# Patient Record
Sex: Male | Born: 1975 | Race: White | Hispanic: No | Marital: Married | Smoking: Former smoker
Health system: Southern US, Community
[De-identification: ages and names within clinical notes are randomized; demographics above are authoritative.]

## PROBLEM LIST (undated history)

## (undated) DIAGNOSIS — E1142 Type 2 diabetes mellitus with diabetic polyneuropathy: Secondary | ICD-10-CM

## (undated) DIAGNOSIS — T7840XA Allergy, unspecified, initial encounter: Secondary | ICD-10-CM

## (undated) DIAGNOSIS — K219 Gastro-esophageal reflux disease without esophagitis: Secondary | ICD-10-CM

## (undated) DIAGNOSIS — E785 Hyperlipidemia, unspecified: Secondary | ICD-10-CM

## (undated) DIAGNOSIS — M10079 Idiopathic gout, unspecified ankle and foot: Secondary | ICD-10-CM

## (undated) DIAGNOSIS — G43909 Migraine, unspecified, not intractable, without status migrainosus: Secondary | ICD-10-CM

## (undated) DIAGNOSIS — J309 Allergic rhinitis, unspecified: Secondary | ICD-10-CM

## (undated) HISTORY — DX: Idiopathic gout, unspecified ankle and foot: M10.079

## (undated) HISTORY — PX: ANKLE SURGERY: SHX546

## (undated) HISTORY — PX: CARPAL TUNNEL RELEASE: SHX101

## (undated) HISTORY — PX: SHOULDER SURGERY: SHX246

## (undated) HISTORY — DX: Allergic rhinitis, unspecified: J30.9

## (undated) HISTORY — DX: Gastro-esophageal reflux disease without esophagitis: K21.9

## (undated) HISTORY — PX: KNEE ARTHROSCOPY W/ ACL RECONSTRUCTION AND PATELLA GRAFT: SHX1861

## (undated) HISTORY — DX: Type 2 diabetes mellitus with diabetic polyneuropathy: E11.42

## (undated) HISTORY — DX: Allergy, unspecified, initial encounter: T78.40XA

## (undated) HISTORY — DX: Hyperlipidemia, unspecified: E78.5

## (undated) HISTORY — PX: GANGLION CYST EXCISION: SHX1691

## (undated) HISTORY — DX: Migraine, unspecified, not intractable, without status migrainosus: G43.909

---

## 1998-06-02 HISTORY — PX: APPENDECTOMY: SHX54

## 1999-02-06 ENCOUNTER — Inpatient Hospital Stay (HOSPITAL_COMMUNITY): Admission: EM | Admit: 1999-02-06 | Discharge: 1999-02-10 | Payer: Self-pay

## 1999-02-06 ENCOUNTER — Encounter (INDEPENDENT_AMBULATORY_CARE_PROVIDER_SITE_OTHER): Payer: Self-pay | Admitting: Specialist

## 1999-02-06 ENCOUNTER — Encounter (HOSPITAL_BASED_OUTPATIENT_CLINIC_OR_DEPARTMENT_OTHER): Payer: Self-pay | Admitting: General Surgery

## 1999-02-12 ENCOUNTER — Emergency Department (HOSPITAL_COMMUNITY): Admission: EM | Admit: 1999-02-12 | Discharge: 1999-02-12 | Payer: Self-pay | Admitting: Emergency Medicine

## 2004-09-14 ENCOUNTER — Emergency Department (HOSPITAL_COMMUNITY): Admission: EM | Admit: 2004-09-14 | Discharge: 2004-09-14 | Payer: Self-pay | Admitting: Family Medicine

## 2004-09-24 ENCOUNTER — Encounter: Payer: Self-pay | Admitting: Family Medicine

## 2004-11-20 ENCOUNTER — Ambulatory Visit (HOSPITAL_BASED_OUTPATIENT_CLINIC_OR_DEPARTMENT_OTHER): Admission: RE | Admit: 2004-11-20 | Discharge: 2004-11-20 | Payer: Self-pay | Admitting: Orthopedic Surgery

## 2004-11-20 ENCOUNTER — Ambulatory Visit (HOSPITAL_COMMUNITY): Admission: RE | Admit: 2004-11-20 | Discharge: 2004-11-20 | Payer: Self-pay | Admitting: Orthopedic Surgery

## 2004-12-25 ENCOUNTER — Ambulatory Visit (HOSPITAL_BASED_OUTPATIENT_CLINIC_OR_DEPARTMENT_OTHER): Admission: RE | Admit: 2004-12-25 | Discharge: 2004-12-25 | Payer: Self-pay | Admitting: Orthopedic Surgery

## 2004-12-25 ENCOUNTER — Ambulatory Visit (HOSPITAL_COMMUNITY): Admission: RE | Admit: 2004-12-25 | Discharge: 2004-12-25 | Payer: Self-pay | Admitting: Orthopedic Surgery

## 2005-07-30 ENCOUNTER — Encounter: Admission: RE | Admit: 2005-07-30 | Discharge: 2005-07-30 | Payer: Self-pay | Admitting: Family Medicine

## 2007-01-31 ENCOUNTER — Emergency Department (HOSPITAL_COMMUNITY): Admission: EM | Admit: 2007-01-31 | Discharge: 2007-01-31 | Payer: Self-pay | Admitting: Family Medicine

## 2009-06-02 HISTORY — PX: KNEE SURGERY: SHX244

## 2009-07-31 ENCOUNTER — Encounter: Admission: RE | Admit: 2009-07-31 | Discharge: 2009-07-31 | Payer: Self-pay | Admitting: Internal Medicine

## 2009-09-05 ENCOUNTER — Ambulatory Visit (HOSPITAL_COMMUNITY): Admission: RE | Admit: 2009-09-05 | Discharge: 2009-09-05 | Payer: Self-pay | Admitting: Occupational Medicine

## 2010-06-24 ENCOUNTER — Encounter: Payer: Self-pay | Admitting: Family Medicine

## 2010-08-08 ENCOUNTER — Ambulatory Visit (INDEPENDENT_AMBULATORY_CARE_PROVIDER_SITE_OTHER): Payer: PRIVATE HEALTH INSURANCE | Admitting: Family Medicine

## 2010-08-08 ENCOUNTER — Encounter: Payer: Self-pay | Admitting: Family Medicine

## 2010-08-08 DIAGNOSIS — K219 Gastro-esophageal reflux disease without esophagitis: Secondary | ICD-10-CM | POA: Insufficient documentation

## 2010-08-08 DIAGNOSIS — M545 Low back pain, unspecified: Secondary | ICD-10-CM | POA: Insufficient documentation

## 2010-08-08 DIAGNOSIS — E1142 Type 2 diabetes mellitus with diabetic polyneuropathy: Secondary | ICD-10-CM

## 2010-08-08 DIAGNOSIS — J309 Allergic rhinitis, unspecified: Secondary | ICD-10-CM | POA: Insufficient documentation

## 2010-08-08 DIAGNOSIS — G43909 Migraine, unspecified, not intractable, without status migrainosus: Secondary | ICD-10-CM | POA: Insufficient documentation

## 2010-08-08 DIAGNOSIS — M549 Dorsalgia, unspecified: Secondary | ICD-10-CM

## 2010-08-08 DIAGNOSIS — E119 Type 2 diabetes mellitus without complications: Secondary | ICD-10-CM

## 2010-08-08 DIAGNOSIS — J45909 Unspecified asthma, uncomplicated: Secondary | ICD-10-CM | POA: Insufficient documentation

## 2010-08-08 HISTORY — DX: Type 2 diabetes mellitus with diabetic polyneuropathy: E11.42

## 2010-08-12 ENCOUNTER — Encounter: Payer: Self-pay | Admitting: Family Medicine

## 2010-08-13 NOTE — Letter (Signed)
Summary: [facility] Records  [facility] Records   Imported By: Beau Fanny 08/06/2010 09:15:43  _____________________________________________________________________  External Attachment:    Type:   Image     Comment:   External Document

## 2010-08-13 NOTE — Assessment & Plan Note (Signed)
Summary: NEW PT TO EST/DIABETES/CLE  [facility],MAILED NPP   Vital Signs:  Patient profile:   35 year old male Height:      67.5 inches Weight:      241.25 pounds BMI:     37.36 Temp:     98.8 degrees F oral Pulse rate:   76 / minute Pulse rhythm:   regular BP sitting:   120 / 78  (left arm) Cuff size:   regular  Vitals Entered By: Benny Lennert CMA Duncan Dull) (August 08, 2010 2:01 PM)  History of Present Illness: Chief complaint new patient to be established   DM: new onset, dx 2 weeks ago went to MD, Dr. Clovis Riley for back pain eval, taking Voltaren. found glucose in urine then BS > 500. Now on Metformin 500 mg by mouth two times a day and attempting to alter his diet.  diagnosed with DM 2 weeks ago.  Saw Lupe Carney  a lot of urine glucose, called around 4 and a1c at 563. 190-300  Back, pain, improving somewhat, taking Voltaren now bothering him little.  s/p recent knee surgery by Dr. Yisroel Ramming  Preventive Screening-Counseling & Management  Alcohol-Tobacco     Smoking Status: never  Caffeine-Diet-Exercise     Does Patient Exercise: no      Drug Use:  no.    Allergies (verified): 1)  ! Codeine  Past History:  Past Medical History: ALLERGIC RHINITIS (ICD-477.9) GERD (ICD-530.81) MIGRAINE HEADACHE (ICD-346.90) DM (ICD-250.00) ASTHMA (ICD-493.90)    Past Surgical History: knee surgery 2011 [facility]) Carpal tunnel  appendix 2000  Family History: Family History of Alcoholism/Addiction Family History Depression Family History Diabetes 1st degree relative Family History of Cardiovascular disorder  Social History: Occupation: Married Never Smoked Alcohol use-yes Drug use-no Regular exercise-no Occupation:  employed Smoking Status:  never Drug Use:  no Does Patient Exercise:  no  Review of Systems General:  Complains of fatigue; denies chills and fever. GI:  Complains of nausea. GU:  Complains of urinary frequency. Endo:  Complains of  excessive thirst, excessive urination, and polyuria.  Physical Exam  Additional Exam:  GEN: WDWN, NAD, Non-toxic, A & O x 3 HEENT: Atraumatic, Normocephalic. Neck supple. No masses, No LAD. Ears and Nose: No external deformity. CV: RRR, No M/G/R. No JVD. No thrill. No extra heart sounds. PULM: CTA B, no wheezes, crackles, rhonchi. No retractions. No resp. distress. No accessory muscle use. EXTR: No c/c/e NEURO: Normal gait.  PSYCH: Normally interactive. Conversant. Not depressed or anxious appearing.  Calm demeanor.     Impression & Recommendations:  Problem # 1:  DM (ICD-250.00) Assessment New >45 minutes spent in total face to face time with the patient with >50% of time spent in counselling and coordination of care: spent more than half of visit discussing and counselling he and his wife regarding new onset DM. Diet, pathophysiology, ramifications and risks long term all discussed. A1c, vaccines, eye exams reviewed. Referral to DM teaching.   We will obtain records from the patient's prior physicians: Dr. Clovis Riley and Dr. Yisroel Ramming.  His updated medication list for this problem includes:    Metformin Hcl 1000 Mg Tabs (Metformin hcl) .Marland Kitchen... 1 by mouth two times a day  Orders: Diabetic Clinic Referral (Diabetic)  Problem # 2:  BACK PAIN (ICD-724.5) Assessment: New  His updated medication list for this problem includes:    Diclofenac Sodium 75 Mg Tbec (Diclofenac sodium) .Marland Kitchen... Take one tablet two times a day  Complete Medication List: 1)  Diclofenac Sodium 75 Mg Tbec (Diclofenac sodium) .... Take one tablet two times a day 2)  Metformin Hcl 1000 Mg Tabs (Metformin hcl) .Marland Kitchen.. 1 by mouth two times a day 3)  Generic Glucometer, Dx 250.00  .... Check bs two times a day, fasting and once 2 hours after eating 4)  Generic Lancets, 250.00  .... Check bs two times a day, fasting and 1 hour after eating 5)  Generic Test Strips, 250.00  .... Check bs two times a day, fasting and 2 hours after  a meal  Patient Instructions: 1)  METFORMIN: TAKE 2 IN THE MORNING AND 1 AT NIGHT: FOR 2 WEEKS 2)  THEN: TAKE 2 TABS IN THE MORNING AND 2 TABS AT NIGHT. 3)  f/u 1 mo Prescriptions: GENERIC TEST STRIPS, 250.00 Check BS two times a day, fasting and 2 hours after a meal  #100 x 11   Entered and Authorized by:   Hannah Beat MD   Signed by:   Hannah Beat MD on 08/08/2010   Method used:   Print then Give to Patient   RxID:   [id number] GENERIC LANCETS, 250.00 Check BS two times a day, fasting and 1 hour after eating  #100 x 11   Entered and Authorized by:   Hannah Beat MD   Signed by:   Hannah Beat MD on 08/08/2010   Method used:   Print then Give to Patient   RxID:   [id number] GENERIC GLUCOMETER, DX 250.00 Check BS two times a day, fasting and once 2 hours after eating  #1 x 0   Entered and Authorized by:   Hannah Beat MD   Signed by:   Hannah Beat MD on 08/08/2010   Method used:   Print then Give to Patient   RxID:   [id number] METFORMIN HCL 1000 MG TABS (METFORMIN HCL) 1 by mouth two times a day  #60 x 2   Entered and Authorized by:   Hannah Beat MD   Signed by:   Hannah Beat MD on 08/08/2010   Method used:   Print then Give to Patient   RxID:   [id number]    Orders Added: 1)  Diabetic Clinic Referral [Diabetic] 2)  New Patient Level IV [01601]    Current Allergies (reviewed today): ! CODEINE

## 2010-08-20 NOTE — Letter (Signed)
Summary: [facility]   [facility]   Imported By: Kassie Mends 08/13/2010 10:10:50  _____________________________________________________________________  External Attachment:    Type:   Image     Comment:   External Document

## 2010-08-20 NOTE — Letter (Signed)
Summary: [facility]   [facility]   Imported By: Kassie Mends 08/13/2010 09:27:47  _____________________________________________________________________  External Attachment:    Type:   Image     Comment:   External Document

## 2010-08-29 NOTE — Letter (Signed)
Summary: [facility]   Imported By: Kassie Mends 08/19/2010 11:42:02  _____________________________________________________________________  External Attachment:    Type:   Image     Comment:   External Document

## 2010-09-11 ENCOUNTER — Encounter: Payer: Self-pay | Admitting: Family Medicine

## 2010-09-12 ENCOUNTER — Encounter: Payer: Self-pay | Admitting: Family Medicine

## 2010-09-12 ENCOUNTER — Ambulatory Visit (INDEPENDENT_AMBULATORY_CARE_PROVIDER_SITE_OTHER): Payer: PRIVATE HEALTH INSURANCE | Admitting: Family Medicine

## 2010-09-12 VITALS — BP 140/82 | HR 74 | Temp 97.9°F | Ht 66.0 in | Wt 236.1 lb

## 2010-09-12 DIAGNOSIS — R030 Elevated blood-pressure reading, without diagnosis of hypertension: Secondary | ICD-10-CM

## 2010-09-12 DIAGNOSIS — E119 Type 2 diabetes mellitus without complications: Secondary | ICD-10-CM

## 2010-09-12 MED ORDER — GLIPIZIDE 5 MG PO TABS: 5.0000 mg | ORAL_TABLET | Freq: Two times a day (BID) | ORAL | Status: DC

## 2010-09-12 NOTE — Patient Instructions (Signed)
F/u 3 months  Labs a few days before 

## 2010-09-12 NOTE — Progress Notes (Signed)
35 year old male:  Feeling a lot better. Not able to exercise at all. Cut out potatoes, bread, candy, chocolate, grilled and baked.   DM teaching - could not go (cost)  BP, 110/70 on my recheck, has machine at home and normal there  Diabetes Mellitus: Tolerating Medications: Compliance with diet: good Exercise: minimal, some walking Avg blood sugars at home: 120-220 Foot problems: none Hypoglycemia: none No nausea, vomitting, blurred vision, polyuria.  Elevated BP: 140 systolic, but normal on my recheck  The PMH, PSH, Social History, Family History, Medications, and allergies have been reviewed in [facility], and have been updated if relevant.  ROS: GEN: No acute illnesses, no fevers, chills. GI: No n/v/d, eating normally Pulm: No SOB Interactive and getting along well at home.  Otherwise, ROS is as per the HPI.  GEN: WDWN, NAD, Non-toxic, A & O x 3 HEENT: Atraumatic, Normocephalic. Neck supple. No masses, No LAD. Ears and Nose: No external deformity. CV: RRR, No M/G/R. No JVD. No thrill. No extra heart sounds. PULM: CTA B, no wheezes, crackles, rhonchi. No retractions. No resp. distress. No accessory muscle use. EXTR: No c/c/e NEURO Normal gait.  PSYCH: Normally interactive. Conversant. Not depressed or anxious appearing.  Calm demeanor.   A/P: DM: improved, not at goal, add glipizide, recheck labs in 3 mo and f/u 2. Elevated BP, keep log at home and bring in at next f/u ov

## 2010-10-18 NOTE — Op Note (Signed)
Lonnie Jones, Lonnie Jones                ACCOUNT NO.:  [id number]   MEDICAL RECORD NO.:  [id number]          PATIENT TYPE:  AMB   LOCATION:  DSC                          FACILITY:  [facility]   PHYSICIAN:  Loreta Ave, M.D. DATE OF BIRTH:  04-20-1976   DATE OF PROCEDURE:  12/25/2004  DATE OF DISCHARGE:                                 OPERATIVE REPORT   PREOPERATIVE DIAGNOSIS:  Right carpal tunnel syndrome.   POSTOPERATIVE DIAGNOSIS:  Right carpal tunnel syndrome.   OPERATION:  Right carpal tunnel release.   SURGEON:  Loreta Ave, M.D.   ASSISTANT:  Genene Churn. Denton Meek.   ANESTHESIA:  General.   ESTIMATED BLOOD LOSS:  Minimal.   TOURNIQUET TIME:  Was 40 minutes.   SPECIMENS:  None.   CULTURES:  None.   COMPLICATIONS:  None.   DRESSING:  A soft compressive bulky hand dressing and splint.   DESCRIPTION OF PROCEDURE:  The patient was brought to the operating room,  and after adequate anesthesia had been obtained, the tourniquet was applied  to the upper aspect of the right arm.  He was prepped and draped in the  usual sterile fashion.  Exsanguinated with the elevation of the Esmarch  tourniquet, inflated to 250 mmHg.  An incision in the volar aspect of the  wrist hitting slightly ulnarward at the distal wrist crease.  The skin and  subcutaneous tissue divided.  The retinaculum over the carpal tunnel was  identified and incised under direct visualization from the forearm fascia  proximally to the palmar arch distally.  The contents of the carpal tunnel  inspected.  Moderate constriction of the median nerve.  The mid-portion  improved with the carpal tunnel release and an epineurotomy.  No other  abnormalities in the canal.  The digital branches and motor branches were  identified and protected and decompressed.  Release was adequate throughout.  The wound was irrigated.  The skin was closed  with nylon.  The margins of the wound injected with Marcaine.  A sterile  compressive dressing was applied.  A bulky hand dressing and splint applied.  The tourniquet was deflated and removed.  The anesthesia was reversed.   The patient was brought to the recovery room, having tolerated the surgery  well with no complications.       DFM/MEDQ  D:  12/25/2004  T:  12/25/2004  Job:  [id number]

## 2010-10-18 NOTE — Op Note (Signed)
NAMEKRYSTIAN, Lonnie Jones                ACCOUNT NO.:  [id number]   MEDICAL RECORD NO.:  [id number]          PATIENT TYPE:  AMB   LOCATION:  DSC                          FACILITY:  [facility]   PHYSICIAN:  Loreta Ave, M.D. DATE OF BIRTH:  [birth year]   DATE OF PROCEDURE:  11/20/2004  DATE OF DISCHARGE:                                 OPERATIVE REPORT   PREOPERATIVE DIAGNOSIS:  Left carpal tunnel syndrome.   POSTOPERATIVE DIAGNOSIS:  Left carpal tunnel syndrome.   PROCEDURE:  Left carpal tunnel release.   SURGEON:  Loreta Ave, M.D.   ASSISTANT:  Zonia Kief, P.A.   ANESTHESIA:  General.   BLOOD LOSS:  Minimal.   TOURNIQUET TIME:  35 minutes.   SPECIMENS:  None.   CULTURE:  None.   COMPLICATIONS:  None.   DRESSINGS:  Soft compressive with a bulky hand dressing and splint.   PROCEDURE:  The patient brought to the operating room and placed on the  operating table in the supine position.  After adequate anesthesia had been  obtained, tourniquet applied to upper aspect of the left arm.  Prepped and  draped in the usual sterile fashion.  Exsanguinated with elevation of  Esmarch, tourniquet inflated to 250 mmHg.  A small incision over the carpal  tunnel extending slightly ulnarward at the distal wrist crease.  Skin and  subcutaneous tissue divided.  Retinaculum over the carpal tunnel identified  and incised under direct visualization from the forearm fascia proximally to  the palmar arch distally.  Contents of the canal inspected.  Marked  hourglass constriction over a length of about 1.5 cm on the median nerve  right in the middle of the canal.  Improved somewhat with carpal tunnel  release and epineurotomy but this still had a fair amount of erythema and  narrowing even after those procedures.  Digital branch, motor branch  identified, protected, decompressed.  Contents of the canal were inspected.  No other significant findings.  Wound irrigated.  Skin closed with  interrupted mattress nylon suture.  Margins of wound  injected with Marcaine.  A sterile compressive dressing applied.  A bulky  dressing and splint applied.  Tourniquet deflated and removed.  Anesthesia  reversed.  Brought to the recovery room.  Tolerated surgery well, no  complications.       DFM/MEDQ  D:  11/20/2004  T:  11/20/2004  Job:  [id number]

## 2010-11-01 ENCOUNTER — Encounter: Payer: Self-pay | Admitting: Internal Medicine

## 2010-11-01 ENCOUNTER — Ambulatory Visit (INDEPENDENT_AMBULATORY_CARE_PROVIDER_SITE_OTHER): Payer: PRIVATE HEALTH INSURANCE | Admitting: Internal Medicine

## 2010-11-01 ENCOUNTER — Telehealth: Payer: Self-pay | Admitting: *Deleted

## 2010-11-01 VITALS — BP 122/80 | HR 93 | Temp 98.2°F | Wt 232.0 lb

## 2010-11-01 DIAGNOSIS — M549 Dorsalgia, unspecified: Secondary | ICD-10-CM

## 2010-11-01 DIAGNOSIS — E119 Type 2 diabetes mellitus without complications: Secondary | ICD-10-CM

## 2010-11-01 MED ORDER — CYCLOBENZAPRINE HCL 10 MG PO TABS: 5.0000 mg | ORAL_TABLET | Freq: Every evening | ORAL | Status: AC | PRN

## 2010-11-01 MED ORDER — DICLOFENAC SODIUM 75 MG PO TBEC: 75.0000 mg | DELAYED_RELEASE_TABLET | Freq: Two times a day (BID) | ORAL | Status: DC

## 2010-11-01 NOTE — Assessment & Plan Note (Signed)
Clearly seems to be muscular Chronic aching which diclofenac really helped Now with additional spasm  P: heat      Flexeril for nighttime     Diclofenac     PT if not improving

## 2010-11-01 NOTE — Progress Notes (Signed)
  Subjective:    Patient ID: Lonnie Jones, male    DOB: May 22, 1976, 35 y.o.   MRN: [id number]  HPI "My back is killing me" From right low back up to neck Very severe--may be recurrence of past problems Not on the meds which seemed to help--this helped but he ran out Felt "great" while on this  Having knee and other pain Some numbness in right hand if it lies down---he has had CTS repair in past  Achy for a couple of weeks but seemed to injure it again while replacing tub drain Constant pain No relieving factors Worse when sitting---slightly better when moving around Has affected his sleep  Current outpatient prescriptions:glipiZIDE (GLUCOTROL) 5 MG tablet, Take 1 tablet (5 mg total) by mouth 2 (two) times daily., Disp: 60 tablet, Rfl: 11;  glucose blood test strip, 1 each by Other route 2 (two) times daily. Use as instructed , Disp: , Rfl: ;  Lancets MISC, 2 (two) times daily.  , Disp: , Rfl: ;  metFORMIN (GLUCOPHAGE) 1000 MG tablet, Take 1,000 mg by mouth 2 (two) times daily with a meal.  , Disp: , Rfl:  DISCONTD: diclofenac (VOLTAREN) 75 MG EC tablet, Take 75 mg by mouth 2 (two) times daily.  , Disp: , Rfl:   Past Medical History  Diagnosis Date  . GERD (gastroesophageal reflux disease)   . Diabetes mellitus   . Asthma   . Migraine headache   . Allergy     Past Surgical History  Procedure Date  . Appendectomy 2000  . Carpal tunnel release   . Knee surgery 2011    No family history on file.  History   Social History  . Marital Status: Married    Spouse Name: N/A    Number of Children: N/A  . Years of Education: N/A   Occupational History  . Not on file.   Social History Main Topics  . Smoking status: Never Smoker   . Smokeless tobacco: Not on file  . Alcohol Use: Yes  . Drug Use: No  . Sexually Active: Not on file   Other Topics Concern  . Not on file   Social History Narrative  . No narrative on file   Review of Systems Some stomach trouble with  diabetes meds No GI problems with NSAID   has lost 0# since last visit----some "crashing" before lunch. Measured values well below 100 Now regularly occurring over the past 2 weeks  Objective:   Physical Exam  Constitutional: He appears well-developed and well-nourished. No distress.  Musculoskeletal:       Normal back flexion Some spasm along entire right paraspinals Tight in right trapezius as well SLR negative  Neurological:       Normal strength in legs Gait normal          Assessment & Plan:

## 2010-11-01 NOTE — Patient Instructions (Signed)
Please try heat and regular walking for your back Please cut the glipizide dose in half and take 2.5mg  twice a day

## 2010-11-01 NOTE — Telephone Encounter (Signed)
Voltaren and flexeril called to the wrong pharmacy, pt had requested that meds be called to [facility] [address], instead they were called to [place]. Cancelled meds at [facility], [facility]

## 2010-11-01 NOTE — Assessment & Plan Note (Signed)
Has made sig lifestyle improvements Regular late morning hypoglycemia Will cut glipizide dose

## 2010-11-15 ENCOUNTER — Other Ambulatory Visit: Payer: Self-pay | Admitting: Family Medicine

## 2010-12-10 ENCOUNTER — Other Ambulatory Visit (INDEPENDENT_AMBULATORY_CARE_PROVIDER_SITE_OTHER): Payer: PRIVATE HEALTH INSURANCE | Admitting: Family Medicine

## 2010-12-10 DIAGNOSIS — E119 Type 2 diabetes mellitus without complications: Secondary | ICD-10-CM

## 2010-12-10 LAB — HEMOGLOBIN A1C: Hgb A1c MFr Bld: 5.8 % (ref 4.6–6.5)

## 2010-12-10 LAB — LIPID PANEL
Cholesterol: 151 mg/dL (ref 0–200)
HDL: 32.2 mg/dL — ABNORMAL LOW (ref >39.00)
Total CHOL/HDL Ratio: 5
Triglycerides: 213 mg/dL — ABNORMAL HIGH (ref 0.0–149.0)
VLDL: 42.6 mg/dL — ABNORMAL HIGH (ref 0.0–40.0)

## 2010-12-10 LAB — LDL CHOLESTEROL, DIRECT: Direct LDL: 108 mg/dL

## 2010-12-16 ENCOUNTER — Ambulatory Visit (INDEPENDENT_AMBULATORY_CARE_PROVIDER_SITE_OTHER): Payer: PRIVATE HEALTH INSURANCE | Admitting: Family Medicine

## 2010-12-16 ENCOUNTER — Encounter: Payer: Self-pay | Admitting: Family Medicine

## 2010-12-16 DIAGNOSIS — E785 Hyperlipidemia, unspecified: Secondary | ICD-10-CM | POA: Insufficient documentation

## 2010-12-16 DIAGNOSIS — E119 Type 2 diabetes mellitus without complications: Secondary | ICD-10-CM

## 2010-12-16 HISTORY — DX: Hyperlipidemia, unspecified: E78.5

## 2010-12-16 MED ORDER — PIOGLITAZONE HCL 15 MG PO TABS: 15.0000 mg | ORAL_TABLET | Freq: Every day | ORAL | Status: DC

## 2010-12-16 MED ORDER — LANCETS MISC: Status: DC

## 2010-12-16 MED ORDER — GLUCOSE BLOOD VI STRP: ORAL_STRIP | Status: DC

## 2010-12-16 NOTE — Patient Instructions (Addendum)
F/u 6 months for full CPX

## 2010-12-16 NOTE — Progress Notes (Signed)
Lonnie Jones, a 35 y.o. male presents today in the office for the following:    Fu DM and Lipids:  Diabetes Mellitus: Tolerating Medications: glipizide will sometimes drop bs Compliance with diet: good Exercise: y Avg blood sugars at home: 50-210 Foot problems: none Hypoglycemia: none No nausea, vomitting, blurred vision, polyuria.  Lab Results  Component Value Date   HGBA1C 5.8 12/10/2010   2.  Lipids: Doing well, stable. No meds Panel reviewed with patient.  Lipids:    Component Value Date/Time   CHOL 151 12/10/2010 0820   TRIG 213.0* 12/10/2010 0820   HDL 32.20* 12/10/2010 0820   LDLDIRECT 108.0 12/10/2010 0820   VLDL 42.6* 12/10/2010 0820   CHOLHDL 5 12/10/2010 0820   Patient Active Problem List  Diagnoses  . DM  . MIGRAINE HEADACHE  . ALLERGIC RHINITIS  . ASTHMA  . GERD  . BACK PAIN   Past Medical History  Diagnosis Date  . GERD (gastroesophageal reflux disease)   . Diabetes mellitus   . Asthma   . Migraine headache   . Allergy    Past Surgical History  Procedure Date  . Appendectomy 2000  . Carpal tunnel release   . Knee surgery 2011   History  Substance Use Topics  . Smoking status: Never Smoker   . Smokeless tobacco: Not on file  . Alcohol Use: Yes   No family history on file. Allergies  Allergen Reactions  . Codeine    Current Outpatient Prescriptions on File Prior to Visit  Medication Sig Dispense Refill  . diclofenac (VOLTAREN) 75 MG EC tablet Take 1 tablet (75 mg total) by mouth 2 (two) times daily.  60 tablet  2  . metFORMIN (GLUCOPHAGE) 1000 MG tablet TAKE ONE TABLET BY MOUTH TWICE DAILY  60 tablet  5   ROS: GEN: No acute illnesses, no fevers, chills. GI: No n/v/d, eating normally Pulm: No SOB Interactive and getting along well at home.  Otherwise, ROS is as per the HPI.   Physical Exam  Blood pressure 120/78, pulse 72, temperature 97.6 F (36.4 C), temperature source Oral, height 5\' 6"  (1.676 m), weight 233 lb (105.688 kg),  SpO2 98.00%.  GEN: WDWN, NAD, Non-toxic, A & O x 3 HEENT: Atraumatic, Normocephalic. Neck supple. No masses, No LAD. Ears and Nose: No external deformity. CV: RRR, No M/G/R. No JVD. No thrill. No extra heart sounds. PULM: CTA B, no wheezes, crackles, rhonchi. No retractions. No resp. distress. No accessory muscle use. EXTR: No c/c/e NEURO Normal gait.  PSYCH: Normally interactive. Conversant. Not depressed or anxious appearing.  Calm demeanor.   Assessment and Plan: 1.  DM, stable, some low BS with glipizide -- will change to actos now and see if more steady state 2. Lipids - LDL 108. Making good progress with diet. 10 pound weight loss. 25 pounds 1 in year. Cont and recheck in 1 year

## 2010-12-17 ENCOUNTER — Telehealth: Payer: Self-pay | Admitting: *Deleted

## 2010-12-17 MED ORDER — GLIPIZIDE 5 MG PO TABS: 5.0000 mg | ORAL_TABLET | Freq: Two times a day (BID) | ORAL | Status: DC

## 2010-12-17 NOTE — Telephone Encounter (Signed)
Pt was seen yesterday and prescribed actos.  Wife states this is too expensive and pt would like something less expensive called to [facility] [address].  He said he would rather go back on what he was taking than pay for the actos.

## 2010-12-17 NOTE — Telephone Encounter (Signed)
Call --- I want you to call and speak to this patient directly, not a message and not on phone tree  Have him restart the glipizide bid

## 2010-12-17 NOTE — Telephone Encounter (Signed)
Patient advised.

## 2011-01-12 ENCOUNTER — Other Ambulatory Visit: Payer: Self-pay | Admitting: Internal Medicine

## 2011-03-14 LAB — DIFFERENTIAL
Basophils Absolute: 0
Basophils Relative: 0
Eosinophils Absolute: 0.3
Eosinophils Relative: 4
Lymphocytes Relative: 28
Lymphs Abs: 2.1
Monocytes Absolute: 0.5
Monocytes Relative: 7
Neutro Abs: 4.6
Neutrophils Relative %: 61

## 2011-03-14 LAB — CBC
HCT: 47.6
Hemoglobin: 16.6
MCHC: 34.9
MCV: 88.9
Platelets: 239
RBC: 5.35
RDW: 12.9
WBC: 7.5

## 2011-05-12 ENCOUNTER — Other Ambulatory Visit: Payer: Self-pay | Admitting: Family Medicine

## 2011-05-31 ENCOUNTER — Other Ambulatory Visit: Payer: Self-pay | Admitting: Family Medicine

## 2011-06-11 ENCOUNTER — Other Ambulatory Visit (INDEPENDENT_AMBULATORY_CARE_PROVIDER_SITE_OTHER): Payer: PRIVATE HEALTH INSURANCE

## 2011-06-11 DIAGNOSIS — E119 Type 2 diabetes mellitus without complications: Secondary | ICD-10-CM

## 2011-06-11 LAB — HEMOGLOBIN A1C: Hgb A1c MFr Bld: 5 % (ref 4.6–6.5)

## 2011-06-11 LAB — MICROALBUMIN / CREATININE URINE RATIO
Creatinine,U: 70.1 mg/dL
Microalb Creat Ratio: 0.7 mg/g (ref 0.0–30.0)
Microalb, Ur: 0.5 mg/dL (ref 0.0–1.9)

## 2011-06-18 ENCOUNTER — Ambulatory Visit (INDEPENDENT_AMBULATORY_CARE_PROVIDER_SITE_OTHER): Payer: PRIVATE HEALTH INSURANCE | Admitting: Family Medicine

## 2011-06-18 ENCOUNTER — Encounter: Payer: Self-pay | Admitting: Family Medicine

## 2011-06-18 VITALS — BP 120/72 | HR 73 | Temp 98.0°F | Ht 68.5 in | Wt 224.0 lb

## 2011-06-18 DIAGNOSIS — E785 Hyperlipidemia, unspecified: Secondary | ICD-10-CM

## 2011-06-18 DIAGNOSIS — E119 Type 2 diabetes mellitus without complications: Secondary | ICD-10-CM

## 2011-06-18 DIAGNOSIS — J019 Acute sinusitis, unspecified: Secondary | ICD-10-CM

## 2011-06-18 DIAGNOSIS — M67919 Unspecified disorder of synovium and tendon, unspecified shoulder: Secondary | ICD-10-CM

## 2011-06-18 DIAGNOSIS — M7580 Other shoulder lesions, unspecified shoulder: Secondary | ICD-10-CM

## 2011-06-18 DIAGNOSIS — Z0000 Encounter for general adult medical examination without abnormal findings: Secondary | ICD-10-CM

## 2011-06-18 MED ORDER — AMOXICILLIN-POT CLAVULANATE 875-125 MG PO TABS: 1.0000 | ORAL_TABLET | Freq: Two times a day (BID) | ORAL | Status: AC

## 2011-06-18 MED ORDER — DICLOFENAC SODIUM 75 MG PO TBEC: 75.0000 mg | DELAYED_RELEASE_TABLET | Freq: Two times a day (BID) | ORAL | Status: DC

## 2011-06-18 MED ORDER — PREDNISONE 20 MG PO TABS: ORAL_TABLET | ORAL | Status: DC

## 2011-06-18 NOTE — Patient Instructions (Signed)
[email or web address] ALL SHOULDER VIDEOS AND WORKOUT DO 3-4 TIMES A WEEK   

## 2011-06-18 NOTE — Progress Notes (Signed)
Patient Name: Lonnie Jones Date of Birth: 1975/06/06 Medical Record Number: [id number] Gender: male Date of Encounter: 06/18/2011  History of Present Illness:  Lonnie Jones is a 36 y.o. very pleasant male patient who presents with the following:  Preventative Health Maintenance Visit:  Health Maintenance Summary Reviewed and updated, unless pt declines services.  Tobacco History Reviewed. Alcohol: No concerns, no excessive use Exercise Habits: rare STD concerns: no risk or activity to increase risk Drug Use: None Encouraged self-testicular check  Health Maintenance  Topic Date Due  . Tetanus/tdap  05/19/1995  . Influenza Vaccine  03/03/2011  td in 2006  Labs reviewed with the patient.   Lipids:    Component Value Date/Time   CHOL 151 12/10/2010 0820   TRIG 213.0* 12/10/2010 0820   HDL 32.20* 12/10/2010 0820   LDLDIRECT 108.0 12/10/2010 0820   VLDL 42.6* 12/10/2010 0820   CHOLHDL 5 12/10/2010 0820    CBC:    Component Value Date/Time   WBC 7.5 01/31/2007 1243   HGB 16.6 01/31/2007 1243   HCT 47.6 01/31/2007 1243   PLT 239 01/31/2007 1243   MCV 88.9 01/31/2007 1243   NEUTROABS 4.6 01/31/2007 1243   LYMPHSABS 2.1 01/31/2007 1243   MONOABS 0.5 01/31/2007 1243   EOSABS 0.3 01/31/2007 1243   BASOSABS 0.0 01/31/2007 1243     Sick for 2 months -- claritin months. Sinus infection symptoms.  Cant sleep. Facial pain, pain in upper jaw  L shoulder --- hurting back since working. Has a distant collarbone injury. Pain with abduction and rotation. Mostly with work.   No real exercise  Diabetes Mellitus: Tolerating Medications: yes Compliance with diet: good Exercise: none Avg blood sugars at home: some low bs Foot problems: none Hypoglycemia: none Sometimes low bs -- almost daily  Lab Results  Component Value Date   HGBA1C 5.0 06/11/2011    Wt Readings from Last 3 Encounters:  06/18/11 224 lb (101.606 kg)  12/16/10 233 lb (105.688 kg)  11/01/10 232 lb (105.235 kg)     Body mass index is 33.56 kg/(m^2).   Patient Active Problem List  Diagnoses  . DM  . MIGRAINE HEADACHE  . ALLERGIC RHINITIS  . ASTHMA  . GERD  . Hyperlipidemia LDL goal < 70   Past Medical History  Diagnosis Date  . GERD (gastroesophageal reflux disease)   . Diabetes mellitus   . Asthma   . Migraine headache   . Allergy   . Hyperlipidemia LDL goal < 70 12/16/2010   Past Surgical History  Procedure Date  . Appendectomy 2000  . Carpal tunnel release   . Knee surgery 2011   History  Substance Use Topics  . Smoking status: Never Smoker   . Smokeless tobacco: Not on file  . Alcohol Use: Yes   No family history on file. Allergies  Allergen Reactions  . Codeine     Medication list has been reviewed and updated.  Review of Systems:  General:above Eyes: Denies blurring,significant itching [id number]: Denies chest pains, palpitations, dyspnea on exertion Respiratory: Denies cough, dyspnea at rest,wheeezing Breast: no concerns about lumps GI: Denies nausea, vomiting, diarrhea, constipation, change in bowel habits, abdominal pain, melena, hematochezia GU: Denies penile discharge, ED, urinary flow / outflow problems. No STD concerns. Musculoskeletal: above Derm: Denies rash, itching Neuro: Denies  paresthesias, frequent falls, frequent headaches Psych: Denies depression, anxiety Endocrine: Denies cold intolerance, heat intolerance, polydipsia Heme: Denies enlarged lymph nodes Allergy: No hayfever   Physical Examination: Filed Vitals:  06/18/11 0829  BP: 120/72  Pulse: 73  Temp: 98 F (36.7 C)  TempSrc: Oral  Height: 5' 8.5" (1.74 m)  Weight: 224 lb (101.606 kg)  SpO2: 98%    Body mass index is 33.56 kg/(m^2).   Wt Readings from Last 3 Encounters:  06/18/11 224 lb (101.606 kg)  12/16/10 233 lb (105.688 kg)  11/01/10 232 lb (105.235 kg)    GEN: well developed, well nourished, no acute distress Eyes: conjunctiva and lids normal,  PERRLA, EOMI ENT: TM clear, nares clear, oral exam WNL. Sinuses mildly tender Neck: supple, no lymphadenopathy, no thyromegaly, no JVD Pulm: clear to auscultation and percussion, respiratory effort normal CV: regular rate and rhythm, S1-S2, no murmur, rub or gallop, no bruits, peripheral pulses normal and symmetric, no cyanosis, clubbing, edema or varicosities Chest: no scars, masses GI: soft, non-tender; no hepatosplenomegaly, masses; active bowel sounds all quadrants GU: no hernia, testicular mass, penile discharge, or prostate enlargement Lymph: no cervical, axillary or inguinal adenopathy MSK: gait normal, muscle tone and strength WNL, no joint swelling, effusions, discoloration, crepitus   Shoulder: L Inspection: No muscle wasting or winging Ecchymosis/edema: neg  AC joint, scapula, clavicle: NT Cervical spine: NT, full ROM Spurling's: neg Abduction: full, 5/5 Flexion: full, 5/5 IR, full, lift-off: 5/5 ER at neutral: full, 5/5 AC crossover: neg Neer: pos Hawkins: pos Drop Test: neg Empty Can: pos Supraspinatus insertion: mild-mod T Bicipital groove: NT Speed's: neg Yergason's: neg Sulcus sign: neg Scapular dyskinesis: none C5-T1 intact  Neuro: Sensation intact Grip 5/5   SKIN: clear, good turgor, color WNL, no rashes, lesions, or ulcerations Neuro: normal mental status, normal strength, sensation, and motion Psych: alert; oriented to person, place and time, normally interactive and not anxious or depressed in appearance.   Assessment and Plan: 1. Routine general medical examination at a health care facility   2. DM   3. Hyperlipidemia LDL goal < 70   4. Rotator cuff tendinitis     The patient's preventative maintenance and recommended screening tests for an annual wellness exam were reviewed in full today. Brought up to date unless services declined.  Counselled on the importance of diet, exercise, and its role in overall health and mortality. The patient's  FH and SH was reviewed, including their home life, tobacco status, and drug and alcohol status.   DM: doing well, stop glipizide with low bs. Recheck 3 mo Cuff: oral prednisone, rtc and scap stab Sinusitis: augmentin and prednisone likely will also help open up

## 2011-07-30 ENCOUNTER — Encounter: Payer: Self-pay | Admitting: Family Medicine

## 2011-07-30 ENCOUNTER — Ambulatory Visit (INDEPENDENT_AMBULATORY_CARE_PROVIDER_SITE_OTHER): Payer: PRIVATE HEALTH INSURANCE | Admitting: Family Medicine

## 2011-07-30 ENCOUNTER — Ambulatory Visit (INDEPENDENT_AMBULATORY_CARE_PROVIDER_SITE_OTHER)
Admission: RE | Admit: 2011-07-30 | Discharge: 2011-07-30 | Disposition: A | Discharge status: Home / Self Care | Payer: PRIVATE HEALTH INSURANCE | Source: Ambulatory Visit | Attending: Family Medicine | Admitting: Family Medicine

## 2011-07-30 VITALS — BP 140/90 | HR 116 | Temp 98.0°F | Ht 67.0 in | Wt 225.1 lb

## 2011-07-30 DIAGNOSIS — M7550 Bursitis of unspecified shoulder: Secondary | ICD-10-CM

## 2011-07-30 DIAGNOSIS — M25519 Pain in unspecified shoulder: Secondary | ICD-10-CM

## 2011-07-30 DIAGNOSIS — M75100 Unspecified rotator cuff tear or rupture of unspecified shoulder, not specified as traumatic: Secondary | ICD-10-CM

## 2011-07-30 DIAGNOSIS — M25512 Pain in left shoulder: Secondary | ICD-10-CM

## 2011-07-30 DIAGNOSIS — IMO0002 Reserved for concepts with insufficient information to code with codable children: Secondary | ICD-10-CM

## 2011-07-30 NOTE — Progress Notes (Signed)
  Patient Name: Lonnie Jones Date of Birth: 04-Aug-1975 Age: 36 y.o. Medical Record Number: [id number] Gender: male Date of Encounter: 07/30/2011  History of Present Illness:  Lonnie Jones is a 36 y.o. very pleasant male patient who presents with the following:  Pulse 85  To 3 month history of LEFT lateral shoulder pain. The patient is highly active. He is fully strong in all branches. Range of motion is full. No traumatic dislocation, subluxation, history of fracture, or operative intervention. He has been taking some anti-inflammatories and doing some home rehabilitation. Is not really improved significantly.  Pain posteriorly, no popping, no creaking. Pulling without problems, then it will ache lateraly.  And with driving.   Past Medical History, Surgical History, Social History, Family History, Problem List, Medications, and Allergies have been reviewed and updated if relevant.  Review of Systems:  GEN: No fevers, chills. Nontoxic. Primarily MSK c/o today. MSK: Detailed in the HPI GI: tolerating PO intake without difficulty Neuro: No numbness, parasthesias, or tingling associated. Otherwise the pertinent positives of the ROS are noted above.    Physical Examination: Filed Vitals:   07/30/11 1603  BP: 140/90  Pulse: 116  Temp: 98 F (36.7 C)  TempSrc: Oral  Height: 5\' 7"  (1.702 m)  Weight: 225 lb 1.9 oz (102.114 kg)  SpO2: 99%    Body mass index is 35.26 kg/(m^2).   GEN: Well-developed,well-nourished,in no acute distress; alert,appropriate and cooperative throughout examination HEENT: Normocephalic and atraumatic without obvious abnormalities. Ears, externally no deformities PULM: Breathing comfortably in no respiratory distress EXT: No clubbing, cyanosis, or edema PSYCH: Normally interactive. Cooperative during the interview. Pleasant. Friendly and conversant. Not anxious or depressed appearing. Normal, full affect.  Shoulder: L Inspection: No muscle wasting or  winging Ecchymosis/edema: neg  AC joint, scapula, clavicle: NT Cervical spine: NT, full ROM Spurling's: neg Abduction: full, 5/5 Flexion: full, 5/5 IR, full, lift-off: 5/5 ER at neutral: full, 5/5 AC crossover: neg Neer: pos Hawkins: pos Drop Test: neg Empty Can: pos Supraspinatus insertion: mild-mod T Bicipital groove: NT Speed's: neg Yergason's: neg Sulcus sign: neg Scapular dyskinesis: none C5-T1 intact  Neuro: Sensation intact Grip 5/5   Assessment and Plan: 1. Subacromial bursitis    2. Shoulder pain, left  DG Shoulder Left    Minimal a.c. Joint pain and subacromial bursitis. He is going to continue his home rehabilitation, rotator cuff strengthening, and status post embolization. Her manager directed subacromial injection of some corticosteroid to try to calm him down. He will do the best he can with limiting his provokable motion while at work.  SubAC Injection Verbal consent was obtained from the patient. Risks (including infection), benefits, and alternatives were explained. Patient prepped with Chloraprep and Ethyl Chloride used for anesthesia. The subacromial space was injected using the posterior approach. The patient tolerated the procedure well and had decreased pain post injection. No complications. Injection: 9 cc of Lidocaine 1% and 1cc of Depo-Medrol 40 mg. Needle: 22 gauge

## 2011-07-30 NOTE — Patient Instructions (Signed)
After you have an injection of any joint, the numbing medicine will make it feel better for a few hours. Later tonight, it is common for the joint to feel worse. The steroid will take 48-72 hours to start working - it is the thing that will likely provide the most relief.  Ice the joint where you had the injection at least 2-3 times a day for 20 minutes for 3 days. If have had swelling, pain in the joint itself, ice for 1 week.  You can use an ice bag, frozen peas or corn, an ice pack - all work  

## 2011-08-01 ENCOUNTER — Ambulatory Visit: Payer: PRIVATE HEALTH INSURANCE | Admitting: Family Medicine

## 2011-08-25 ENCOUNTER — Ambulatory Visit (INDEPENDENT_AMBULATORY_CARE_PROVIDER_SITE_OTHER): Payer: PRIVATE HEALTH INSURANCE | Admitting: Family Medicine

## 2011-08-25 ENCOUNTER — Encounter: Payer: Self-pay | Admitting: Family Medicine

## 2011-08-25 VITALS — BP 120/70 | HR 80 | Temp 97.9°F | Ht 68.0 in | Wt 222.0 lb

## 2011-08-25 DIAGNOSIS — J0100 Acute maxillary sinusitis, unspecified: Secondary | ICD-10-CM

## 2011-08-25 DIAGNOSIS — J309 Allergic rhinitis, unspecified: Secondary | ICD-10-CM

## 2011-08-25 DIAGNOSIS — H10029 Other mucopurulent conjunctivitis, unspecified eye: Secondary | ICD-10-CM

## 2011-08-25 HISTORY — DX: Allergic rhinitis, unspecified: J30.9

## 2011-08-25 MED ORDER — FLUTICASONE PROPIONATE 50 MCG/ACT NA SUSP: 2.0000 | Freq: Every day | NASAL | Status: DC

## 2011-08-25 MED ORDER — POLYMYXIN B-TRIMETHOPRIM 10000-0.1 UNIT/ML-% OP SOLN: 1.0000 [drp] | OPHTHALMIC | Status: AC

## 2011-08-25 MED ORDER — AMOXICILLIN-POT CLAVULANATE 875-125 MG PO TABS: 1.0000 | ORAL_TABLET | Freq: Two times a day (BID) | ORAL | Status: AC

## 2011-08-25 NOTE — Progress Notes (Signed)
  Patient Name: Lonnie Jones Date of Birth: [birth year] Age: 36 y.o. Medical Record Number: [id number] Gender: male Date of Encounter: 08/25/2011  History of Present Illness:  Lonnie Jones is a 36 y.o. very pleasant male patient who presents with the following:  Pleasant gentleman whose eyes started to feel scratchy, LEFT greater than RIGHT yesterday, now he has crusting and some pinkish coloration in his LEFT sclera.  Additionally, he has been having some sinus pressure and pain for greater than a month. I saw him for a physical about a month ago, and gave him some antibiotics. At that time he did feel better, but after he completed his antibiotics his symptoms then came back and now he has some pain and around his sinus. He does also have a history of some allergies, and thinks that these are flared up now with some nasal dripping and postnasal drainage.  Past Medical History, Surgical History, Social History, Family History, Problem List, Medications, and Allergies have been reviewed and updated if relevant.  Review of Systems: ROS: GEN: Acute illness details above GI: Tolerating PO intake GU: maintaining adequate hydration and urination Pulm: No SOB Interactive and getting along well at home.  Otherwise, ROS is as per the HPI.   Physical Examination: Filed Vitals:   08/25/11 1132  BP: 120/70  Pulse: 80  Temp: 97.9 F (36.6 C)  TempSrc: Oral  Height: 5\' 8"  (1.727 m)  Weight: 222 lb (100.699 kg)  SpO2: 98%    Body mass index is 33.76 kg/(m^2).   Gen: WDWN, NAD; alert,appropriate and cooperative throughout exam  HEENT: Normocephalic and atraumatic. Eyes with injected sclera bilaterally, LEFT greater than RIGHT. Throat clear, w/o exudate, no LAD, R TM clear, L TM - good landmarks, No fluid present. rhinnorhea.  Left frontal and maxillary sinuses: Tender, max  Right frontal and maxillary sinuses: Tendermax  Neck: No ant or post LAD CV: RRR, No M/G/R Pulm: Breathing  comfortably in no resp distress. no w/c/r Abd: S,NT,ND,+BS Extr: no c/c/e Psych: full affect, pleasant    Assessment and Plan:  1. Pink eye  trimethoprim-polymyxin b (POLYTRIM) ophthalmic solution  2. Allergic rhinitis due to allergen  fluticasone (FLONASE) 50 MCG/ACT nasal spray  3. Sinusitis, acute maxillary  amoxicillin-clavulanate (AUGMENTIN) 875-125 MG per tablet   Precautions regarding pink eye.  Sinusitis with treatment failure from initial antibody.  Some of this may be due to poorly controlled allergies. We'll start him on Flonase.  Orders Today: No orders of the defined types were placed in this encounter.    Medications Today: Meds ordered this encounter  Medications  . fluticasone (FLONASE) 50 MCG/ACT nasal spray    Sig: Place 2 sprays into the nose daily.    Dispense:  16 g    Refill:  12  . amoxicillin-clavulanate (AUGMENTIN) 875-125 MG per tablet    Sig: Take 1 tablet by mouth 2 (two) times daily.    Dispense:  20 tablet    Refill:  0  . trimethoprim-polymyxin b (POLYTRIM) ophthalmic solution    Sig: Place 1 drop into both eyes every 4 (four) hours.    Dispense:  10 mL    Refill:  0

## 2011-09-10 ENCOUNTER — Other Ambulatory Visit: Payer: Self-pay | Admitting: Family Medicine

## 2011-09-10 DIAGNOSIS — E785 Hyperlipidemia, unspecified: Secondary | ICD-10-CM

## 2011-09-15 ENCOUNTER — Other Ambulatory Visit (INDEPENDENT_AMBULATORY_CARE_PROVIDER_SITE_OTHER): Payer: PRIVATE HEALTH INSURANCE

## 2011-09-15 DIAGNOSIS — E785 Hyperlipidemia, unspecified: Secondary | ICD-10-CM

## 2011-09-15 LAB — LIPID PANEL
Cholesterol: 135 mg/dL (ref 0–200)
HDL: 26.9 mg/dL — ABNORMAL LOW (ref >39.00)
LDL Cholesterol: 91 mg/dL (ref 0–99)
Total CHOL/HDL Ratio: 5
Triglycerides: 88 mg/dL (ref 0.0–149.0)
VLDL: 17.6 mg/dL (ref 0.0–40.0)

## 2011-09-17 ENCOUNTER — Ambulatory Visit (INDEPENDENT_AMBULATORY_CARE_PROVIDER_SITE_OTHER): Payer: PRIVATE HEALTH INSURANCE | Admitting: Family Medicine

## 2011-09-17 ENCOUNTER — Encounter: Payer: Self-pay | Admitting: Family Medicine

## 2011-09-17 VITALS — BP 130/90 | HR 67 | Temp 98.5°F | Ht 68.0 in | Wt 215.0 lb

## 2011-09-17 DIAGNOSIS — E785 Hyperlipidemia, unspecified: Secondary | ICD-10-CM

## 2011-09-17 DIAGNOSIS — E119 Type 2 diabetes mellitus without complications: Secondary | ICD-10-CM

## 2011-09-17 MED ORDER — METFORMIN HCL 1000 MG PO TABS: 1000.0000 mg | ORAL_TABLET | Freq: Two times a day (BID) | ORAL | Status: DC

## 2011-09-17 NOTE — Progress Notes (Signed)
  Patient Name: Lonnie Jones Date of Birth: Oct 21, 1975 Age: 36 y.o. Medical Record Number: [id number] Gender: male Date of Encounter: 09/17/2011  History of Present Illness:  Lonnie Jones is a 36 y.o. very pleasant male patient who presents with the following:  Wt Readings from Last 3 Encounters:  09/17/11 215 lb (97.523 kg)  08/25/11 222 lb (100.699 kg)  07/30/11 225 lb 1.9 oz (102.114 kg)   Never above 140  Lipids: Doing well, stable. Tolerating meds fine with no SE. Panel reviewed with patient.  Lipids:    Component Value Date/Time   CHOL 135 09/15/2011 0855   TRIG 88.0 09/15/2011 0855   HDL 26.90* 09/15/2011 0855   LDLDIRECT 108.0 12/10/2010 0820   VLDL 17.6 09/15/2011 0855   CHOLHDL 5 09/15/2011 0855   Diabetes Mellitus: Tolerating Medications: yes Compliance with diet: excellent Exercise: y Avg blood sugars at home: 100-130 Foot problems: none Hypoglycemia: none No nausea, vomitting, blurred vision, polyuria.  Lab Results  Component Value Date   HGBA1C 5.0 06/11/2011    Wt Readings from Last 3 Encounters:  09/17/11 215 lb (97.523 kg)  08/25/11 222 lb (100.699 kg)  07/30/11 225 lb 1.9 oz (102.114 kg)    Body mass index is 32.69 kg/(m^2).   Past Medical History, Surgical History, Social History, Family History, Problem List, Medications, and Allergies have been reviewed and updated if relevant.  Review of Systems:  GEN: No acute illnesses, no fevers, chills. GI: No n/v/d, eating normally Pulm: No SOB Interactive and getting along well at home.  Otherwise, ROS is as per the HPI.   Physical Examination: Filed Vitals:   09/17/11 0814  BP: 130/90  Pulse: 67  Temp: 98.5 F (36.9 C)  TempSrc: Oral  Height: 5\' 8"  (1.727 m)  Weight: 215 lb (97.523 kg)  SpO2: 97%    Body mass index is 32.69 kg/(m^2).   GEN: WDWN, NAD, Non-toxic, A & O x 3 HEENT: Atraumatic, Normocephalic. Neck supple. No masses, No LAD. Ears and Nose: No external deformity. CV:  RRR, No M/G/R. No JVD. No thrill. No extra heart sounds. PULM: CTA B, no wheezes, crackles, rhonchi. No retractions. No resp. distress. No accessory muscle use. EXTR: No c/c/e NEURO Normal gait.  PSYCH: Normally interactive. Conversant. Not depressed or anxious appearing.  Calm demeanor.    Assessment and Plan: DM:, stable, refill metformin Lipids: much improved with diet

## 2011-09-17 NOTE — Patient Instructions (Signed)
F/u 6 months

## 2012-01-12 ENCOUNTER — Telehealth: Payer: Self-pay | Admitting: *Deleted

## 2012-01-12 NOTE — Telephone Encounter (Addendum)
Received a faxed form from pharmacy requesting verification on directions for Metformin. Form shows Metformin 1000 mg, take one tablet 3 times a day. Note shows that patient advised them that he is taking it two times a day. Called and spoke to Rob at [facility] and advised him that the medication sheet does show two times a day and script was sent to [facility] on 09/17/11 with #180 and 3 refills. Rob stated that when he had the script transferred that [facility] told them he was taking it three times a day. Rob stated that he will make the correction based on the script dated 09/17/11.

## 2012-01-12 NOTE — Telephone Encounter (Signed)
It should be bid.

## 2012-03-10 ENCOUNTER — Other Ambulatory Visit: Payer: Self-pay | Admitting: Family Medicine

## 2012-03-10 DIAGNOSIS — E119 Type 2 diabetes mellitus without complications: Secondary | ICD-10-CM

## 2012-03-15 ENCOUNTER — Other Ambulatory Visit (INDEPENDENT_AMBULATORY_CARE_PROVIDER_SITE_OTHER): Payer: PRIVATE HEALTH INSURANCE

## 2012-03-15 DIAGNOSIS — E119 Type 2 diabetes mellitus without complications: Secondary | ICD-10-CM

## 2012-03-15 DIAGNOSIS — E785 Hyperlipidemia, unspecified: Secondary | ICD-10-CM

## 2012-03-15 LAB — LIPID PANEL
Cholesterol: 168 mg/dL (ref 0–200)
HDL: 35.5 mg/dL — ABNORMAL LOW (ref >39.00)
LDL Cholesterol: 97 mg/dL (ref 0–99)
Total CHOL/HDL Ratio: 5
Triglycerides: 179 mg/dL — ABNORMAL HIGH (ref 0.0–149.0)
VLDL: 35.8 mg/dL (ref 0.0–40.0)

## 2012-03-15 LAB — HEMOGLOBIN A1C: Hgb A1c MFr Bld: 5.4 % (ref 4.6–6.5)

## 2012-03-18 ENCOUNTER — Ambulatory Visit: Payer: PRIVATE HEALTH INSURANCE | Admitting: Family Medicine

## 2012-03-22 ENCOUNTER — Ambulatory Visit (INDEPENDENT_AMBULATORY_CARE_PROVIDER_SITE_OTHER): Payer: PRIVATE HEALTH INSURANCE | Admitting: Family Medicine

## 2012-03-22 ENCOUNTER — Encounter: Payer: Self-pay | Admitting: Family Medicine

## 2012-03-22 VITALS — BP 114/70 | HR 68 | Temp 97.9°F | Wt 223.5 lb

## 2012-03-22 DIAGNOSIS — E785 Hyperlipidemia, unspecified: Secondary | ICD-10-CM

## 2012-03-22 DIAGNOSIS — E119 Type 2 diabetes mellitus without complications: Secondary | ICD-10-CM

## 2012-03-22 NOTE — Progress Notes (Signed)
Nature conservation officer at [facility] [address] [place] [address] Phone: [phone] Fax: [phone]  Date:  03/22/2012   Name:  Lonnie Jones   DOB:  June 26, 1975   MRN:  [id number] Gender: male Age: 36 y.o.  PCP:  Hannah Beat, MD  Evaluating MD: Hannah Beat, MD   Chief Complaint: Follow-up   History of Present Illness:  Lonnie Jones is a 36 y.o. pleasant patient who presents with the following:  Diabetes Mellitus: Tolerating Medications: yes Compliance with diet: fair Exercise: none Avg blood sugars at home: ok Foot problems: none Hypoglycemia: none No nausea, vomitting, blurred vision, polyuria.  Lab Results  Component Value Date   HGBA1C 5.4 03/15/2012    Wt Readings from Last 3 Encounters:  03/22/12 223 lb 8 oz (101.379 kg)  09/17/11 215 lb (97.523 kg)  08/25/11 222 lb (100.699 kg)    There is no height on file to calculate BMI.   Lipids: worsened somewhat - gained a little weight back Lipids:    Component Value Date/Time   CHOL 168 03/15/2012 0947   TRIG 179.0* 03/15/2012 0947   HDL 35.50* 03/15/2012 0947   LDLDIRECT 108.0 12/10/2010 0820   VLDL 35.8 03/15/2012 0947   CHOLHDL 5 03/15/2012 0947    No results found for this basename: ALT, AST, GGT, ALKPHOS, BILITOT     Patient Active Problem List  Diagnosis  . DM  . MIGRAINE HEADACHE  . ALLERGIC RHINITIS  . ASTHMA  . GERD  . Hyperlipidemia LDL goal < 70  . Allergic rhinitis due to allergen    Past Medical History  Diagnosis Date  . GERD (gastroesophageal reflux disease)   . Diabetes mellitus   . Asthma   . Migraine headache   . Allergy   . Hyperlipidemia LDL goal < 70 12/16/2010  . Allergic rhinitis due to allergen 08/25/2011    Past Surgical History  Procedure Date  . Appendectomy 2000  . Carpal tunnel release   . Knee surgery 2011    History  Substance Use Topics  . Smoking status: Former Games developer  . Smokeless tobacco: Current User    Types: Snuff   Comment: quit 2003  . Alcohol Use: No    No family history on file.  Allergies  Allergen Reactions  . Codeine     Medication list has been reviewed and updated.  Outpatient Prescriptions Prior to Visit  Medication Sig Dispense Refill  . diclofenac (VOLTAREN) 75 MG EC tablet Take 1 tablet (75 mg total) by mouth 2 (two) times daily.  180 tablet  3  . fluticasone (FLONASE) 50 MCG/ACT nasal spray Place 2 sprays into the nose daily.  16 g  12  . glucose blood test strip Check BS three times a day  100 each  11  . Lancets MISC Check BS three times a day (250.00)  100 each  11  . metFORMIN (GLUCOPHAGE) 1000 MG tablet Take 1 tablet (1,000 mg total) by mouth 2 (two) times daily with a meal.  180 tablet  3    Review of Systems:   GEN: No acute illnesses, no fevers, chills. GI: No n/v/d, eating normally Pulm: No SOB Interactive and getting along well at home.  Otherwise, ROS is as per the HPI.   Physical Examination: Filed Vitals:   03/22/12 0809  BP: 114/70  Pulse: 68  Temp: 97.9 F (36.6 C)  TempSrc: Oral  Weight: 223 lb 8 oz (101.379 kg)    There  is no height on file to calculate BMI. Ideal Body Weight:     GEN: WDWN, NAD, Non-toxic, A & O x 3 HEENT: Atraumatic, Normocephalic. Neck supple. No masses, No LAD. Ears and Nose: No external deformity. CV: RRR, No M/G/R. No JVD. No thrill. No extra heart sounds. PULM: CTA B, no wheezes, crackles, rhonchi. No retractions. No resp. distress. No accessory muscle use. EXTR: No c/c/e NEURO Normal gait.  PSYCH: Normally interactive. Conversant. Not depressed or anxious appearing.  Calm demeanor.    Assessment and Plan:  1. DM   2. Hyperlipidemia LDL goal < 70    Stable, cont dm meds, cont to work on losing weight, LDL < 100 now with no meds  Orders Today:  No orders of the defined types were placed in this encounter.    Updated Medication List: (Includes new medications, updates to list, dose adjustments) Meds ordered  this encounter  Medications  . loratadine (CLARITIN) 10 MG tablet    Sig: Take 10 mg by mouth daily.    Medications Discontinued: There are no discontinued medications.   Hannah Beat, MD

## 2012-03-22 NOTE — Patient Instructions (Addendum)
F/u 4-5 months in winter for CPX (30 minutes)

## 2012-04-20 ENCOUNTER — Ambulatory Visit (INDEPENDENT_AMBULATORY_CARE_PROVIDER_SITE_OTHER): Payer: PRIVATE HEALTH INSURANCE | Admitting: Family Medicine

## 2012-04-20 ENCOUNTER — Encounter: Payer: Self-pay | Admitting: Family Medicine

## 2012-04-20 VITALS — BP 120/84 | HR 70 | Temp 98.8°F | Ht 68.0 in | Wt 225.2 lb

## 2012-04-20 DIAGNOSIS — J019 Acute sinusitis, unspecified: Secondary | ICD-10-CM

## 2012-04-20 DIAGNOSIS — L247 Irritant contact dermatitis due to plants, except food: Secondary | ICD-10-CM | POA: Insufficient documentation

## 2012-04-20 DIAGNOSIS — E119 Type 2 diabetes mellitus without complications: Secondary | ICD-10-CM

## 2012-04-20 DIAGNOSIS — L255 Unspecified contact dermatitis due to plants, except food: Secondary | ICD-10-CM

## 2012-04-20 MED ORDER — PREDNISONE 20 MG PO TABS: ORAL_TABLET | ORAL | Status: DC

## 2012-04-20 MED ORDER — AMOXICILLIN 500 MG PO CAPS: 1000.0000 mg | ORAL_CAPSULE | Freq: Two times a day (BID) | ORAL | Status: DC

## 2012-04-20 NOTE — Assessment & Plan Note (Signed)
Follow blood sugars closely at home on prednisone.

## 2012-04-20 NOTE — Assessment & Plan Note (Signed)
No anaphylaxsis. Course of steroids given rapidly spreading and very bothersome for pt.

## 2012-04-20 NOTE — Patient Instructions (Signed)
Restart claritin and flonase. Use mucinex D twice daily and nasal saline irrigation  Start oral antibiotics. Call if fever on antibiotics or not improving as expected.  Start oral steroids for plant dermatitis. Call if throat or lip swelling or shortness of breath.

## 2012-04-20 NOTE — Assessment & Plan Note (Signed)
>   10 days of symptoms, on decongestant. Treat with antibiotics, mucinex, nasal saline an steroid and claritin.

## 2012-04-20 NOTE — Progress Notes (Signed)
  Subjective:    Patient ID: Lonnie Jones, male    DOB: 1976-04-25, 36 y.o.   MRN: [id number]  Rash This is a new problem. The current episode started yesterday (Had been outside cutting trees few days ago, sdaw poison ivy). The problem has been rapidly worsening since onset. The affected locations include the right arm, left arm, left elbow, right elbow, right hand, right wrist, left hand, left wrist and left fingers. The rash is characterized by itchiness and redness. He was exposed to plant contact. Pertinent negatives include no cough, facial edema, fever, shortness of breath or sore throat. (No tounge swelling) Past treatments include anti-itch cream. There is no history of allergies, asthma or eczema.  Sinus Problem This is a new problem. The current episode started 1 to 4 weeks ago (10 days). The problem has been gradually worsening since onset. There has been no fever. The pain is moderate. Associated symptoms include sinus pressure. Pertinent negatives include no coughing, hoarse voice, shortness of breath or sore throat. (Nasal congestion, post nasal drip) Past treatments include oral decongestants (flonase). The treatment provided mild relief.      Review of Systems  Constitutional: Negative for fever.  HENT: Positive for sinus pressure. Negative for sore throat and hoarse voice.   Respiratory: Negative for cough and shortness of breath.   Skin: Positive for rash.       Objective:   Physical Exam  Constitutional: Vital signs are normal. He appears well-developed and well-nourished.  Non-toxic appearance. He does not appear ill. No distress.  HENT:  Head: Normocephalic and atraumatic.  Right Ear: Hearing, tympanic membrane, external ear and ear canal normal. No tenderness. No foreign bodies. Tympanic membrane is not retracted and not bulging.  Left Ear: Hearing, tympanic membrane, external ear and ear canal normal. No tenderness. No foreign bodies. Tympanic membrane is not  retracted and not bulging.  Nose: No mucosal edema or rhinorrhea. Right sinus exhibits maxillary sinus tenderness. Right sinus exhibits no frontal sinus tenderness. Left sinus exhibits maxillary sinus tenderness. Left sinus exhibits no frontal sinus tenderness.  Mouth/Throat: Uvula is midline, oropharynx is clear and moist and mucous membranes are normal. Normal dentition. No dental caries. No oropharyngeal exudate or tonsillar abscesses.  Eyes: Conjunctivae normal, EOM and lids are normal. Pupils are equal, round, and reactive to light. No foreign bodies found.  Neck: Trachea normal, normal range of motion and phonation normal. Neck supple. Carotid bruit is not present. No mass and no thyromegaly present.  Cardiovascular: Normal rate, regular rhythm, S1 normal, S2 normal, normal heart sounds, intact distal pulses and normal pulses.  Exam reveals no gallop.   No murmur heard. Pulmonary/Chest: Effort normal and breath sounds normal. No respiratory distress. He has no wheezes. He has no rhonchi. He has no rales.  Abdominal: Soft. Normal appearance and bowel sounds are normal. There is no hepatosplenomegaly. There is no tenderness. There is no rebound, no guarding and no CVA tenderness. No hernia.  Neurological: He is alert. He has normal reflexes.  Skin: Skin is warm, dry and intact. No rash noted.       Erythema excoriations and linear rash, no blisters yet.. On arms and between fingers  Psychiatric: He has a normal mood and affect. His speech is normal and behavior is normal. Judgment normal.          Assessment & Plan:

## 2012-05-28 ENCOUNTER — Ambulatory Visit (INDEPENDENT_AMBULATORY_CARE_PROVIDER_SITE_OTHER): Payer: Self-pay | Admitting: Family Medicine

## 2012-05-28 ENCOUNTER — Encounter: Payer: Self-pay | Admitting: Family Medicine

## 2012-05-28 VITALS — BP 140/80 | HR 88 | Temp 97.8°F | Ht 68.0 in | Wt 227.1 lb

## 2012-05-28 DIAGNOSIS — J45901 Unspecified asthma with (acute) exacerbation: Secondary | ICD-10-CM

## 2012-05-28 DIAGNOSIS — J45909 Unspecified asthma, uncomplicated: Secondary | ICD-10-CM

## 2012-05-28 MED ORDER — PREDNISONE 20 MG PO TABS: ORAL_TABLET | ORAL | Status: DC

## 2012-05-28 NOTE — Progress Notes (Signed)
Nature conservation officer at [facility] [address] [place] [address] Phone: [phone] Fax: [phone]  Date:  05/28/2012   Name:  Lonnie Jones   DOB:  1976/01/18   MRN:  [id number] Gender: male Age: 36 y.o.  PCP:  Hannah Beat, MD  Evaluating MD: Hannah Beat, MD   Chief Complaint: Asthma   History of Present Illness:  Lonnie Jones is a 36 y.o. 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 Aerosolized sulfuric acid, and now he is having trouble breathing, asthma attack, difficulty taking breath.  Patient Active Problem List  Diagnosis  . Diabetes mellitus  . MIGRAINE HEADACHE  . ALLERGIC RHINITIS  . ASTHMA  . GERD  . Hyperlipidemia LDL goal < 70  . Allergic rhinitis due to allergen  . Plant irritant contact dermatitis  . Acute sinus infection    Past Medical History  Diagnosis Date  . GERD (gastroesophageal reflux disease)   . Diabetes mellitus   . Asthma   . Migraine headache   . Allergy   . Hyperlipidemia LDL goal < 70 12/16/2010  . Allergic rhinitis due to allergen 08/25/2011    Past Surgical History  Procedure Date  . Appendectomy 2000  . Carpal tunnel release   . Knee surgery 2011    History  Substance Use Topics  . Smoking status: Former Games developer  . Smokeless tobacco: Current User    Types: Snuff     Comment: quit 2003  . Alcohol Use: No    No family history on file.  Allergies  Allergen Reactions  . Codeine     Medication list has been reviewed and updated.  Outpatient Prescriptions Prior to Visit  Medication Sig Dispense Refill  . amoxicillin (AMOXIL) 500 MG capsule Take 2 capsules (1,000 mg total) by mouth 2 (two) times daily.  40 capsule  0  . diclofenac (VOLTAREN) 75 MG EC tablet Take 1 tablet (75 mg total) by mouth 2 (two) times daily.  180 tablet  3  . fluticasone (FLONASE) 50 MCG/ACT nasal spray Place 2 sprays into the nose daily.  16 g  12    . glucose blood test strip Check BS three times a day  100 each  11  . Lancets MISC Check BS three times a day (250.00)  100 each  11  . loratadine (CLARITIN) 10 MG tablet Take 10 mg by mouth daily.      . metFORMIN (GLUCOPHAGE) 1000 MG tablet Take 1 tablet (1,000 mg total) by mouth 2 (two) times daily with a meal.  180 tablet  3  . predniSONE (DELTASONE) 20 MG tablet 3 tabs by mouth daily x 3 days, then 2 tabs by mouth daily x 2 days then 1 tab by mouth daily x 2 days  15 tablet  0   Last reviewed on 05/28/2012 12:39 PM by Consuello Masse, CMA  Review of Systems:  aas above, no fever, chills, or sweats.  Physical Examination: BP 140/80  Pulse 88  Temp 97.8 F (36.6 C) (Oral)  Ht 5\' 8"  (1.727 m)  Wt 227 lb 1.9 oz (103.021 kg)  BMI 34.53 kg/m2  SpO2 99%  Ideal Body Weight: Weight in (lb) to have BMI = 25: 164.1    GEN: A and O x 3. WDWN. NAD.    ENT: Nose clear, ext NML.  No LAD.  No JVD.  TM's clear. Oropharynx clear.  PULM: RR  24 on my exam, some increased labor without retractions. Decreased BS with rare wheeze.  CV: RRR, no M/G/R, No rubs, No JVD.   EXT: warm and well-perfused, No c/c/e. PSYCH: Pleasant and conversant.   Assessment and Plan:  1. Asthma attack    From exposure, short pred and ventolin Given HFA in the office of albuterol  Orders Today:  No orders of the defined types were placed in this encounter.    Updated Medication List: (Includes new medications, updates to list, dose adjustments) Meds ordered this encounter  Medications  . DISCONTD: predniSONE (DELTASONE) 20 MG tablet    Sig: 2 tabs for 3 days, then 1 tab for 3 days    Dispense:  9 tablet    Refill:  0  . predniSONE (DELTASONE) 20 MG tablet    Sig: 2 tabs for 3 days, then 1 tab for 3 days    Dispense:  9 tablet    Refill:  0    Medications Discontinued: Medications Discontinued During This Encounter  Medication Reason  . predniSONE (DELTASONE) 20 MG tablet   . predniSONE  (DELTASONE) 20 MG tablet Reorder     Hannah Beat, MD

## 2012-06-14 ENCOUNTER — Encounter: Payer: Self-pay | Admitting: Family Medicine

## 2012-06-14 ENCOUNTER — Ambulatory Visit (INDEPENDENT_AMBULATORY_CARE_PROVIDER_SITE_OTHER): Payer: BC Managed Care – PPO | Admitting: Family Medicine

## 2012-06-14 VITALS — BP 114/80 | HR 91 | Temp 99.2°F | Wt 224.0 lb

## 2012-06-14 DIAGNOSIS — R509 Fever, unspecified: Secondary | ICD-10-CM

## 2012-06-14 DIAGNOSIS — B9789 Other viral agents as the cause of diseases classified elsewhere: Secondary | ICD-10-CM

## 2012-06-14 DIAGNOSIS — B349 Viral infection, unspecified: Secondary | ICD-10-CM

## 2012-06-14 LAB — POCT INFLUENZA A/B
Influenza A, POC: NEGATIVE
Influenza B, POC: NEGATIVE

## 2012-06-14 NOTE — Patient Instructions (Addendum)
Use a warm compress on your face, drink plenty of fluids, start back on the flonase and take the mucinex.  I would get a flu shot when you are feeling better.

## 2012-06-14 NOTE — Progress Notes (Signed)
Sx started yesterday.  First noted head pressure and facial pressure.  No vomiting or diarrhea.  Temp up to 100.2.  Diffuse aches, moderate.  Occ cough.  Wife is sick with similar, but she also had vomiting and diarrhea.  Taking mucinex-D in addition to regular meds.  Occ wheeze, SABA helps with that.    Facial pressure is most bothersome to patient.   Meds, vitals, and allergies reviewed.   ROS: See HPI.  Otherwise, noncontributory.  GEN: nad, alert and oriented, nontoxic HEENT: mucous membranes moist, tm w/o erythema, nasal exam w/o erythema, clear discharge noted,  OP with cobblestoning, sinuses not ttp NECK: supple w/o LA CV: rrr.   PULM: ctab, no inc wob EXT: no edema SKIN: no acute rash

## 2012-06-14 NOTE — Assessment & Plan Note (Signed)
Flu test neg.  Nontoxic.  He hadn't been using flonase consistently, so he'll use that and mucinex.  Can get flu shot when illness resolved.  Supportive care o/w and f/u prn.  Pt agrees.

## 2012-07-19 ENCOUNTER — Other Ambulatory Visit (INDEPENDENT_AMBULATORY_CARE_PROVIDER_SITE_OTHER): Payer: BC Managed Care – PPO

## 2012-07-19 DIAGNOSIS — Z79899 Other long term (current) drug therapy: Secondary | ICD-10-CM

## 2012-07-19 DIAGNOSIS — E119 Type 2 diabetes mellitus without complications: Secondary | ICD-10-CM

## 2012-07-19 DIAGNOSIS — E785 Hyperlipidemia, unspecified: Secondary | ICD-10-CM

## 2012-07-19 LAB — HEPATIC FUNCTION PANEL
ALT: 34 U/L (ref 0–53)
AST: 21 U/L (ref 0–37)
Albumin: 4.2 g/dL (ref 3.5–5.2)
Alkaline Phosphatase: 41 U/L (ref 39–117)
Bilirubin, Direct: 0.1 mg/dL (ref 0.0–0.3)
Total Bilirubin: 0.8 mg/dL (ref 0.3–1.2)
Total Protein: 6.7 g/dL (ref 6.0–8.3)

## 2012-07-19 LAB — BASIC METABOLIC PANEL
BUN: 25 mg/dL — ABNORMAL HIGH (ref 6–23)
CO2: 29 mEq/L (ref 19–32)
Calcium: 9.2 mg/dL (ref 8.4–10.5)
Chloride: 104 mEq/L (ref 96–112)
Creatinine, Ser: 1.1 mg/dL (ref 0.4–1.5)
GFR: 84.86 mL/min (ref >60.00)
Glucose, Bld: 142 mg/dL — ABNORMAL HIGH (ref 70–99)
Potassium: 4.9 mEq/L (ref 3.5–5.1)
Sodium: 140 mEq/L (ref 135–145)

## 2012-07-19 LAB — CBC WITH DIFFERENTIAL/PLATELET
Basophils Absolute: 0 10*3/uL (ref 0.0–0.1)
Basophils Relative: 0.3 % (ref 0.0–3.0)
Eosinophils Absolute: 0.1 10*3/uL (ref 0.0–0.7)
Eosinophils Relative: 1.6 % (ref 0.0–5.0)
HCT: 48 % (ref 39.0–52.0)
Hemoglobin: 16.5 g/dL (ref 13.0–17.0)
Lymphocytes Relative: 29.9 % (ref 12.0–46.0)
Lymphs Abs: 1.7 10*3/uL (ref 0.7–4.0)
MCHC: 34.4 g/dL (ref 30.0–36.0)
MCV: 89.2 fl (ref 78.0–100.0)
Monocytes Absolute: 0.4 10*3/uL (ref 0.1–1.0)
Monocytes Relative: 8 % (ref 3.0–12.0)
Neutro Abs: 3.3 10*3/uL (ref 1.4–7.7)
Neutrophils Relative %: 60.2 % (ref 43.0–77.0)
Platelets: 210 10*3/uL (ref 150.0–400.0)
RBC: 5.38 Mil/uL (ref 4.22–5.81)
RDW: 12.5 % (ref 11.5–14.6)
WBC: 5.6 10*3/uL (ref 4.5–10.5)

## 2012-07-19 LAB — MICROALBUMIN / CREATININE URINE RATIO
Creatinine,U: 383.8 mg/dL
Microalb Creat Ratio: 0.6 mg/g (ref 0.0–30.0)
Microalb, Ur: 2.3 mg/dL — ABNORMAL HIGH (ref 0.0–1.9)

## 2012-07-19 LAB — LIPID PANEL
Cholesterol: 157 mg/dL (ref 0–200)
HDL: 30.8 mg/dL — ABNORMAL LOW (ref >39.00)
LDL Cholesterol: 103 mg/dL — ABNORMAL HIGH (ref 0–99)
Total CHOL/HDL Ratio: 5
Triglycerides: 118 mg/dL (ref 0.0–149.0)
VLDL: 23.6 mg/dL (ref 0.0–40.0)

## 2012-07-19 LAB — HEMOGLOBIN A1C: Hgb A1c MFr Bld: 5.5 % (ref 4.6–6.5)

## 2012-07-26 ENCOUNTER — Encounter: Payer: PRIVATE HEALTH INSURANCE | Admitting: Family Medicine

## 2012-07-28 ENCOUNTER — Encounter: Payer: Self-pay | Admitting: Family Medicine

## 2012-07-28 ENCOUNTER — Ambulatory Visit (INDEPENDENT_AMBULATORY_CARE_PROVIDER_SITE_OTHER): Payer: BC Managed Care – PPO | Admitting: Family Medicine

## 2012-07-28 VITALS — BP 120/74 | HR 78 | Temp 98.3°F | Ht 68.0 in | Wt 219.5 lb

## 2012-07-28 DIAGNOSIS — J309 Allergic rhinitis, unspecified: Secondary | ICD-10-CM

## 2012-07-28 DIAGNOSIS — Z0000 Encounter for general adult medical examination without abnormal findings: Secondary | ICD-10-CM

## 2012-07-28 DIAGNOSIS — Z23 Encounter for immunization: Secondary | ICD-10-CM

## 2012-07-28 MED ORDER — MELOXICAM 15 MG PO TABS: 15.0000 mg | ORAL_TABLET | Freq: Every day | ORAL | Status: DC

## 2012-07-28 MED ORDER — ALBUTEROL SULFATE HFA 108 (90 BASE) MCG/ACT IN AERS: 2.0000 | INHALATION_SPRAY | Freq: Four times a day (QID) | RESPIRATORY_TRACT | Status: DC | PRN

## 2012-07-28 MED ORDER — FLUTICASONE PROPIONATE 50 MCG/ACT NA SUSP: 2.0000 | Freq: Every day | NASAL | Status: DC

## 2012-07-28 MED ORDER — METFORMIN HCL 1000 MG PO TABS: 1000.0000 mg | ORAL_TABLET | Freq: Two times a day (BID) | ORAL | Status: DC

## 2012-07-28 NOTE — Progress Notes (Signed)
Nature conservation officer at [facility] [address] [place] [address] Phone: [phone] Fax: [phone]  Date:  07/28/2012   Name:  Lonnie Jones   DOB:  May 12, 1976   MRN:  [id number] Gender: male Age: 37 y.o.  Primary Physician:  Hannah Beat, MD  Evaluating MD: Hannah Beat, MD   Chief Complaint: Annual Exam   History of Present Illness:  TAMARICK KOVALCIK is a 37 y.o. pleasant patient who presents with the following:  CPX:   Shoulder pain  Preventative Health Maintenance Visit:  Health Maintenance Summary Reviewed and updated, unless pt declines services.  Tobacco History Reviewed. Dips Alcohol: No concerns, no excessive use Exercise Habits: minimal STD concerns: no risk or activity to increase risk Drug Use: None Encouraged self-testicular check  Health Maintenance  Topic Date Due  . Hemoglobin A1c  01/16/2013  . Influenza Vaccine  01/31/2013  . Ophthalmology Exam  06/02/2013  . Urine Microalbumin  07/19/2013  . Foot Exam  07/28/2013  . Pneumococcal Polysaccharide Vaccine (#2) 07/28/2017  . Tetanus/tdap  07/28/2022    Labs reviewed with the patient.  Results for orders placed in visit on 07/19/12  LIPID PANEL      Result Value Range   Cholesterol 157  0 - 200 mg/dL   Triglycerides 295.2  0.0 - 149.0 mg/dL   HDL 84.13 (*) >24.40 mg/dL   VLDL 10.2  0.0 - 72.5 mg/dL   LDL Cholesterol 366 (*) 0 - 99 mg/dL   Total CHOL/HDL Ratio 5    HEMOGLOBIN Y4I      Result Value Range   Hemoglobin A1C 5.5  4.6 - 6.5 %  MICROALBUMIN / CREATININE URINE RATIO      Result Value Range   Microalb, Ur 2.3 (*) 0.0 - 1.9 mg/dL   Creatinine,U 347.4     Microalb Creat Ratio 0.6  0.0 - 30.0 mg/g  CBC WITH DIFFERENTIAL      Result Value Range   WBC 5.6  4.5 - 10.5 K/uL   RBC 5.38  4.22 - 5.81 Mil/uL   Hemoglobin 16.5  13.0 - 17.0 g/dL   HCT 25.9  56.3 - 87.5 %   MCV 89.2  78.0 - 100.0 fl   MCHC 34.4  30.0 - 36.0 g/dL   RDW 64.3  32.9 - 51.8 %   Platelets  210.0  150.0 - 400.0 K/uL   Neutrophils Relative 60.2  43.0 - 77.0 %   Lymphocytes Relative 29.9  12.0 - 46.0 %   Monocytes Relative 8.0  3.0 - 12.0 %   Eosinophils Relative 1.6  0.0 - 5.0 %   Basophils Relative 0.3  0.0 - 3.0 %   Neutro Abs 3.3  1.4 - 7.7 K/uL   Lymphs Abs 1.7  0.7 - 4.0 K/uL   Monocytes Absolute 0.4  0.1 - 1.0 K/uL   Eosinophils Absolute 0.1  0.0 - 0.7 K/uL   Basophils Absolute 0.0  0.0 - 0.1 K/uL  BASIC METABOLIC PANEL      Result Value Range   Sodium 140  135 - 145 mEq/L   Potassium 4.9  3.5 - 5.1 mEq/L   Chloride 104  96 - 112 mEq/L   CO2 29  19 - 32 mEq/L   Glucose, Bld 142 (*) 70 - 99 mg/dL   BUN 25 (*) 6 - 23 mg/dL   Creatinine, Ser 1.1  0.4 - 1.5 mg/dL   Calcium 9.2  8.4 - 84.1 mg/dL   GFR  84.86  >60.00 mL/min  HEPATIC FUNCTION PANEL      Result Value Range   Total Bilirubin 0.8  0.3 - 1.2 mg/dL   Bilirubin, Direct 0.1  0.0 - 0.3 mg/dL   Alkaline Phosphatase 41  39 - 117 U/L   AST 21  0 - 37 U/L   ALT 34  0 - 53 U/L   Total Protein 6.7  6.0 - 8.3 g/dL   Albumin 4.2  3.5 - 5.2 g/dL   Diabetes Mellitus: Tolerating Medications: yes Compliance with diet: poor Exercise: minimal Avg blood sugars at home: rarely checks Foot problems: none Hypoglycemia: none No nausea, vomitting, blurred vision, polyuria.  Lab Results  Component Value Date   HGBA1C 5.5 07/19/2012    Wt Readings from Last 3 Encounters:  07/28/12 219 lb 8 oz (99.565 kg)  06/14/12 224 lb (101.606 kg)  05/28/12 227 lb 1.9 oz (103.021 kg)    Body mass index is 33.38 kg/(m^2).   Patient Active Problem List  Diagnosis  . Diabetes mellitus  . MIGRAINE HEADACHE  . ASTHMA  . GERD  . Hyperlipidemia LDL goal < 70  . Allergic rhinitis due to allergen    Past Medical History  Diagnosis Date  . GERD (gastroesophageal reflux disease)   . Diabetes mellitus   . Asthma   . Migraine headache   . Allergy   . Hyperlipidemia LDL goal < 70 12/16/2010  . Allergic rhinitis due to allergen  08/25/2011    Past Surgical History  Procedure Laterality Date  . Appendectomy  2000  . Carpal tunnel release    . Knee surgery  2011    History   Social History  . Marital Status: Married    Spouse Name: N/A    Number of Children: N/A  . Years of Education: N/A   Occupational History  . Not on file.   Social History Main Topics  . Smoking status: Former Games developer  . Smokeless tobacco: Current User    Types: Snuff     Comment: quit 2003  . Alcohol Use: No  . Drug Use: No  . Sexually Active: Not on file   Other Topics Concern  . Not on file   Social History Narrative  . No narrative on file    No family history on file.  Allergies  Allergen Reactions  . Codeine     Medication list has been reviewed and updated.  Outpatient Prescriptions Prior to Visit  Medication Sig Dispense Refill  . albuterol (PROVENTIL HFA;VENTOLIN HFA) 108 (90 BASE) MCG/ACT inhaler Inhale 2 puffs into the lungs every 6 (six) hours as needed.      . diclofenac (VOLTAREN) 75 MG EC tablet Take 1 tablet (75 mg total) by mouth 2 (two) times daily.  180 tablet  3  . fluticasone (FLONASE) 50 MCG/ACT nasal spray Place 2 sprays into the nose daily.  16 g  12  . glucose blood test strip Check BS three times a day  100 each  11  . Lancets MISC Check BS three times a day (250.00)  100 each  11  . loratadine (CLARITIN) 10 MG tablet Take 10 mg by mouth daily.      . metFORMIN (GLUCOPHAGE) 1000 MG tablet Take 1 tablet (1,000 mg total) by mouth 2 (two) times daily with a meal.  180 tablet  3   No facility-administered medications prior to visit.    Review of Systems:   General: Denies fever, chills, sweats. No significant  weight loss. Eyes: Denies blurring,significant itching ENT: Denies earache, sore throat, and hoarseness. Cardiovascular: Denies chest pains, palpitations, dyspnea on exertion Respiratory: Denies cough, dyspnea at rest,wheeezing Breast: no concerns about lumps GI: Denies nausea,  vomiting, diarrhea, constipation, change in bowel habits, abdominal pain, melena, hematochezia GU: Denies penile discharge, ED, urinary flow / outflow problems. No STD concerns. Musculoskeletal: Denies back pain. + INTERMITTENT SHOULDER PAIN Derm: Denies rash, itching Neuro: Denies  paresthesias, frequent falls, frequent headaches Psych: Denies depression, anxiety Endocrine: Denies cold intolerance, heat intolerance, polydipsia Heme: Denies enlarged lymph nodes Allergy: No hayfever   Physical Examination: BP 120/74  Pulse 78  Temp(Src) 98.3 F (36.8 C) (Oral)  Ht 5\' 8"  (1.727 m)  Wt 219 lb 8 oz (99.565 kg)  BMI 33.38 kg/m2  SpO2 96%  Ideal Body Weight: Weight in (lb) to have BMI = 25: 164.1   Wt Readings from Last 3 Encounters:  07/28/12 219 lb 8 oz (99.565 kg)  06/14/12 224 lb (101.606 kg)  05/28/12 227 lb 1.9 oz (103.021 kg)    GEN: well developed, well nourished, no acute distress Eyes: conjunctiva and lids normal, PERRLA, EOMI ENT: TM clear, nares clear, oral exam WNL Neck: supple, no lymphadenopathy, no thyromegaly, no JVD Pulm: clear to auscultation and percussion, respiratory effort normal CV: regular rate and rhythm, S1-S2, no murmur, rub or gallop, no bruits, peripheral pulses normal and symmetric, no cyanosis, clubbing, edema or varicosities Chest: no scars, masses GI: soft, non-tender; no hepatosplenomegaly, masses; active bowel sounds all quadrants GU: no hernia, testicular mass, penile discharge, or prostate enlargement Lymph: no cervical, axillary or inguinal adenopathy MSK: gait normal, muscle tone and strength WNL, no joint swelling, effusions, discoloration, crepitus  SKIN: clear, good turgor, color WNL, no rashes, lesions, or ulcerations Neuro: normal mental status, normal strength, sensation, and motion Psych: alert; oriented to person, place and time, normally interactive and not anxious or depressed in appearance.  Assessment and Plan:  Routine  general medical examination at a health care facility  Allergic rhinitis due to allergen - Plan: fluticasone (FLONASE) 50 MCG/ACT nasal spray  Need for prophylactic vaccination and inoculation against influenza - Plan: Flu vaccine greater than or equal to 3yo preservative free IM  Immunization due - Plan: Tdap vaccine greater than or equal to 7yo IM, Pneumococcal polysaccharide vaccine 23-valent greater than or equal to 2yo subcutaneous/IM  The patient's preventative maintenance and recommended screening tests for an annual wellness exam were reviewed in full today. Brought up to date unless services declined.  Counselled on the importance of diet, exercise, and its role in overall health and mortality. The patient's FH and SH was reviewed, including their home life, tobacco status, and drug and alcohol status.   Change to mobic Update all vaccines  Orders Today:  Orders Placed This Encounter  Procedures  . Tdap vaccine greater than or equal to 7yo IM  . Pneumococcal polysaccharide vaccine 23-valent greater than or equal to 2yo subcutaneous/IM  . Flu vaccine greater than or equal to 3yo preservative free IM    Updated Medication List: (Includes new medications, updates to list, dose adjustments) Meds ordered this encounter  Medications  . albuterol (PROVENTIL HFA;VENTOLIN HFA) 108 (90 BASE) MCG/ACT inhaler    Sig: Inhale 2 puffs into the lungs every 6 (six) hours as needed.    Dispense:  1 Inhaler    Refill:  5  . meloxicam (MOBIC) 15 MG tablet    Sig: Take 1 tablet (15 mg total) by mouth  daily.    Dispense:  90 tablet    Refill:  3  . metFORMIN (GLUCOPHAGE) 1000 MG tablet    Sig: Take 1 tablet (1,000 mg total) by mouth 2 (two) times daily with a meal.    Dispense:  180 tablet    Refill:  3  . fluticasone (FLONASE) 50 MCG/ACT nasal spray    Sig: Place 2 sprays into the nose daily.    Dispense:  48 g    Refill:  3    Medications Discontinued: Medications Discontinued  During This Encounter  Medication Reason  . albuterol (PROVENTIL HFA;VENTOLIN HFA) 108 (90 BASE) MCG/ACT inhaler Reorder  . diclofenac (VOLTAREN) 75 MG EC tablet   . metFORMIN (GLUCOPHAGE) 1000 MG tablet Reorder  . fluticasone (FLONASE) 50 MCG/ACT nasal spray Reorder      Signed, Karleen Hampshire T. Laresha Bacorn, MD 07/28/2012 9:24 AM

## 2012-08-02 ENCOUNTER — Other Ambulatory Visit: Payer: Self-pay

## 2012-08-02 NOTE — Telephone Encounter (Signed)
[facility] [address]. Faxed request for new glucose meter, test strips and lancets for Relion prime glucose meter with strips and lancets. Relion is more affordable for pt.Please advise.

## 2012-08-03 MED ORDER — BLOOD GLUCOSE METER KIT: PACK | Status: DC

## 2012-08-03 MED ORDER — LANCETS MISC: Status: DC

## 2012-08-03 MED ORDER — GLUCOSE BLOOD VI STRP: ORAL_STRIP | Status: DC

## 2013-01-25 ENCOUNTER — Telehealth: Payer: Self-pay

## 2013-01-25 NOTE — Telephone Encounter (Signed)
Pt has appt 01/26/13 at 8 am to see Dr Patsy Lager; pt wanted to know if he should have had labs prior to the appt to check cholesterol and A1C. Advised pt if would like to come fasting to appt in AM after Dr Patsy Lager sees pt may decide to do labs and pt can go by labs at end of visit and then will receive call back with results. Pt said he will do that.

## 2013-01-26 ENCOUNTER — Encounter: Payer: Self-pay | Admitting: Family Medicine

## 2013-01-26 ENCOUNTER — Encounter: Payer: Self-pay | Admitting: *Deleted

## 2013-01-26 ENCOUNTER — Ambulatory Visit (INDEPENDENT_AMBULATORY_CARE_PROVIDER_SITE_OTHER): Payer: BC Managed Care – PPO | Admitting: Family Medicine

## 2013-01-26 VITALS — BP 120/80 | HR 65 | Temp 97.7°F | Ht 68.0 in | Wt 220.5 lb

## 2013-01-26 DIAGNOSIS — E119 Type 2 diabetes mellitus without complications: Secondary | ICD-10-CM

## 2013-01-26 DIAGNOSIS — Z23 Encounter for immunization: Secondary | ICD-10-CM

## 2013-01-26 LAB — HEMOGLOBIN A1C: Hgb A1c MFr Bld: 5.7 % (ref 4.6–6.5)

## 2013-01-26 NOTE — Progress Notes (Signed)
Nature conservation officer at [facility] [address] [place] [address] Phone: [phone] Fax: [phone]  Date:  01/26/2013   Name:  Lonnie Jones   DOB:  Aug 17, 1975   MRN:  [id number] Gender: male Age: 37 y.o.  Primary Physician:  Hannah Beat, MD  Evaluating MD: Hannah Beat, MD   Chief Complaint: Diabetes   History of Present Illness:  Lonnie Jones is a 37 y.o. pleasant patient who presents with the following:  F/u DM: Flu shot  Wt Readings from Last 3 Encounters:  01/26/13 220 lb 8 oz (100.018 kg)  07/28/12 219 lb 8 oz (99.565 kg)  06/14/12 224 lb (101.606 kg)   230 max Low of 70 Sometimes shaky Sometimes during day When working really hard, will drop sometimes.  Patient Active Problem List   Diagnosis Date Noted  . Allergic rhinitis due to allergen 08/25/2011  . Hyperlipidemia LDL goal < 70 12/16/2010  . Diabetes mellitus 08/08/2010  . MIGRAINE HEADACHE 08/08/2010  . ASTHMA 08/08/2010  . GERD 08/08/2010    Past Medical History  Diagnosis Date  . GERD (gastroesophageal reflux disease)   . Diabetes mellitus   . Asthma   . Migraine headache   . Allergy   . Hyperlipidemia LDL goal < 70 12/16/2010  . Allergic rhinitis due to allergen 08/25/2011    Past Surgical History  Procedure Laterality Date  . Appendectomy  2000  . Carpal tunnel release    . Knee surgery  2011    History   Social History  . Marital Status: Married    Spouse Name: N/A    Number of Children: N/A  . Years of Education: N/A   Occupational History  . Not on file.   Social History Main Topics  . Smoking status: Former Games developer  . Smokeless tobacco: Current User    Types: Snuff     Comment: quit 2003  . Alcohol Use: No  . Drug Use: No  . Sexual Activity: Not on file   Other Topics Concern  . Not on file   Social History Narrative  . No narrative on file    No family history on file.  Allergies  Allergen Reactions  . Codeine     Medication  list has been reviewed and updated.  Outpatient Prescriptions Prior to Visit  Medication Sig Dispense Refill  . albuterol (PROVENTIL HFA;VENTOLIN HFA) 108 (90 BASE) MCG/ACT inhaler Inhale 2 puffs into the lungs every 6 (six) hours as needed.  1 Inhaler  5  . Blood Glucose Monitoring Suppl (BLOOD GLUCOSE METER) kit Relion Prime meter. pt test three times daily.250.00  1 each  0  . fluticasone (FLONASE) 50 MCG/ACT nasal spray Place 2 sprays into the nose daily.  48 g  3  . glucose blood test strip Check BS three times a day  100 each  11  . Lancets MISC Check BS three times a day (250.00)  100 each  11  . loratadine (CLARITIN) 10 MG tablet Take 10 mg by mouth daily.      . meloxicam (MOBIC) 15 MG tablet Take 1 tablet (15 mg total) by mouth daily.  90 tablet  3  . metFORMIN (GLUCOPHAGE) 1000 MG tablet Take 1 tablet (1,000 mg total) by mouth 2 (two) times daily with a meal.  180 tablet  3   No facility-administered medications prior to visit.    Review of Systems:   GEN: No acute illnesses, no fevers, chills. GI: No  n/v/d, eating normally Pulm: No SOB Interactive and getting along well at home.  Otherwise, ROS is as per the HPI.   Physical Examination: BP 120/80  Pulse 65  Temp(Src) 97.7 F (36.5 C) (Oral)  Ht 5\' 8"  (1.727 m)  Wt 220 lb 8 oz (100.018 kg)  BMI 33.53 kg/m2  Ideal Body Weight: Weight in (lb) to have BMI = 25: 164.1   GEN: WDWN, NAD, Non-toxic, A & O x 3 HEENT: Atraumatic, Normocephalic. Neck supple. No masses, No LAD. Ears and Nose: No external deformity. CV: RRR, No M/G/R. No JVD. No thrill. No extra heart sounds. PULM: CTA B, no wheezes, crackles, rhonchi. No retractions. No resp. distress. No accessory muscle use. EXTR: No c/c/e NEURO Normal gait.  PSYCH: Normally interactive. Conversant. Not depressed or anxious appearing.  Calm demeanor.    Assessment and Plan:  Type II or unspecified type diabetes mellitus without mention of complication, not stated as  uncontrolled - Plan: Hemoglobin A1c  Need for prophylactic vaccination and inoculation against influenza - Plan: Flu Vaccine QUAD 36+ mos PF IM (Fluarix)  Diabetes mellitus  DM stable  Lab Results  Component Value Date   HGBA1C 5.7 01/26/2013     Orders Today:  Orders Placed This Encounter  Procedures  . Flu Vaccine QUAD 36+ mos PF IM (Fluarix)  . Hemoglobin A1c    Updated Medication List: (Includes new medications, updates to list, dose adjustments) No orders of the defined types were placed in this encounter.    Medications Discontinued: There are no discontinued medications.    Signed, Elpidio Galea. Kammie Scioli, MD 01/26/2013 8:21 AM

## 2013-06-13 ENCOUNTER — Encounter: Payer: Self-pay | Admitting: Family Medicine

## 2013-06-13 ENCOUNTER — Ambulatory Visit (INDEPENDENT_AMBULATORY_CARE_PROVIDER_SITE_OTHER): Payer: BC Managed Care – PPO | Admitting: Family Medicine

## 2013-06-13 DIAGNOSIS — S66819A Strain of other specified muscles, fascia and tendons at wrist and hand level, unspecified hand, initial encounter: Secondary | ICD-10-CM

## 2013-06-13 DIAGNOSIS — S63599A Other specified sprain of unspecified wrist, initial encounter: Secondary | ICD-10-CM

## 2013-06-13 NOTE — Progress Notes (Signed)
Pre-visit discussion using our clinic review tool. No additional management support is needed unless otherwise documented below in the visit note.  

## 2013-06-13 NOTE — Progress Notes (Signed)
Date:  06/13/2013   Name:  Lonnie Jones   DOB:  08-16-1975   MRN:  [id number] Gender: male Age: 38 y.o.  Primary Physician:  Owens Loffler, MD   Chief Complaint: Hand Pain   Subjective:   History of Present Illness:  Lonnie Jones is a 38 y.o. pleasant patient who presents with the following:  R hand, was picking up a hot water heater and tendons on the dorsum of hand. Hurting a llot. At baseline, it really does not hurt although much. It particularly hurts him when he is trying to do some twisting motions. It hurt when he was trying to use a wrench yesterday. He has some mild swelling around the third and second MCPs and in the metacarpals. There is no bruising.  Patient Active Problem List   Diagnosis Date Noted  . Allergic rhinitis due to allergen 08/25/2011  . Hyperlipidemia LDL goal < 70 12/16/2010  . Diabetes mellitus 08/08/2010  . MIGRAINE HEADACHE 08/08/2010  . ASTHMA 08/08/2010  . GERD 08/08/2010    Past Medical History  Diagnosis Date  . GERD (gastroesophageal reflux disease)   . Diabetes mellitus   . Asthma   . Migraine headache   . Allergy   . Hyperlipidemia LDL goal < 70 12/16/2010  . Allergic rhinitis due to allergen 08/25/2011    Past Surgical History  Procedure Laterality Date  . Appendectomy  2000  . Carpal tunnel release    . Knee surgery  2011    History   Social History  . Marital Status: Married    Spouse Name: N/A    Number of Children: N/A  . Years of Education: N/A   Occupational History  . Not on file.   Social History Main Topics  . Smoking status: Former Research scientist (life sciences)  . Smokeless tobacco: Current User    Types: Snuff     Comment: quit 2003  . Alcohol Use: No  . Drug Use: No  . Sexual Activity: Not on file   Other Topics Concern  . Not on file   Social History Narrative  . No narrative on file    No family history on file.  Allergies  Allergen Reactions  . Codeine     Medication list has been reviewed and  updated.  Review of Systems:  GEN: No fevers, chills. Nontoxic. Primarily MSK c/o today. MSK: Detailed in the HPI GI: tolerating PO intake without difficulty Neuro: No numbness, parasthesias, or tingling associated. Otherwise the pertinent positives of the ROS are noted above.   Objective:   Physical Examination: BP 120/70  Pulse 66  Temp(Src) 98.4 F (36.9 C) (Oral)  Ht 5' 8"  (1.727 m)  Wt 229 lb 4 oz (103.987 kg)  BMI 34.87 kg/m2  Ideal Body Weight: Weight in (lb) to have BMI = 25: 164.1   GEN: WDWN, NAD, Non-toxic, Alert & Oriented x 3 HEENT: Atraumatic, Normocephalic.  Ears and Nose: No external deformity. EXTR: No clubbing/cyanosis/edema NEURO: Normal gait.  PSYCH: Normally interactive. Conversant. Not depressed or anxious appearing.  Calm demeanor.   Hand: R Ecchymosis or edema: neg ROM wrist/hand/digits/elbow: full  Carpals, MCP's, digits: NT Distal Ulna and Radius: NT Supination lift test: painful but not true positive Ecchymosis or edema: mild edema 3rd and 4th dorsum Cysts/nodules: neg Finkelstein's test: neg Snuffbox tenderness: neg Scaphoid tubercle: NT Hook of Hamate: NT Resisted supination: somewhat painful Full composite fist Grip, all digits: 5/5 str No tenosynovitis Axial load test: neg Phalen's: neg Tinel's:  neg Atrophy: neg  Hand sensation: intact   No results found.  Assessment & Plan:    Wrist sprain and strain, right, initial encounter  Likely partial tear of wrist ligament. Place in cock-up wrist splint, modify activities for 3 weeks.   If still probs in 5-6 weeks, f/u with me.  New medications, updates to list, dose adjustments: Meds ordered this encounter  Medications  . phenylephrine (SUDAFED PE) 10 MG TABS tablet    Sig: Take 10 mg by mouth 2 (two) times daily.    Signed,  Maud Deed. Maikol Grassia, MD, [place] at [facility] [place] [address] Phone:  [phone] Fax: [phone]    Medication List       This list is accurate as of: 06/13/13  8:46 AM.  Always use your most recent med list.               albuterol 108 (90 BASE) MCG/ACT inhaler  Commonly known as:  PROVENTIL HFA;VENTOLIN HFA  Inhale 2 puffs into the lungs every 6 (six) hours as needed.     Blood Glucose Meter kit  Relion Prime meter. pt test three times daily.250.00     fluticasone 50 MCG/ACT nasal spray  Commonly known as:  FLONASE  Place 2 sprays into the nose daily.     glucose blood test strip  Check BS three times a day     Lancets Misc  Check BS three times a day (250.00)     meloxicam 15 MG tablet  Commonly known as:  MOBIC  Take 1 tablet (15 mg total) by mouth daily.     metFORMIN 1000 MG tablet  Commonly known as:  GLUCOPHAGE  Take 1 tablet (1,000 mg total) by mouth 2 (two) times daily with a meal.     phenylephrine 10 MG Tabs tablet  Commonly known as:  SUDAFED PE  Take 10 mg by mouth 2 (two) times daily.

## 2013-07-16 ENCOUNTER — Other Ambulatory Visit: Payer: Self-pay | Admitting: Family Medicine

## 2013-07-27 ENCOUNTER — Other Ambulatory Visit (INDEPENDENT_AMBULATORY_CARE_PROVIDER_SITE_OTHER): Payer: BC Managed Care – PPO

## 2013-07-27 DIAGNOSIS — E785 Hyperlipidemia, unspecified: Secondary | ICD-10-CM

## 2013-07-27 DIAGNOSIS — E1059 Type 1 diabetes mellitus with other circulatory complications: Secondary | ICD-10-CM

## 2013-07-27 DIAGNOSIS — Z79899 Other long term (current) drug therapy: Secondary | ICD-10-CM

## 2013-07-27 LAB — MICROALBUMIN / CREATININE URINE RATIO
Creatinine,U: 51.2 mg/dL
Microalb Creat Ratio: 3.3 mg/g (ref 0.0–30.0)
Microalb, Ur: 1.7 mg/dL (ref 0.0–1.9)

## 2013-07-27 LAB — BASIC METABOLIC PANEL
BUN: 19 mg/dL (ref 6–23)
CO2: 28 mEq/L (ref 19–32)
Calcium: 9.8 mg/dL (ref 8.4–10.5)
Chloride: 102 mEq/L (ref 96–112)
Creatinine, Ser: 0.7 mg/dL (ref 0.4–1.5)
GFR: 134.73 mL/min (ref >60.00)
Glucose, Bld: 119 mg/dL — ABNORMAL HIGH (ref 70–99)
Potassium: 4.7 mEq/L (ref 3.5–5.1)
Sodium: 135 mEq/L (ref 135–145)

## 2013-07-27 LAB — CBC WITH DIFFERENTIAL/PLATELET
Basophils Absolute: 0 10*3/uL (ref 0.0–0.1)
Basophils Relative: 0.3 % (ref 0.0–3.0)
Eosinophils Absolute: 0.1 10*3/uL (ref 0.0–0.7)
Eosinophils Relative: 1.4 % (ref 0.0–5.0)
HCT: 51.5 % (ref 39.0–52.0)
Hemoglobin: 17.3 g/dL — ABNORMAL HIGH (ref 13.0–17.0)
Lymphocytes Relative: 33.1 % (ref 12.0–46.0)
Lymphs Abs: 1.8 10*3/uL (ref 0.7–4.0)
MCHC: 33.6 g/dL (ref 30.0–36.0)
MCV: 90.2 fl (ref 78.0–100.0)
Monocytes Absolute: 0.5 10*3/uL (ref 0.1–1.0)
Monocytes Relative: 9.5 % (ref 3.0–12.0)
Neutro Abs: 3 10*3/uL (ref 1.4–7.7)
Neutrophils Relative %: 55.7 % (ref 43.0–77.0)
Platelets: 206 10*3/uL (ref 150.0–400.0)
RBC: 5.7 Mil/uL (ref 4.22–5.81)
RDW: 12.3 % (ref 11.5–14.6)
WBC: 5.4 10*3/uL (ref 4.5–10.5)

## 2013-07-27 LAB — HEPATIC FUNCTION PANEL
ALT: 53 U/L (ref 0–53)
AST: 24 U/L (ref 0–37)
Albumin: 4.3 g/dL (ref 3.5–5.2)
Alkaline Phosphatase: 56 U/L (ref 39–117)
Bilirubin, Direct: 0.2 mg/dL (ref 0.0–0.3)
Total Bilirubin: 0.9 mg/dL (ref 0.3–1.2)
Total Protein: 6.8 g/dL (ref 6.0–8.3)

## 2013-07-27 LAB — LIPID PANEL
Cholesterol: 146 mg/dL (ref 0–200)
HDL: 33.5 mg/dL — ABNORMAL LOW (ref >39.00)
LDL Cholesterol: 101 mg/dL — ABNORMAL HIGH (ref 0–99)
Total CHOL/HDL Ratio: 4
Triglycerides: 60 mg/dL (ref 0.0–149.0)
VLDL: 12 mg/dL (ref 0.0–40.0)

## 2013-07-27 LAB — HEMOGLOBIN A1C: Hgb A1c MFr Bld: 5.7 % (ref 4.6–6.5)

## 2013-07-28 ENCOUNTER — Other Ambulatory Visit: Payer: BC Managed Care – PPO

## 2013-08-03 ENCOUNTER — Encounter: Payer: Self-pay | Admitting: Family Medicine

## 2013-08-03 ENCOUNTER — Ambulatory Visit (INDEPENDENT_AMBULATORY_CARE_PROVIDER_SITE_OTHER): Payer: BC Managed Care – PPO | Admitting: Family Medicine

## 2013-08-03 VITALS — BP 112/76 | HR 82 | Temp 98.3°F | Ht 67.5 in | Wt 222.2 lb

## 2013-08-03 DIAGNOSIS — Z0000 Encounter for general adult medical examination without abnormal findings: Secondary | ICD-10-CM

## 2013-08-03 DIAGNOSIS — J309 Allergic rhinitis, unspecified: Secondary | ICD-10-CM

## 2013-08-03 MED ORDER — PRAVASTATIN SODIUM 20 MG PO TABS: 20.0000 mg | ORAL_TABLET | Freq: Every day | ORAL | Status: DC

## 2013-08-03 MED ORDER — FLUTICASONE PROPIONATE 50 MCG/ACT NA SUSP: 2.0000 | Freq: Every day | NASAL | Status: DC

## 2013-08-03 NOTE — Progress Notes (Signed)
Date:  08/03/2013   Name:  Lonnie Jones   DOB:  22-Nov-1975   MRN:  [id number] Gender: male Age: 38 y.o.  Primary Physician:  Owens Loffler, MD   Chief Complaint: Annual Exam   Subjective:   History of Present Illness:  Lonnie Jones is a 38 y.o. pleasant patient who presents with the following:  Preventative Health Maintenance Visit:  Health Maintenance Summary Reviewed and updated, unless pt declines services.  Tobacco History Reviewed. Alcohol: No concerns, no excessive use Exercise Habits: minimal STD concerns: no risk or activity to increase risk Drug Use: None Encouraged self-testicular check  Health Maintenance  Topic Date Due  . Ophthalmology Exam  06/02/2013  . Foot Exam  07/28/2013  . Influenza Vaccine  12/31/2013  . Hemoglobin A1c  01/24/2014  . Urine Microalbumin  07/27/2014  . Pneumococcal Polysaccharide Vaccine (##2) 07/28/2017  . Tetanus/tdap  07/28/2022    Immunization History  Administered Date(s) Administered  . Influenza, Seasonal, Injecte, Preservative Fre 07/28/2012  . Influenza,inj,Quad PF,36+ Mos 01/26/2013  . Pneumococcal Polysaccharide-23 07/28/2012  . Tdap 07/28/2012   Diabetes Mellitus: Tolerating Medications: yes Compliance with diet: fair Exercise: minimal / intermittent Avg blood sugars at home: not checking Foot problems: none Hypoglycemia: none No nausea, vomitting, blurred vision, polyuria.  Lab Results  Component Value Date   HGBA1C 5.7 07/27/2013   HGBA1C 5.7 01/26/2013   HGBA1C 5.5 07/19/2012   Lab Results  Component Value Date   MICROALBUR 1.7 07/27/2013   LDLCALC 101* 07/27/2013   CREATININE 0.7 07/27/2013    Wt Readings from Last 3 Encounters:  08/03/13 222 lb 4 oz (100.812 kg)  06/13/13 229 lb 4 oz (103.987 kg)  01/26/13 220 lb 8 oz (100.018 kg)    Body mass index is 34.28 kg/(m^2).   Patient Active Problem List   Diagnosis Date Noted  . Allergic rhinitis due to allergen 08/25/2011  . Hyperlipidemia  LDL goal < 70 12/16/2010  . Diabetes mellitus 08/08/2010  . MIGRAINE HEADACHE 08/08/2010  . ASTHMA 08/08/2010  . GERD 08/08/2010    Past Medical History  Diagnosis Date  . GERD (gastroesophageal reflux disease)   . Diabetes mellitus   . Asthma   . Migraine headache   . Allergy   . Hyperlipidemia LDL goal < 70 12/16/2010  . Allergic rhinitis due to allergen 08/25/2011    Past Surgical History  Procedure Laterality Date  . Appendectomy  2000  . Carpal tunnel release    . Knee surgery  2011    History   Social History  . Marital Status: Married    Spouse Name: N/A    Number of Children: N/A  . Years of Education: N/A   Occupational History  . Not on file.   Social History Main Topics  . Smoking status: Former Research scientist (life sciences)  . Smokeless tobacco: Current User    Types: Snuff     Comment: quit 2003  . Alcohol Use: No  . Drug Use: No  . Sexual Activity: Not on file   Other Topics Concern  . Not on file   Social History Narrative  . No narrative on file    No family history on file.  Allergies  Allergen Reactions  . Codeine     Medication list has been reviewed and updated.  Review of Systems:  General: CURRENT GI ILLNESS Eyes: Denies blurring,significant itching ENT: Denies earache, sore throat, and hoarseness. Cardiovascular: Denies chest pains, palpitations, dyspnea on exertion Respiratory: Denies cough, dyspnea  at rest,wheeezing Breast: no concerns about lumps GI: N AND DIARRHEA, NO V GU: Denies penile discharge, ED, urinary flow / outflow problems. No STD concerns. Musculoskeletal: Denies back pain, joint pain Derm: Denies rash, itching Neuro: Denies  paresthesias, frequent falls, frequent headaches Psych: Denies depression, anxiety Endocrine: Denies cold intolerance, heat intolerance, polydipsia Heme: Denies enlarged lymph nodes Allergy: No hayfever  Objective:   Physical Examination: BP 112/76  Pulse 82  Temp(Src) 98.3 F (36.8 C) (Oral)  Ht  5' 7.5" (1.715 m)  Wt 222 lb 4 oz (100.812 kg)  BMI 34.28 kg/m2  SpO2 97% Ideal Body Weight: Weight in (lb) to have BMI = 25: 161.7  GEN: well developed, well nourished, no acute distress Eyes: conjunctiva and lids normal, PERRLA, EOMI ENT: TM clear, nares clear, oral exam WNL Neck: supple, no lymphadenopathy, no thyromegaly, no JVD Pulm: clear to auscultation and percussion, respiratory effort normal CV: regular rate and rhythm, S1-S2, no murmur, rub or gallop, no bruits, peripheral pulses normal and symmetric, no cyanosis, clubbing, edema or varicosities GI: soft, non-tender; no hepatosplenomegaly, masses; active bowel sounds all quadrants GU: no hernia, testicular mass, penile discharge Lymph: no cervical, axillary or inguinal adenopathy MSK: gait normal, muscle tone and strength WNL, no joint swelling, effusions, discoloration, crepitus  SKIN: clear, good turgor, color WNL, no rashes, lesions, or ulcerations Neuro: normal mental status, normal strength, sensation, and motion Psych: alert; oriented to person, place and time, normally interactive and not anxious or depressed in appearance.  All labs reviewed with patient.  Lipids:    Component Value Date/Time   CHOL 146 07/27/2013 1002   TRIG 60.0 07/27/2013 1002   HDL 33.50* 07/27/2013 1002   LDLDIRECT 108.0 12/10/2010 0820   VLDL 12.0 07/27/2013 1002   CHOLHDL 4 07/27/2013 1002    CBC:    Component Value Date/Time   WBC 5.4 07/27/2013 1002   HGB 17.3* 07/27/2013 1002   HCT 51.5 07/27/2013 1002   PLT 206.0 07/27/2013 1002   MCV 90.2 07/27/2013 1002   NEUTROABS 3.0 07/27/2013 1002   LYMPHSABS 1.8 07/27/2013 1002   MONOABS 0.5 07/27/2013 1002   EOSABS 0.1 07/27/2013 1002   BASOSABS 0.0 07/27/2013 1884    Basic Metabolic Panel:    Component Value Date/Time   NA 135 07/27/2013 1002   K 4.7 07/27/2013 1002   CL 102 07/27/2013 1002   CO2 28 07/27/2013 1002   BUN 19 07/27/2013 1002   CREATININE 0.7 07/27/2013 1002   GLUCOSE 119*  07/27/2013 1002   CALCIUM 9.8 07/27/2013 1002    Lab Results  Component Value Date   ALT 53 07/27/2013   AST 24 07/27/2013   ALKPHOS 56 07/27/2013   BILITOT 0.9 07/27/2013    No results found for this basename: TSH    No results found for this basename: PSA    Assessment & Plan:   Health Maintenance Exam: The patient's preventative maintenance and recommended screening tests for an annual wellness exam were reviewed in full today. Brought up to date unless services declined.  Counselled on the importance of diet, exercise, and its role in overall health and mortality. The patient's FH and SH was reviewed, including their home life, tobacco status, and drug and alcohol status.  Routine general medical examination at a health care facility  Allergic rhinitis due to allergen - Plan: fluticasone (FLONASE) 50 MCG/ACT nasal spray  Start statin given DM  Fluids for acute illness, but he is otherwise taking excellent care of his  DM  New Prescriptions   PRAVASTATIN (PRAVACHOL) 20 MG TABLET    Take 1 tablet (20 mg total) by mouth daily.   No orders of the defined types were placed in this encounter.   Signed,  Maud Deed. Mayco Walrond, MD, [place] at [facility] [place] [address] Phone: [phone] Fax: [phone]  There are no Patient Instructions on file for this visit. Patient's Medications  New Prescriptions   PRAVASTATIN (PRAVACHOL) 20 MG TABLET    Take 1 tablet (20 mg total) by mouth daily.  Previous Medications   ALBUTEROL (PROVENTIL HFA;VENTOLIN HFA) 108 (90 BASE) MCG/ACT INHALER    Inhale 2 puffs into the lungs every 6 (six) hours as needed.   BLOOD GLUCOSE MONITORING SUPPL (BLOOD GLUCOSE METER) KIT    Relion Prime meter. pt test three times daily.250.00   GLUCOSE BLOOD TEST STRIP    Check BS three times a day   LANCETS MISC    Check BS three times a day (250.00)   MELOXICAM (MOBIC) 15 MG TABLET    TAKE ONE TABLET BY  MOUTH ONCE DAILY   METFORMIN (GLUCOPHAGE) 1000 MG TABLET    TAKE ONE TABLET BY MOUTH TWICE DAILY WITH FOOD   PHENYLEPHRINE (SUDAFED PE) 10 MG TABS TABLET    Take 10 mg by mouth 2 (two) times daily.  Modified Medications   Modified Medication Previous Medication   FLUTICASONE (FLONASE) 50 MCG/ACT NASAL SPRAY fluticasone (FLONASE) 50 MCG/ACT nasal spray      Place 2 sprays into both nostrils daily.    Place 2 sprays into the nose daily.  Discontinued Medications   No medications on file

## 2013-08-03 NOTE — Progress Notes (Signed)
Pre visit review using our clinic review tool, if applicable. No additional management support is needed unless otherwise documented below in the visit note. 

## 2013-10-17 ENCOUNTER — Other Ambulatory Visit: Payer: Self-pay | Admitting: Family Medicine

## 2013-10-24 ENCOUNTER — Other Ambulatory Visit: Payer: Self-pay | Admitting: Family Medicine

## 2013-11-28 ENCOUNTER — Ambulatory Visit (INDEPENDENT_AMBULATORY_CARE_PROVIDER_SITE_OTHER): Payer: BC Managed Care – PPO | Admitting: Family Medicine

## 2013-11-28 ENCOUNTER — Encounter: Payer: Self-pay | Admitting: Family Medicine

## 2013-11-28 VITALS — BP 110/62 | HR 78 | Temp 98.3°F | Ht 67.5 in | Wt 231.2 lb

## 2013-11-28 DIAGNOSIS — Z79899 Other long term (current) drug therapy: Secondary | ICD-10-CM

## 2013-11-28 DIAGNOSIS — R609 Edema, unspecified: Secondary | ICD-10-CM

## 2013-11-28 DIAGNOSIS — E119 Type 2 diabetes mellitus without complications: Secondary | ICD-10-CM

## 2013-11-28 DIAGNOSIS — M130 Polyarthritis, unspecified: Secondary | ICD-10-CM

## 2013-11-28 LAB — CBC WITH DIFFERENTIAL/PLATELET
Basophils Absolute: 0 10*3/uL (ref 0.0–0.1)
Basophils Relative: 0.3 % (ref 0.0–3.0)
Eosinophils Absolute: 0.1 10*3/uL (ref 0.0–0.7)
Eosinophils Relative: 1 % (ref 0.0–5.0)
HCT: 46.6 % (ref 39.0–52.0)
Hemoglobin: 15.9 g/dL (ref 13.0–17.0)
Lymphocytes Relative: 8.7 % — ABNORMAL LOW (ref 12.0–46.0)
Lymphs Abs: 0.8 10*3/uL (ref 0.7–4.0)
MCHC: 34.2 g/dL (ref 30.0–36.0)
MCV: 89 fl (ref 78.0–100.0)
Monocytes Absolute: 0.6 10*3/uL (ref 0.1–1.0)
Monocytes Relative: 7.1 % (ref 3.0–12.0)
Neutro Abs: 7.5 10*3/uL (ref 1.4–7.7)
Neutrophils Relative %: 82.9 % — ABNORMAL HIGH (ref 43.0–77.0)
Platelets: 221 10*3/uL (ref 150.0–400.0)
RBC: 5.24 Mil/uL (ref 4.22–5.81)
RDW: 12.6 % (ref 11.5–15.5)
WBC: 9 10*3/uL (ref 4.0–10.5)

## 2013-11-28 LAB — BASIC METABOLIC PANEL
BUN: 19 mg/dL (ref 6–23)
CO2: 31 mEq/L (ref 19–32)
Calcium: 9.5 mg/dL (ref 8.4–10.5)
Chloride: 100 mEq/L (ref 96–112)
Creatinine, Ser: 0.7 mg/dL (ref 0.4–1.5)
GFR: 126.13 mL/min (ref >60.00)
Glucose, Bld: 132 mg/dL — ABNORMAL HIGH (ref 70–99)
Potassium: 4.6 mEq/L (ref 3.5–5.1)
Sodium: 137 mEq/L (ref 135–145)

## 2013-11-28 LAB — URIC ACID: Uric Acid, Serum: 4 mg/dL (ref 4.0–7.8)

## 2013-11-28 LAB — HEPATIC FUNCTION PANEL
ALT: 36 U/L (ref 0–53)
AST: 27 U/L (ref 0–37)
Albumin: 4.2 g/dL (ref 3.5–5.2)
Alkaline Phosphatase: 51 U/L (ref 39–117)
Bilirubin, Direct: 0.2 mg/dL (ref 0.0–0.3)
Total Bilirubin: 0.8 mg/dL (ref 0.2–1.2)
Total Protein: 7.3 g/dL (ref 6.0–8.3)

## 2013-11-28 LAB — RHEUMATOID FACTOR: Rhuematoid fact SerPl-aCnc: <10 IU/mL (ref <=14)

## 2013-11-28 LAB — HIGH SENSITIVITY CRP: CRP, High Sensitivity: 11.22 mg/L — ABNORMAL HIGH (ref 0.000–5.000)

## 2013-11-28 LAB — HEMOGLOBIN A1C: Hgb A1c MFr Bld: 6.4 % (ref 4.6–6.5)

## 2013-11-28 LAB — SEDIMENTATION RATE: Sed Rate: 10 mm/hr (ref 0–22)

## 2013-11-28 NOTE — Progress Notes (Signed)
[place] [address] Phone: [phone] Fax: [phone]  Patient ID: Lonnie Jones MRN: [id number], DOB: 1975/06/19, 38 y.o. Date of Encounter: 11/28/2013  Primary Physician:  Owens Loffler, MD   Chief Complaint: Foot Swelling and Wrist Pain   Subjective:   History of Present Illness:  Lonnie Jones is a 38 y.o. very pleasant male patient who presents with the following:  Feet are swollen. [facility].  All started a week ago.  He had some acute onset edema, and he also had swelling and acute pain in his wrists bilaterally as well as both ankles bilaterally. He has not had any trauma or injury. No known history of any kind rheumatological condition, no history of gout, pseudogout. He does have some baseline knee pain, and he does do quite a bit of activity for work.  He does report that his diet has been very poor, and he thinks his diabetes could potentially be worsened. His blood pressure is stable currently.  Both wrist joints hurting, both feet are swollen. A couple of weeks ago, did some minimal exercise.     Past Medical History, Surgical History, Social History, Family History, Problem List, Medications, and Allergies have been reviewed and updated if relevant.  Review of Systems:  GEN: No acute illnesses, no fevers, chills. GI: No n/v/d, eating normally Pulm: No SOB Interactive and getting along well at home.  Otherwise, ROS is as per the HPI.  Objective:   Physical Examination: BP 110/62  Pulse 78  Temp(Src) 98.3 F (36.8 C) (Oral)  Ht 5' 7.5" (1.715 m)  Wt 231 lb 4 oz (104.894 kg)  BMI 35.66 kg/m2   GEN: WDWN, NAD, Non-toxic, A & O x 3 HEENT: Atraumatic, Normocephalic. Neck supple. No masses, No LAD. Ears and Nose: No external deformity. CV: RRR, No M/G/R. No JVD. No thrill. No extra heart sounds. PULM: CTA B, no wheezes, crackles, rhonchi. No retractions. No resp. distress. No accessory muscle  use. NEURO Normal gait.  PSYCH: Normally interactive. Conversant. Not depressed or anxious appearing.  Calm demeanor.   extr 1+ edema at the feet, and trace edema in the lower extremities bilaterally.  Mild tenderness at the true wrist joint in the true ankle joint and pain with terminal motion in all these. Done significantly with compression of movement at the knees. Not at the elbows and shoulders.  Laboratory and Imaging Data:  Assessment & Plan:   Polyarthropathy - Plan: Basic metabolic panel, CBC with Differential, Sedimentation rate, High sensitivity CRP, Cyclic citrul peptide antibody, IgG, Rheumatoid factor, ANA, Uric acid  Type II or unspecified type diabetes mellitus without mention of complication, not stated as uncontrolled - Plan: Hemoglobin A1c  Edema - Plan: Hepatic function panel  Encounter for long-term (current) use of other medications - Plan: Hepatic function panel  Acute edema with polyarthropathy. Unclear etiology, will try to work it up and find a particular cause.  New Prescriptions   No medications on file   Modified Medications   No medications on file   Orders Placed This Encounter  Procedures  . Basic metabolic panel  . CBC with Differential  . Sedimentation rate  . High sensitivity CRP  . Cyclic citrul peptide antibody, IgG  . Rheumatoid factor  . ANA  . Uric acid  . Hemoglobin A1c  . Hepatic function panel   Follow-up: No Follow-up on file. Unless noted above, the patient is to follow-up if symptoms worsen. Red  flags were reviewed with the patient.  Signed,  Maud Deed. Copland, MD, [facility]   Discontinued Medications   ALBUTEROL (PROVENTIL HFA;VENTOLIN HFA) 108 (90 BASE) MCG/ACT INHALER    Inhale 2 puffs into the lungs every 6 (six) hours as needed.   PHENYLEPHRINE (SUDAFED PE) 10 MG TABS TABLET    Take 10 mg by mouth 2 (two) times daily.   Current Medications at Discharge:   Medication List       This list is  accurate as of: 11/28/13  2:00 PM.  Always use your most recent med list.               blood glucose meter kit and supplies  Relion Prime meter. pt test three times daily.250.00     fluticasone 50 MCG/ACT nasal spray  Commonly known as:  FLONASE  Place 2 sprays into both nostrils daily.     glucose blood test strip  Check BS three times a day     HYDROcodone-acetaminophen 5-325 MG per tablet  Commonly known as:  NORCO/VICODIN  Take 1-2 tablets by mouth. every 4 to 6 hours as needed.     Lancets Misc  Check BS three times a day (250.00)     meloxicam 15 MG tablet  Commonly known as:  MOBIC  TAKE ONE TABLET BY MOUTH ONCE DAILY     metFORMIN 1000 MG tablet  Commonly known as:  GLUCOPHAGE  TAKE ONE TABLET BY MOUTH TWICE DAILY WITH FOOD     pravastatin 20 MG tablet  Commonly known as:  PRAVACHOL  Take 1 tablet (20 mg total) by mouth daily.

## 2013-11-28 NOTE — Progress Notes (Signed)
Pre visit review using our clinic review tool, if applicable. No additional management support is needed unless otherwise documented below in the visit note. 

## 2013-11-29 LAB — CYCLIC CITRUL PEPTIDE ANTIBODY, IGG: Cyclic Citrullin Peptide Ab: <2 U/mL (ref 0.0–5.0)

## 2013-11-29 LAB — ANA: Anti Nuclear Antibody(ANA): NEGATIVE

## 2013-11-30 MED ORDER — PREDNISONE 20 MG PO TABS: ORAL_TABLET | ORAL | Status: DC

## 2013-11-30 NOTE — Addendum Note (Signed)
Addended by: Carter Kitten on: 11/30/2013 04:59 PM   Modules accepted: Orders

## 2013-12-29 ENCOUNTER — Other Ambulatory Visit: Payer: Self-pay | Admitting: *Deleted

## 2013-12-29 MED ORDER — ONETOUCH ULTRASOFT LANCETS MISC: Status: DC

## 2014-01-16 ENCOUNTER — Other Ambulatory Visit: Payer: Self-pay | Admitting: Family Medicine

## 2014-01-16 NOTE — Telephone Encounter (Signed)
Pt has a cpx in 3/15, and an acute ov in 6/15, has a 6 mo f/u with you in 9/15. Ok to fill?

## 2014-01-18 ENCOUNTER — Other Ambulatory Visit: Payer: Self-pay | Admitting: *Deleted

## 2014-01-18 MED ORDER — ONETOUCH DELICA LANCETS 33G MISC: Status: DC

## 2014-01-31 ENCOUNTER — Other Ambulatory Visit: Payer: Self-pay | Admitting: Family Medicine

## 2014-01-31 DIAGNOSIS — M130 Polyarthritis, unspecified: Secondary | ICD-10-CM

## 2014-02-01 ENCOUNTER — Other Ambulatory Visit (INDEPENDENT_AMBULATORY_CARE_PROVIDER_SITE_OTHER): Payer: BC Managed Care – PPO

## 2014-02-01 DIAGNOSIS — M130 Polyarthritis, unspecified: Secondary | ICD-10-CM

## 2014-02-01 LAB — HIGH SENSITIVITY CRP: CRP, High Sensitivity: 1.98 mg/L (ref 0.000–5.000)

## 2014-02-01 LAB — SEDIMENTATION RATE: Sed Rate: 5 mm/hr (ref 0–22)

## 2014-02-08 ENCOUNTER — Ambulatory Visit: Payer: BC Managed Care – PPO | Admitting: Family Medicine

## 2014-05-28 ENCOUNTER — Other Ambulatory Visit: Payer: Self-pay | Admitting: Family Medicine

## 2014-07-11 ENCOUNTER — Other Ambulatory Visit: Payer: Self-pay | Admitting: Family Medicine

## 2014-07-12 NOTE — Telephone Encounter (Signed)
Scheduled 08/14/14 Labs 08/07/14

## 2014-07-12 NOTE — Telephone Encounter (Signed)
Please scheduled CPE with fasting labs prior sometime after March 4 with Dr. Lorelei Pont.

## 2014-07-16 ENCOUNTER — Other Ambulatory Visit: Payer: Self-pay | Admitting: Family Medicine

## 2014-08-07 ENCOUNTER — Other Ambulatory Visit (INDEPENDENT_AMBULATORY_CARE_PROVIDER_SITE_OTHER): Payer: BLUE CROSS/BLUE SHIELD

## 2014-08-07 DIAGNOSIS — E785 Hyperlipidemia, unspecified: Secondary | ICD-10-CM

## 2014-08-07 DIAGNOSIS — E119 Type 2 diabetes mellitus without complications: Secondary | ICD-10-CM

## 2014-08-07 LAB — CBC WITH DIFFERENTIAL/PLATELET
Basophils Absolute: 0 10*3/uL (ref 0.0–0.1)
Basophils Relative: 0.4 % (ref 0.0–3.0)
Eosinophils Absolute: 0.1 10*3/uL (ref 0.0–0.7)
Eosinophils Relative: 1.7 % (ref 0.0–5.0)
HCT: 48.6 % (ref 39.0–52.0)
Hemoglobin: 16.9 g/dL (ref 13.0–17.0)
Lymphocytes Relative: 29.4 % (ref 12.0–46.0)
Lymphs Abs: 1.4 10*3/uL (ref 0.7–4.0)
MCHC: 34.8 g/dL (ref 30.0–36.0)
MCV: 87.4 fl (ref 78.0–100.0)
Monocytes Absolute: 0.4 10*3/uL (ref 0.1–1.0)
Monocytes Relative: 9 % (ref 3.0–12.0)
Neutro Abs: 2.8 10*3/uL (ref 1.4–7.7)
Neutrophils Relative %: 59.5 % (ref 43.0–77.0)
Platelets: 187 10*3/uL (ref 150.0–400.0)
RBC: 5.56 Mil/uL (ref 4.22–5.81)
RDW: 12.3 % (ref 11.5–15.5)
WBC: 4.7 10*3/uL (ref 4.0–10.5)

## 2014-08-07 LAB — LIPID PANEL
Cholesterol: 128 mg/dL (ref 0–200)
HDL: 34.5 mg/dL — ABNORMAL LOW (ref >39.00)
LDL Cholesterol: 72 mg/dL (ref 0–99)
NonHDL: 93.5
Total CHOL/HDL Ratio: 4
Triglycerides: 108 mg/dL (ref 0.0–149.0)
VLDL: 21.6 mg/dL (ref 0.0–40.0)

## 2014-08-07 LAB — HEMOGLOBIN A1C: Hgb A1c MFr Bld: 6.7 % — ABNORMAL HIGH (ref 4.6–6.5)

## 2014-08-07 LAB — COMPREHENSIVE METABOLIC PANEL
ALT: 36 U/L (ref 0–53)
AST: 15 U/L (ref 0–37)
Albumin: 4.5 g/dL (ref 3.5–5.2)
Alkaline Phosphatase: 52 U/L (ref 39–117)
BUN: 24 mg/dL — ABNORMAL HIGH (ref 6–23)
CO2: 30 mEq/L (ref 19–32)
Calcium: 9.7 mg/dL (ref 8.4–10.5)
Chloride: 102 mEq/L (ref 96–112)
Creatinine, Ser: 0.78 mg/dL (ref 0.40–1.50)
GFR: 118.26 mL/min (ref >60.00)
Glucose, Bld: 185 mg/dL — ABNORMAL HIGH (ref 70–99)
Potassium: 5.3 mEq/L — ABNORMAL HIGH (ref 3.5–5.1)
Sodium: 137 mEq/L (ref 135–145)
Total Bilirubin: 1 mg/dL (ref 0.2–1.2)
Total Protein: 6.7 g/dL (ref 6.0–8.3)

## 2014-08-07 LAB — TSH: TSH: 0.54 u[IU]/mL (ref 0.35–4.50)

## 2014-08-14 ENCOUNTER — Encounter: Payer: Self-pay | Admitting: Family Medicine

## 2014-08-14 ENCOUNTER — Ambulatory Visit (INDEPENDENT_AMBULATORY_CARE_PROVIDER_SITE_OTHER): Payer: BLUE CROSS/BLUE SHIELD | Admitting: Family Medicine

## 2014-08-14 VITALS — BP 110/68 | HR 76 | Temp 97.5°F | Ht 67.0 in | Wt 230.0 lb

## 2014-08-14 DIAGNOSIS — Z0000 Encounter for general adult medical examination without abnormal findings: Secondary | ICD-10-CM

## 2014-08-14 MED ORDER — TRIAMCINOLONE ACETONIDE 0.1 % EX CREA: 1.0000 "application " | TOPICAL_CREAM | Freq: Two times a day (BID) | CUTANEOUS | Status: DC

## 2014-08-14 NOTE — Progress Notes (Signed)
Dr. Frederico Hamman T. Azure Barrales, MD, [facility] [place], [address] Phone: [phone] Fax: [phone]  08/14/2014  Patient: Lonnie Jones, MRN: [id number], DOB: 30-May-1976, 39 y.o.  Primary Physician:  Owens Loffler, MD  Chief Complaint: Annual Exam  Subjective:   Lonnie Jones is a 39 y.o. pleasant patient who presents with the following:  Preventative Health Maintenance Visit:  Health Maintenance Summary Reviewed and updated, unless pt declines services.  Tobacco History Reviewed. Alcohol: No concerns, no excessive use Exercise Habits: "at work" only STD concerns: no risk or activity to increase risk Drug Use: None Encouraged self-testicular check  Some cramping in legs at night Add salt  Itching around his rectum  Diabetes Mellitus: Tolerating Medications: yes Compliance with diet: fair/poor Exercise: minimal / intermittent Avg blood sugars at home: not checking Foot problems: none Hypoglycemia: none No nausea, vomitting, blurred vision, polyuria.  Lab Results  Component Value Date   HGBA1C 6.7* 08/07/2014   HGBA1C 6.4 11/28/2013   HGBA1C 5.7 07/27/2013   Lab Results  Component Value Date   MICROALBUR 1.7 07/27/2013   LDLCALC 72 08/07/2014   CREATININE 0.78 08/07/2014    Wt Readings from Last 3 Encounters:  08/14/14 230 lb (104.327 kg)  11/28/13 231 lb 4 oz (104.894 kg)  08/03/13 222 lb 4 oz (100.812 kg)    Body mass index is 36.01 kg/(m^2).   Health Maintenance  Topic Date Due  . HIV Screening  05/19/1991  . FOOT EXAM  07/28/2013  . INFLUENZA VACCINE  12/31/2013  . URINE MICROALBUMIN  07/27/2014  . HEMOGLOBIN A1C  02/07/2015  . OPHTHALMOLOGY EXAM  08/14/2015  . PNEUMOCOCCAL POLYSACCHARIDE VACCINE (2) 07/28/2017  . TETANUS/TDAP  07/28/2022   Immunization History  Administered Date(s) Administered  . Influenza, Seasonal, Injecte, Preservative Fre 07/28/2012  . Influenza,inj,Quad  PF,36+ Mos 01/26/2013  . Pneumococcal Polysaccharide-23 07/28/2012  . Tdap 07/28/2012   Patient Active Problem List   Diagnosis Date Noted  . Allergic rhinitis due to allergen 08/25/2011  . Hyperlipidemia LDL goal < 70 12/16/2010  . Diabetes mellitus 08/08/2010  . MIGRAINE HEADACHE 08/08/2010  . ASTHMA 08/08/2010  . GERD 08/08/2010   Past Medical History  Diagnosis Date  . GERD (gastroesophageal reflux disease)   . Diabetes mellitus   . Asthma   . Migraine headache   . Allergy   . Hyperlipidemia LDL goal < 70 12/16/2010  . Allergic rhinitis due to allergen 08/25/2011   Past Surgical History  Procedure Laterality Date  . Appendectomy  2000  . Carpal tunnel release    . Knee surgery  2011   History   Social History  . Marital Status: Married    Spouse Name: N/A  . Number of Children: N/A  . Years of Education: N/A   Occupational History  . Not on file.   Social History Main Topics  . Smoking status: Former Research scientist (life sciences)  . Smokeless tobacco: Current User    Types: Snuff     Comment: quit 2003  . Alcohol Use: No  . Drug Use: No  . Sexual Activity: Not on file   Other Topics Concern  . Not on file   Social History Narrative   No family history on file. Allergies  Allergen Reactions  . Codeine     Medication list has been reviewed and updated.   General: Denies fever, chills, sweats. No significant weight loss. Eyes: Denies blurring,significant itching ENT: Denies earache, sore  throat, and hoarseness. Cardiovascular: Denies chest pains, palpitations, dyspnea on exertion Respiratory: Denies cough, dyspnea at rest,wheeezing Breast: no concerns about lumps GI: Denies nausea, vomiting, diarrhea, constipation, change in bowel habits, abdominal pain, melena, hematochezia. Anal itching and irritation GU: Denies penile discharge, ED, urinary flow / outflow problems. No STD concerns. Musculoskeletal: Denies back pain, joint pain Derm: Denies rash, itching Neuro:  Denies  paresthesias, frequent falls, frequent headaches Psych: Denies depression, anxiety Endocrine: Denies cold intolerance, heat intolerance, polydipsia Heme: Denies enlarged lymph nodes Allergy: No hayfever  Objective:   BP 110/68 mmHg  Pulse 76  Temp(Src) 97.5 F (36.4 C) (Oral)  Ht '5\' 7"'  (1.702 m)  Wt 230 lb (104.327 kg)  BMI 36.01 kg/m2 Ideal Body Weight: Weight in (lb) to have BMI = 25: 159.3  No exam data present  GEN: well developed, well nourished, no acute distress Eyes: conjunctiva and lids normal, PERRLA, EOMI ENT: TM clear, nares clear, oral exam WNL Neck: supple, no lymphadenopathy, no thyromegaly, no JVD Pulm: clear to auscultation and percussion, respiratory effort normal CV: regular rate and rhythm, S1-S2, no murmur, rub or gallop, no bruits, peripheral pulses normal and symmetric, no cyanosis, clubbing, edema or varicosities GI: soft, non-tender; no hepatosplenomegaly, masses; active bowel sounds all quadrants. Anal irritation GU: no hernia, testicular mass, penile discharge Lymph: no cervical, axillary or inguinal adenopathy MSK: gait normal, muscle tone and strength WNL, no joint swelling, effusions, discoloration, crepitus  SKIN: clear, good turgor, color WNL, no rashes, lesions, or ulcerations Neuro: normal mental status, normal strength, sensation, and motion Psych: alert; oriented to person, place and time, normally interactive and not anxious or depressed in appearance. All labs reviewed with patient.  Lipids:    Component Value Date/Time   CHOL 128 08/07/2014 0904   TRIG 108.0 08/07/2014 0904   HDL 34.50* 08/07/2014 0904   LDLDIRECT 108.0 12/10/2010 0820   VLDL 21.6 08/07/2014 0904   CHOLHDL 4 08/07/2014 0904   CBC: CBC Latest Ref Rng 08/07/2014 11/28/2013 07/27/2013  WBC 4.0 - 10.5 K/uL 4.7 9.0 5.4  Hemoglobin 13.0 - 17.0 g/dL 16.9 15.9 17.3(H)  Hematocrit 39.0 - 52.0 % 48.6 46.6 51.5  Platelets 150.0 - 400.0 K/uL 187.0 221.0 206.0    Basic  Metabolic Panel:    Component Value Date/Time   NA 137 08/07/2014 0904   K 5.3* 08/07/2014 0904   CL 102 08/07/2014 0904   CO2 30 08/07/2014 0904   BUN 24* 08/07/2014 0904   CREATININE 0.78 08/07/2014 0904   GLUCOSE 185* 08/07/2014 0904   CALCIUM 9.7 08/07/2014 0904   Hepatic Function Latest Ref Rng 08/07/2014 11/28/2013 07/27/2013  Total Protein 6.0 - 8.3 g/dL 6.7 7.3 6.8  Albumin 3.5 - 5.2 g/dL 4.5 4.2 4.3  AST 0 - 37 U/L '15 27 24  ' ALT 0 - 53 U/L 36 36 53  Alk Phosphatase 39 - 117 U/L 52 51 56  Total Bilirubin 0.2 - 1.2 mg/dL 1.0 0.8 0.9  Bilirubin, Direct 0.0 - 0.3 mg/dL - 0.2 0.2    Lab Results  Component Value Date   TSH 0.54 08/07/2014   No results found for: PSA  Assessment and Plan:   Routine general medical examination at a health care facility  Health Maintenance Exam: The patient's preventative maintenance and recommended screening tests for an annual wellness exam were reviewed in full today. Brought up to date unless services declined.  Counselled on the importance of diet, exercise, and its role in overall health and mortality. The  patient's FH and SH was reviewed, including their home life, tobacco status, and drug and alcohol status.  Irritation around rectal area, TAC sparingly no more than 10 d in a row DM doing well  Follow-up: Return in 6 months (on 02/14/2015). Unless noted, follow-up in 1 year for Health Maintenance Exam.  New Prescriptions   TRIAMCINOLONE CREAM (KENALOG) 0.1 %    Apply 1 application topically 2 (two) times daily. Max 2 weeks   No orders of the defined types were placed in this encounter.    Signed,  Maud Deed. Aul Mangieri, MD   Patient's Medications  New Prescriptions   TRIAMCINOLONE CREAM (KENALOG) 0.1 %    Apply 1 application topically 2 (two) times daily. Max 2 weeks  Previous Medications   BLOOD GLUCOSE MONITORING SUPPL (BLOOD GLUCOSE METER) KIT    Relion Prime meter. pt test three times daily.250.00   FLUTICASONE  (FLONASE) 50 MCG/ACT NASAL SPRAY    Place 2 sprays into both nostrils daily.   MELOXICAM (MOBIC) 15 MG TABLET    TAKE ONE TABLET BY MOUTH ONCE DAILY   METFORMIN (GLUCOPHAGE) 1000 MG TABLET    TAKE ONE TABLET BY MOUTH TWICE DAILY WITH FOOD   ONE TOUCH ULTRA TEST TEST STRIP    USE TO TEST BLOOD SUGAR TWO TO THREE TIMES DAILY AS DIRECTED   ONETOUCH DELICA LANCETS 97C MISC    Use to check blood sugars 2 to 3 times a day.  Dx: 250.00   PRAVASTATIN (PRAVACHOL) 20 MG TABLET    TAKE ONE TABLET BY MOUTH ONCE DAILY  Modified Medications   No medications on file  Discontinued Medications   HYDROCODONE-ACETAMINOPHEN (NORCO/VICODIN) 5-325 MG PER TABLET    Take 1-2 tablets by mouth. every 4 to 6 hours as needed.   POTASSIUM PO    Take 1 tablet by mouth daily.   PREDNISONE (DELTASONE) 20 MG TABLET    Prednisone 20 mg, 2 tabs po daily for 4 days, then 1 po daily for 4 days

## 2014-08-14 NOTE — Progress Notes (Signed)
Pre visit review using our clinic review tool, if applicable. No additional management support is needed unless otherwise documented below in the visit note. 

## 2014-09-14 LAB — HM DIABETES EYE EXAM

## 2014-09-26 ENCOUNTER — Encounter: Payer: Self-pay | Admitting: Family Medicine

## 2014-10-02 ENCOUNTER — Other Ambulatory Visit: Payer: Self-pay | Admitting: Family Medicine

## 2014-11-15 ENCOUNTER — Encounter: Payer: Self-pay | Admitting: Primary Care

## 2014-11-15 ENCOUNTER — Ambulatory Visit (INDEPENDENT_AMBULATORY_CARE_PROVIDER_SITE_OTHER): Payer: BLUE CROSS/BLUE SHIELD | Admitting: Primary Care

## 2014-11-15 VITALS — BP 126/72 | HR 70 | Temp 97.9°F | Wt 227.5 lb

## 2014-11-15 DIAGNOSIS — J019 Acute sinusitis, unspecified: Secondary | ICD-10-CM

## 2014-11-15 MED ORDER — AMOXICILLIN-POT CLAVULANATE 875-125 MG PO TABS: 1.0000 | ORAL_TABLET | Freq: Two times a day (BID) | ORAL | Status: DC

## 2014-11-15 NOTE — Patient Instructions (Signed)
Start Augmentin antibiotics. Take 1 tablet by mouth twice daily for 10 days. Drink plenty of water (3 liters daily) while working outside. Please apply sunscreen!  Call if no improvement in the next 4-5 days. It was nice meeting you!  Sinusitis Sinusitis is redness, soreness, and inflammation of the paranasal sinuses. Paranasal sinuses are air pockets within the bones of your face (beneath the eyes, the middle of the forehead, or above the eyes). In healthy paranasal sinuses, mucus is able to drain out, and air is able to circulate through them by way of your nose. However, when your paranasal sinuses are inflamed, mucus and air can become trapped. This can allow bacteria and other germs to grow and cause infection. Sinusitis can develop quickly and last only a short time (acute) or continue over a long period (chronic). Sinusitis that lasts for more than 12 weeks is considered chronic.  CAUSES  Causes of sinusitis include:  Allergies.  Structural abnormalities, such as displacement of the cartilage that separates your nostrils (deviated septum), which can decrease the air flow through your nose and sinuses and affect sinus drainage.  Functional abnormalities, such as when the small hairs (cilia) that line your sinuses and help remove mucus do not work properly or are not present. SIGNS AND SYMPTOMS  Symptoms of acute and chronic sinusitis are the same. The primary symptoms are pain and pressure around the affected sinuses. Other symptoms include:  Upper toothache.  Earache.  Headache.  Bad breath.  Decreased sense of smell and taste.  A cough, which worsens when you are lying flat.  Fatigue.  Fever.  Thick drainage from your nose, which often is green and may contain pus (purulent).  Swelling and warmth over the affected sinuses. DIAGNOSIS  Your health care provider will perform a physical exam. During the exam, your health care provider may:  Look in your nose for signs  of abnormal growths in your nostrils (nasal polyps).  Tap over the affected sinus to check for signs of infection.  View the inside of your sinuses (endoscopy) using an imaging device that has a light attached (endoscope). If your health care provider suspects that you have chronic sinusitis, one or more of the following tests may be recommended:  Allergy tests.  Nasal culture. A sample of mucus is taken from your nose, sent to a lab, and screened for bacteria.  Nasal cytology. A sample of mucus is taken from your nose and examined by your health care provider to determine if your sinusitis is related to an allergy. TREATMENT  Most cases of acute sinusitis are related to a viral infection and will resolve on their own within 10 days. Sometimes medicines are prescribed to help relieve symptoms (pain medicine, decongestants, nasal steroid sprays, or saline sprays).  However, for sinusitis related to a bacterial infection, your health care provider will prescribe antibiotic medicines. These are medicines that will help kill the bacteria causing the infection.  Rarely, sinusitis is caused by a fungal infection. In theses cases, your health care provider will prescribe antifungal medicine. For some cases of chronic sinusitis, surgery is needed. Generally, these are cases in which sinusitis recurs more than 3 times per year, despite other treatments. HOME CARE INSTRUCTIONS   Drink plenty of water. Water helps thin the mucus so your sinuses can drain more easily.  Use a humidifier.  Inhale steam 3 to 4 times a day (for example, sit in the bathroom with the shower running).  Apply a warm, moist  washcloth to your face 3 to 4 times a day, or as directed by your health care provider.  Use saline nasal sprays to help moisten and clean your sinuses.  Take medicines only as directed by your health care provider.  If you were prescribed either an antibiotic or antifungal medicine, finish it all even  if you start to feel better. SEEK IMMEDIATE MEDICAL CARE IF:  You have increasing pain or severe headaches.  You have nausea, vomiting, or drowsiness.  You have swelling around your face.  You have vision problems.  You have a stiff neck.  You have difficulty breathing. MAKE SURE YOU:   Understand these instructions.  Will watch your condition.  Will get help right away if you are not doing well or get worse. Document Released: 05/19/2005 Document Revised: 10/03/2013 Document Reviewed: 06/03/2011 [facility] Patient Information 2015 [place], [place]. This information is not intended to replace advice given to you by your health care provider. Make sure you discuss any questions you have with your health care provider.

## 2014-11-15 NOTE — Progress Notes (Signed)
Pre visit review using our clinic review tool, if applicable. No additional management support is needed unless otherwise documented below in the visit note. 

## 2014-11-15 NOTE — Progress Notes (Signed)
Subjective:    Patient ID: Lonnie Jones, male    DOB: 1976-03-04, 39 y.o.   MRN: [id number]  HPI  Lonnie Jones is a 39 year old male who presents today with a chief complaint of sinus pressure and nasal congestion. He also reports post nasal drip and cough. His symptoms have been present intermittently for the past 4 weeks with worsening symptoms last Wednesday. He's tried taking sudaphed, mucinex DM, claritin, allegra-D without any relief. Denies fevers, chest pain.  Review of Systems  Constitutional: Negative for fever and chills.  HENT: Positive for congestion, postnasal drip, sinus pressure and sore throat. Negative for ear pain.   Eyes: Positive for itching.  Respiratory: Positive for cough and shortness of breath.   Musculoskeletal: Negative for myalgias.       Past Medical History  Diagnosis Date  . GERD (gastroesophageal reflux disease)   . Diabetes mellitus   . Asthma   . Migraine headache   . Allergy   . Hyperlipidemia LDL goal < 70 12/16/2010  . Allergic rhinitis due to allergen 08/25/2011    History   Social History  . Marital Status: Married    Spouse Name: N/A  . Number of Children: N/A  . Years of Education: N/A   Occupational History  . Not on file.   Social History Main Topics  . Smoking status: Former Smoker    Quit date: 08/19/2001  . Smokeless tobacco: Current User    Types: Snuff  . Alcohol Use: No  . Drug Use: No  . Sexual Activity: Not on file   Other Topics Concern  . Not on file   Social History Narrative    Past Surgical History  Procedure Laterality Date  . Appendectomy  2000  . Carpal tunnel release    . Knee surgery  2011    No family history on file.  Allergies  Allergen Reactions  . Codeine     Current Outpatient Prescriptions on File Prior to Visit  Medication Sig Dispense Refill  . Blood Glucose Monitoring Suppl (BLOOD GLUCOSE METER) kit Relion Prime meter. pt test three times daily.250.00 1 each 0  . fluticasone  (FLONASE) 50 MCG/ACT nasal spray USE TWO SPRAY(S) IN EACH NOSTRIL ONCE DAILY 48 g 3  . meloxicam (MOBIC) 15 MG tablet TAKE ONE TABLET BY MOUTH ONCE DAILY 90 tablet 3  . metFORMIN (GLUCOPHAGE) 1000 MG tablet TAKE ONE TABLET BY MOUTH TWICE DAILY WITH FOOD 180 tablet 1  . ONE TOUCH ULTRA TEST test strip USE TO TEST BLOOD SUGAR TWO TO THREE TIMES DAILY AS DIRECTED 100 each 11  . ONETOUCH DELICA LANCETS 31S MISC Use to check blood sugars 2 to 3 times a day.  Dx: 250.00 100 each 11  . pravastatin (PRAVACHOL) 20 MG tablet TAKE ONE TABLET BY MOUTH ONCE DAILY 90 tablet 3  . triamcinolone cream (KENALOG) 0.1 % Apply 1 application topically 2 (two) times daily. Max 2 weeks (Patient not taking: Reported on 11/15/2014) 45 g 1   No current facility-administered medications on file prior to visit.    BP 126/72 mmHg  Pulse 70  Temp(Src) 97.9 F (36.6 C) (Oral)  Wt 227 lb 8 oz (103.193 kg)  SpO2 97%    Objective:   Physical Exam  Constitutional: He does not appear ill.  HENT:  Right Ear: Tympanic membrane and ear canal normal.  Left Ear: Tympanic membrane and ear canal normal.  Nose: Right sinus exhibits maxillary sinus tenderness. Right sinus exhibits  no frontal sinus tenderness. Left sinus exhibits maxillary sinus tenderness. Left sinus exhibits no frontal sinus tenderness.  Mouth/Throat: Oropharynx is clear and moist.  Eyes: Conjunctivae are normal. Pupils are equal, round, and reactive to light.  Neck: Neck supple.  Cardiovascular: Normal rate and regular rhythm.   Pulmonary/Chest: Effort normal and breath sounds normal.  Lymphadenopathy:    He has no cervical adenopathy.  Skin: Skin is warm and dry.          Assessment & Plan:  Sinusitis:  4 weeks nasal congestion, sinus pressure. Same symptoms now worse since 1 week ago with postnasal drip and cough. Maxillary sinuses tender. RX for Augmentin BID x 10 days. Robitussin OTC for cough. Push fluids. Follow up PRN

## 2014-12-04 ENCOUNTER — Other Ambulatory Visit: Payer: Self-pay | Admitting: Family Medicine

## 2014-12-28 ENCOUNTER — Other Ambulatory Visit: Payer: Self-pay | Admitting: Family Medicine

## 2014-12-28 NOTE — Telephone Encounter (Signed)
Last office visit 11/15/2014 with Allie Bossier. Last CPE 08/14/2014.  Last refilled 01/16/2014 for #90 with 3 refills.  Ok to refill?

## 2015-02-09 ENCOUNTER — Other Ambulatory Visit (INDEPENDENT_AMBULATORY_CARE_PROVIDER_SITE_OTHER): Payer: BLUE CROSS/BLUE SHIELD

## 2015-02-09 DIAGNOSIS — E119 Type 2 diabetes mellitus without complications: Secondary | ICD-10-CM | POA: Diagnosis not present

## 2015-02-09 LAB — HEMOGLOBIN A1C: Hgb A1c MFr Bld: 7 % — ABNORMAL HIGH (ref 4.6–6.5)

## 2015-02-12 ENCOUNTER — Encounter (INDEPENDENT_AMBULATORY_CARE_PROVIDER_SITE_OTHER): Payer: Self-pay

## 2015-02-14 ENCOUNTER — Ambulatory Visit: Payer: BLUE CROSS/BLUE SHIELD | Admitting: Family Medicine

## 2015-02-15 ENCOUNTER — Encounter: Payer: Self-pay | Admitting: Family Medicine

## 2015-02-15 ENCOUNTER — Ambulatory Visit (INDEPENDENT_AMBULATORY_CARE_PROVIDER_SITE_OTHER): Payer: BLUE CROSS/BLUE SHIELD | Admitting: Family Medicine

## 2015-02-15 VITALS — BP 100/68 | HR 71 | Temp 97.9°F | Ht 67.0 in | Wt 234.2 lb

## 2015-02-15 DIAGNOSIS — Z23 Encounter for immunization: Secondary | ICD-10-CM | POA: Diagnosis not present

## 2015-02-15 DIAGNOSIS — E1142 Type 2 diabetes mellitus with diabetic polyneuropathy: Secondary | ICD-10-CM

## 2015-02-15 DIAGNOSIS — E1143 Type 2 diabetes mellitus with diabetic autonomic (poly)neuropathy: Secondary | ICD-10-CM

## 2015-02-15 DIAGNOSIS — E1140 Type 2 diabetes mellitus with diabetic neuropathy, unspecified: Secondary | ICD-10-CM

## 2015-02-15 DIAGNOSIS — E785 Hyperlipidemia, unspecified: Secondary | ICD-10-CM

## 2015-02-15 MED ORDER — GABAPENTIN 300 MG PO CAPS: 300.0000 mg | ORAL_CAPSULE | Freq: Two times a day (BID) | ORAL | Status: DC

## 2015-02-15 NOTE — Progress Notes (Signed)
Dr. Frederico Hamman T. Timothea Bodenheimer, MD, [facility] [place], [address] Phone: [phone] Fax: [phone]  02/15/2015  Patient: Lonnie Jones, MRN: [id number], DOB: 1975/08/02, 39 y.o.  Primary Physician:  Owens Loffler, MD  Chief Complaint: Diabetes  Subjective:   Lonnie Jones is a 39 y.o. very pleasant male patient who presents with the following:  Diabetes Mellitus: Tolerating Medications: yes Compliance with diet: poor Exercise: minimal / intermittent Avg blood sugars at home: not checking Foot problems: none Hypoglycemia: none No nausea, vomitting, blurred vision, polyuria.  Lab Results  Component Value Date   HGBA1C 7.0* 02/09/2015   HGBA1C 6.7* 08/07/2014   HGBA1C 6.4 11/28/2013   Lab Results  Component Value Date   MICROALBUR 1.7 07/27/2013   LDLCALC 72 08/07/2014   CREATININE 0.78 08/07/2014    Wt Readings from Last 3 Encounters:  02/15/15 234 lb 4 oz (106.255 kg)  11/15/14 227 lb 8 oz (103.193 kg)  08/14/14 230 lb (104.327 kg)    Body mass index is 36.68 kg/(m^2).   New onset B neuropathy bothering him every day, B feet  Lipids: Doing well, stable. Tolerating meds fine with no SE. Panel reviewed with patient.  Lipids:    Component Value Date/Time   CHOL 128 08/07/2014 0904   TRIG 108.0 08/07/2014 0904   HDL 34.50* 08/07/2014 0904   LDLDIRECT 108.0 12/10/2010 0820   VLDL 21.6 08/07/2014 0904   CHOLHDL 4 08/07/2014 0904    Lab Results  Component Value Date   ALT 36 08/07/2014   AST 15 08/07/2014   ALKPHOS 52 08/07/2014   BILITOT 1.0 08/07/2014     Past Medical History, Surgical History, Social History, Family History, Problem List, Medications, and Allergies have been reviewed and updated if relevant.  Patient Active Problem List   Diagnosis Date Noted  . Allergic rhinitis due to allergen 08/25/2011  . Hyperlipidemia LDL goal < 70 12/16/2010  . Diabetes mellitus 08/08/2010  .  MIGRAINE HEADACHE 08/08/2010  . ASTHMA 08/08/2010  . GERD 08/08/2010    Past Medical History  Diagnosis Date  . GERD (gastroesophageal reflux disease)   . Diabetes mellitus   . Asthma   . Migraine headache   . Allergy   . Hyperlipidemia LDL goal < 70 12/16/2010  . Allergic rhinitis due to allergen 08/25/2011    Past Surgical History  Procedure Laterality Date  . Appendectomy  2000  . Carpal tunnel release    . Knee surgery  2011    Social History   Social History  . Marital Status: Married    Spouse Name: N/A  . Number of Children: N/A  . Years of Education: N/A   Occupational History  . Not on file.   Social History Main Topics  . Smoking status: Former Smoker    Quit date: 08/19/2001  . Smokeless tobacco: Current User    Types: Snuff  . Alcohol Use: No  . Drug Use: No  . Sexual Activity: Not on file   Other Topics Concern  . Not on file   Social History Narrative    No family history on file.  Allergies  Allergen Reactions  . Codeine     Medication list reviewed and updated in full in [facility].   GEN: No acute illnesses, no fevers, chills. GI: No n/v/d, eating normally Pulm: No SOB Interactive and getting along well at home.  Otherwise, ROS is as per the HPI.  Objective:   BP 100/68 mmHg  Pulse 71  Temp(Src) 97.9 F (36.6 C) (Oral)  Ht _0  (1.702 m)  Wt 234 lb 4 oz (106.255 kg)  BMI 36.68 kg/m2  GEN: WDWN, NAD, Non-toxic, A & O x 3 HEENT: Atraumatic, Normocephalic. Neck supple. No masses, No LAD. Ears and Nose: No external deformity. CV: RRR, No M/G/R. No JVD. No thrill. No extra heart sounds. PULM: CTA B, no wheezes, crackles, rhonchi. No retractions. No resp. distress. No accessory muscle use. EXTR: No c/c/e NEURO Normal gait.  PSYCH: Normally interactive. Conversant. Not depressed or anxious appearing.  Calm demeanor.   Diabetic foot exam: Normal inspection No skin breakdown No calluses  Normal DP pulses Normal  sensation to light tough Nails normal   Laboratory and Imaging Data: Results for orders placed or performed in visit on 02/09/15  Hemoglobin A1c  Result Value Ref Range   Hgb A1c MFr Bld 7.0 (H) 4.6 - 6.5 %     Assessment and Plan:   Type 2 diabetes mellitus with peripheral neuropathy  Need for prophylactic vaccination and inoculation against influenza - Plan: Flu Vaccine QUAD 36+ mos IM  Diabetic autonomic neuropathy associated with type 2 diabetes mellitus  Hyperlipidemia with target LDL less than 70  DM doing well New onset neuropathy, painful, start relatively low dose neurontin and titrate up  Follow-up: 6 mo CPX  New Prescriptions   GABAPENTIN (NEURONTIN) 300 MG CAPSULE    Take 1 capsule (300 mg total) by mouth 2 (two) times daily.   Orders Placed This Encounter  Procedures  . Flu Vaccine QUAD 36+ mos IM    Signed,  Sharif Rendell T. Zyad Boomer, MD   Patient's Medications  New Prescriptions   GABAPENTIN (NEURONTIN) 300 MG CAPSULE    Take 1 capsule (300 mg total) by mouth 2 (two) times daily.  Previous Medications   BLOOD GLUCOSE MONITORING SUPPL (BLOOD GLUCOSE METER) KIT    Relion Prime meter. pt test three times daily.250.00   FLUTICASONE (FLONASE) 50 MCG/ACT NASAL SPRAY    USE TWO SPRAY(S) IN EACH NOSTRIL ONCE DAILY   MELOXICAM (MOBIC) 15 MG TABLET    TAKE ONE TABLET BY MOUTH ONCE DAILY   METFORMIN (GLUCOPHAGE) 1000 MG TABLET    TAKE ONE TABLET BY MOUTH TWICE DAILY WITH FOOD   ONE TOUCH ULTRA TEST TEST STRIP    USE TO TEST BLOOD SUGAR TWO TO THREE TIMES DAILY AS DIRECTED   ONETOUCH DELICA LANCETS 33T MISC    Use to check blood sugars 2 to 3 times a day.  Dx: 250.00   PRAVASTATIN (PRAVACHOL) 20 MG TABLET    TAKE ONE TABLET BY MOUTH ONCE DAILY   TRIAMCINOLONE CREAM (KENALOG) 0.1 %    Apply 1 application topically 2 (two) times daily. Max 2 weeks  Modified Medications   No medications on file  Discontinued Medications   AMOXICILLIN-CLAVULANATE (AUGMENTIN) 875-125 MG  PER TABLET    Take 1 tablet by mouth 2 (two) times daily.

## 2015-02-15 NOTE — Progress Notes (Signed)
Pre visit review using our clinic review tool, if applicable. No additional management support is needed unless otherwise documented below in the visit note. 

## 2015-02-16 ENCOUNTER — Encounter: Payer: Self-pay | Admitting: Family Medicine

## 2015-02-16 DIAGNOSIS — E1143 Type 2 diabetes mellitus with diabetic autonomic (poly)neuropathy: Secondary | ICD-10-CM | POA: Insufficient documentation

## 2015-05-09 ENCOUNTER — Other Ambulatory Visit: Payer: Self-pay | Admitting: Family Medicine

## 2015-07-19 ENCOUNTER — Ambulatory Visit (INDEPENDENT_AMBULATORY_CARE_PROVIDER_SITE_OTHER): Payer: BLUE CROSS/BLUE SHIELD | Admitting: Family Medicine

## 2015-07-19 ENCOUNTER — Encounter: Payer: Self-pay | Admitting: Family Medicine

## 2015-07-19 VITALS — BP 120/74 | HR 88 | Temp 98.2°F | Ht 67.0 in | Wt 236.2 lb

## 2015-07-19 DIAGNOSIS — J329 Chronic sinusitis, unspecified: Secondary | ICD-10-CM

## 2015-07-19 MED ORDER — GLUCOSE BLOOD VI STRP: ORAL_STRIP | Status: DC

## 2015-07-19 NOTE — Progress Notes (Signed)
Dr. Frederico Hamman T. Mavin Dyke, MD, [facility] [place], [address] Phone: [phone] Fax: [phone]  07/19/2015  Patient: Lonnie Jones, MRN: [id number], DOB: 1975-07-26, 40 y.o.  Primary Physician:  Owens Loffler, MD   Chief Complaint  Patient presents with  . Cough    with yellow phelgm never has gone away since Christmas  . Nasal Congestion  . Headache   Subjective:   This 40 y.o. male patient presents with chronic sinus pain, nasal discharge and drainage as well as headache that is been present since December.  Around late December, the patient took a course of Levaquin which mildly improved his symptoms which prompted returned.  He also historically has been having problems with his sinuses and allergies for a long time, on the order of years.  He constantly is taking antihistamines, decongestants, as well as inhaled steroids.  PMH, PHS, Allergies, Problem List, Medications, Family History, and Social History have all been reviewed.  Patient Active Problem List   Diagnosis Date Noted  . Peripheral autonomic neuropathy due to diabetes mellitus ([place]) 02/16/2015  . Allergic rhinitis due to allergen 08/25/2011  . Hyperlipidemia with target LDL less than 70 12/16/2010  . Type 2 diabetes mellitus with peripheral neuropathy ([place]) 08/08/2010  . MIGRAINE HEADACHE 08/08/2010  . ASTHMA 08/08/2010  . GERD 08/08/2010    Past Medical History  Diagnosis Date  . GERD (gastroesophageal reflux disease)   . Diabetes mellitus   . Asthma   . Migraine headache   . Allergy   . Hyperlipidemia LDL goal < 70 12/16/2010  . Allergic rhinitis due to allergen 08/25/2011  . Type 2 diabetes mellitus with peripheral neuropathy ([place]) 08/08/2010    Qualifier: Diagnosis of  By: Lorelei Pont MD, Frederico Hamman      Past Surgical History  Procedure Laterality Date  . Appendectomy  2000  . Carpal tunnel release    . Knee surgery  2011    Social  History   Social History  . Marital Status: Married    Spouse Name: N/A  . Number of Children: N/A  . Years of Education: N/A   Occupational History  . Not on file.   Social History Main Topics  . Smoking status: Former Smoker    Quit date: 08/19/2001  . Smokeless tobacco: Current User    Types: Snuff  . Alcohol Use: No  . Drug Use: No  . Sexual Activity: Not on file   Other Topics Concern  . Not on file   Social History Narrative    No family history on file.  Allergies  Allergen Reactions  . Codeine     Medication list reviewed and updated in full in [place].  ROS as above, eating and drinking - tolerating PO. Urinating normally. No excessive vomitting or diarrhea. O/w as above.  Objective:   Blood pressure 120/74, pulse 88, temperature 98.2 F (36.8 C), temperature source Oral, height 5' 7" (1.702 m), weight 236 lb 4 oz (107.162 kg), SpO2 96 %.  GEN: WDWN, Non-toxic, Atraumatic, normocephalic. A and O x 3. HEENT: Oropharynx clear without exudate, MMM, no significant LAD, mild rhinnorhea Sinuses: Right Frontal, ethmoid, and maxillary: Tender Left Frontal, Ethmoid, and maxillary: Tender Ears: TM clear, COL visualized with good landmarks CV: RRR, no m/g/r. Pulm: CTA B, no wheezes, rhonchi, or crackles, normal respiratory effort. EXT: no c/c/e Psych: well oriented, neither depressed nor anxious in appearance  Assessment and Plan:  Recurrent sinusitis - Plan: Ambulatory referral to ENT  Chronic sinusitis, recurrent in nature with multiple episodes of sinusitis over time.  Constant symptoms since middle of December with failure of treatment of Levaquin.  Fairly constant pressure and pain in his sinuses and frontal head region since then.  Historically multiple episodes in the past, requiring intranasal steroids, oral antihistamines and decongestants most of the time.  Even doing these, symptomatic most of the time.  I think involving ENT here would  be helpful.  Follow-up: No Follow-up on file.   Modified Medications   Modified Medication Previous Medication   GLUCOSE BLOOD (ONE TOUCH ULTRA TEST) TEST STRIP ONE TOUCH ULTRA TEST test strip      USE TO TEST BLOOD SUGAR TWO TO THREE TIMES DAILY AS DIRECTED    USE TO TEST BLOOD SUGAR TWO TO THREE TIMES DAILY AS DIRECTED   Orders Placed This Encounter  Procedures  . Ambulatory referral to ENT    Signed,  Frederico Hamman T. Kenly Xiao, MD  Patient's Medications  New Prescriptions   No medications on file  Previous Medications   BLOOD GLUCOSE MONITORING SUPPL (BLOOD GLUCOSE METER) KIT    Relion Prime meter. pt test three times daily.250.00   FLUTICASONE (FLONASE) 50 MCG/ACT NASAL SPRAY    USE TWO SPRAY(S) IN EACH NOSTRIL ONCE DAILY   GABAPENTIN (NEURONTIN) 300 MG CAPSULE    Take 1 capsule (300 mg total) by mouth 2 (two) times daily.   MELOXICAM (MOBIC) 15 MG TABLET    TAKE ONE TABLET BY MOUTH ONCE DAILY   METFORMIN (GLUCOPHAGE) 1000 MG TABLET    TAKE ONE TABLET BY MOUTH TWICE DAILY WITH FOOD   ONETOUCH DELICA LANCETS 14E MISC    Use to check blood sugars 2 to 3 times a day.  Dx: 250.00   PRAVASTATIN (PRAVACHOL) 20 MG TABLET    TAKE ONE TABLET BY MOUTH ONCE DAILY  Modified Medications   Modified Medication Previous Medication   GLUCOSE BLOOD (ONE TOUCH ULTRA TEST) TEST STRIP ONE TOUCH ULTRA TEST test strip      USE TO TEST BLOOD SUGAR TWO TO THREE TIMES DAILY AS DIRECTED    USE TO TEST BLOOD SUGAR TWO TO THREE TIMES DAILY AS DIRECTED  Discontinued Medications   TRIAMCINOLONE CREAM (KENALOG) 0.1 %    Apply 1 application topically 2 (two) times daily. Max 2 weeks

## 2015-07-19 NOTE — Patient Instructions (Signed)

## 2015-07-19 NOTE — Progress Notes (Signed)
Pre visit review using our clinic review tool, if applicable. No additional management support is needed unless otherwise documented below in the visit note. 

## 2015-07-26 ENCOUNTER — Ambulatory Visit (INDEPENDENT_AMBULATORY_CARE_PROVIDER_SITE_OTHER): Payer: BLUE CROSS/BLUE SHIELD | Admitting: Primary Care

## 2015-07-26 ENCOUNTER — Encounter: Payer: Self-pay | Admitting: Primary Care

## 2015-07-26 VITALS — BP 138/84 | HR 96 | Temp 97.8°F | Ht 67.0 in | Wt 231.8 lb

## 2015-07-26 DIAGNOSIS — M62830 Muscle spasm of back: Secondary | ICD-10-CM | POA: Diagnosis not present

## 2015-07-26 MED ORDER — CYCLOBENZAPRINE HCL 5 MG PO TABS: 5.0000 mg | ORAL_TABLET | Freq: Three times a day (TID) | ORAL | Status: DC | PRN

## 2015-07-26 NOTE — Patient Instructions (Addendum)
Your symptoms appear to be related to a muscle spasm.  You will likely be sore for the next 7 days due to the impact.  Please call me if you develop dizziness, increased pain, numbness/tingling down your spine.   You may take Flexeril tablets as needed for muscle spasm. Take 1 tablet by mouth three times daily as needed. Caution as this medication may make you drowsy.  You may also continue your Meloxicam as needed for pain.  Apply ice and complete stretching.  It was a pleasure meeting you!
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
## 2015-07-26 NOTE — Progress Notes (Signed)
Pre visit review using our clinic review tool, if applicable. No additional management support is needed unless otherwise documented below in the visit note. 

## 2015-07-30 ENCOUNTER — Other Ambulatory Visit: Payer: Self-pay | Admitting: Family Medicine

## 2015-07-30 DIAGNOSIS — E119 Type 2 diabetes mellitus without complications: Secondary | ICD-10-CM

## 2015-07-30 DIAGNOSIS — R5383 Other fatigue: Secondary | ICD-10-CM

## 2015-07-30 DIAGNOSIS — E785 Hyperlipidemia, unspecified: Secondary | ICD-10-CM

## 2015-07-30 DIAGNOSIS — Z79899 Other long term (current) drug therapy: Secondary | ICD-10-CM

## 2015-08-01 ENCOUNTER — Ambulatory Visit: Payer: Self-pay | Admitting: Family Medicine

## 2015-08-08 ENCOUNTER — Other Ambulatory Visit (INDEPENDENT_AMBULATORY_CARE_PROVIDER_SITE_OTHER): Payer: BLUE CROSS/BLUE SHIELD

## 2015-08-08 DIAGNOSIS — Z79899 Other long term (current) drug therapy: Secondary | ICD-10-CM

## 2015-08-08 DIAGNOSIS — E119 Type 2 diabetes mellitus without complications: Secondary | ICD-10-CM

## 2015-08-08 DIAGNOSIS — E785 Hyperlipidemia, unspecified: Secondary | ICD-10-CM

## 2015-08-08 DIAGNOSIS — R5383 Other fatigue: Secondary | ICD-10-CM

## 2015-08-08 LAB — CBC WITH DIFFERENTIAL/PLATELET
Basophils Absolute: 0 10*3/uL (ref 0.0–0.1)
Basophils Relative: 0.2 % (ref 0.0–3.0)
Eosinophils Absolute: 0.1 10*3/uL (ref 0.0–0.7)
Eosinophils Relative: 1.2 % (ref 0.0–5.0)
HCT: 48 % (ref 39.0–52.0)
Hemoglobin: 16.8 g/dL (ref 13.0–17.0)
Lymphocytes Relative: 27.3 % (ref 12.0–46.0)
Lymphs Abs: 1.4 10*3/uL (ref 0.7–4.0)
MCHC: 35 g/dL (ref 30.0–36.0)
MCV: 88.1 fl (ref 78.0–100.0)
Monocytes Absolute: 0.5 10*3/uL (ref 0.1–1.0)
Monocytes Relative: 9.6 % (ref 3.0–12.0)
Neutro Abs: 3.2 10*3/uL (ref 1.4–7.7)
Neutrophils Relative %: 61.7 % (ref 43.0–77.0)
Platelets: 209 10*3/uL (ref 150.0–400.0)
RBC: 5.45 Mil/uL (ref 4.22–5.81)
RDW: 12.9 % (ref 11.5–15.5)
WBC: 5.2 10*3/uL (ref 4.0–10.5)

## 2015-08-08 LAB — BASIC METABOLIC PANEL
BUN: 21 mg/dL (ref 6–23)
CO2: 30 mEq/L (ref 19–32)
Calcium: 9.7 mg/dL (ref 8.4–10.5)
Chloride: 99 mEq/L (ref 96–112)
Creatinine, Ser: 0.81 mg/dL (ref 0.40–1.50)
GFR: 112.62 mL/min (ref >60.00)
Glucose, Bld: 242 mg/dL — ABNORMAL HIGH (ref 70–99)
Potassium: 4.9 mEq/L (ref 3.5–5.1)
Sodium: 136 mEq/L (ref 135–145)

## 2015-08-08 LAB — LIPID PANEL
Cholesterol: 140 mg/dL (ref 0–200)
HDL: 34.7 mg/dL — ABNORMAL LOW (ref >39.00)
LDL Cholesterol: 77 mg/dL (ref 0–99)
NonHDL: 105.57
Total CHOL/HDL Ratio: 4
Triglycerides: 141 mg/dL (ref 0.0–149.0)
VLDL: 28.2 mg/dL (ref 0.0–40.0)

## 2015-08-08 LAB — MICROALBUMIN / CREATININE URINE RATIO
Creatinine,U: 132.8 mg/dL
Microalb Creat Ratio: 4.1 mg/g (ref 0.0–30.0)
Microalb, Ur: 5.4 mg/dL — ABNORMAL HIGH (ref 0.0–1.9)

## 2015-08-08 LAB — HEMOGLOBIN A1C: Hgb A1c MFr Bld: 7.7 % — ABNORMAL HIGH (ref 4.6–6.5)

## 2015-08-08 LAB — HEPATIC FUNCTION PANEL
ALT: 52 U/L (ref 0–53)
AST: 22 U/L (ref 0–37)
Albumin: 4.5 g/dL (ref 3.5–5.2)
Alkaline Phosphatase: 52 U/L (ref 39–117)
Bilirubin, Direct: 0.1 mg/dL (ref 0.0–0.3)
Total Bilirubin: 0.8 mg/dL (ref 0.2–1.2)
Total Protein: 6.7 g/dL (ref 6.0–8.3)

## 2015-08-08 LAB — TSH: TSH: 0.73 u[IU]/mL (ref 0.35–4.50)

## 2015-08-15 ENCOUNTER — Ambulatory Visit (INDEPENDENT_AMBULATORY_CARE_PROVIDER_SITE_OTHER): Payer: BLUE CROSS/BLUE SHIELD | Admitting: Family Medicine

## 2015-08-15 ENCOUNTER — Encounter: Payer: Self-pay | Admitting: Family Medicine

## 2015-08-15 VITALS — BP 120/78 | HR 75 | Temp 98.1°F | Ht 67.5 in | Wt 231.5 lb

## 2015-08-15 DIAGNOSIS — Z0000 Encounter for general adult medical examination without abnormal findings: Secondary | ICD-10-CM | POA: Diagnosis not present

## 2015-08-15 MED ORDER — SILDENAFIL CITRATE 20 MG PO TABS: ORAL_TABLET | ORAL | Status: DC
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
## 2015-08-15 NOTE — Progress Notes (Signed)
Pre visit review using our clinic review tool, if applicable. No additional management support is needed unless otherwise documented below in the visit note. 

## 2015-08-17 ENCOUNTER — Other Ambulatory Visit: Payer: Self-pay | Admitting: Otolaryngology

## 2015-09-12 DIAGNOSIS — J324 Chronic pansinusitis: Secondary | ICD-10-CM | POA: Diagnosis not present

## 2015-10-01 DIAGNOSIS — E119 Type 2 diabetes mellitus without complications: Secondary | ICD-10-CM | POA: Diagnosis not present

## 2015-10-01 LAB — HM DIABETES EYE EXAM

## 2015-10-02 DIAGNOSIS — J324 Chronic pansinusitis: Secondary | ICD-10-CM | POA: Diagnosis not present

## 2015-10-02 DIAGNOSIS — J309 Allergic rhinitis, unspecified: Secondary | ICD-10-CM | POA: Diagnosis not present

## 2015-10-18 ENCOUNTER — Other Ambulatory Visit: Payer: Self-pay | Admitting: Family Medicine

## 2015-12-07 ENCOUNTER — Other Ambulatory Visit: Payer: Self-pay | Admitting: Family Medicine

## 2015-12-08 NOTE — Telephone Encounter (Signed)
Last office visit 08/15/2015.  Not on current medication list.  Ok to refill?

## 2015-12-11 ENCOUNTER — Other Ambulatory Visit: Payer: Self-pay | Admitting: Family Medicine

## 2015-12-11 NOTE — Telephone Encounter (Signed)
Refilled 12/10/2015.  Mr. Gialanella notified by MyChart about refill.

## 2016-01-11 ENCOUNTER — Other Ambulatory Visit: Payer: Self-pay | Admitting: Family Medicine

## 2016-01-28 ENCOUNTER — Telehealth: Payer: Self-pay | Admitting: Family Medicine

## 2016-01-28 ENCOUNTER — Ambulatory Visit (INDEPENDENT_AMBULATORY_CARE_PROVIDER_SITE_OTHER): Payer: BLUE CROSS/BLUE SHIELD | Admitting: Family Medicine

## 2016-01-28 ENCOUNTER — Encounter: Payer: Self-pay | Admitting: Family Medicine

## 2016-01-28 VITALS — BP 98/60 | HR 75 | Temp 98.3°F | Ht 67.5 in | Wt 236.5 lb

## 2016-01-28 DIAGNOSIS — E1142 Type 2 diabetes mellitus with diabetic polyneuropathy: Secondary | ICD-10-CM | POA: Diagnosis not present

## 2016-01-28 DIAGNOSIS — M545 Low back pain, unspecified: Secondary | ICD-10-CM

## 2016-01-28 DIAGNOSIS — R109 Unspecified abdominal pain: Secondary | ICD-10-CM | POA: Diagnosis not present

## 2016-01-28 LAB — POC URINALSYSI DIPSTICK (AUTOMATED)
Bilirubin, UA: NEGATIVE
Blood, UA: NEGATIVE
Ketones, UA: NEGATIVE
Leukocytes, UA: NEGATIVE
Nitrite, UA: NEGATIVE
Protein, UA: NEGATIVE
Spec Grav, UA: >=1.03
Urobilinogen, UA: 0.2
pH, UA: 6

## 2016-01-28 MED ORDER — PIOGLITAZONE HCL 30 MG PO TABS: 30.0000 mg | ORAL_TABLET | Freq: Every day | ORAL | 5 refills | Status: DC

## 2016-01-28 MED ORDER — BAYER MICROLET LANCETS MISC: 3 refills | Status: DC

## 2016-01-28 MED ORDER — GLUCOSE BLOOD VI STRP: ORAL_STRIP | 12 refills | Status: DC

## 2016-01-28 MED ORDER — BAYER CONTOUR NEXT MONITOR W/DEVICE KIT: 1.0000 | PACK | Freq: Every day | 0 refills | Status: DC

## 2016-01-28 NOTE — Telephone Encounter (Signed)
[facility] Patient Name: Lonnie Jones DOB: 03-02-1976 Initial Comment Caller states c/o elevated blood sugar and lower back pain. Nurse Assessment Nurse: Dimas Chyle, RN, Dellis Filbert Date/Time Eilene Ghazi Time): 01/28/2016 2:17:48 PM Confirm and document reason for call. If symptomatic, describe symptoms. You must click the next button to save text entered. ---Caller states c/o elevated blood sugar and lower back pain. BS is now 244. Highest last night was 315. Lower back pain. Flank pain. Has the patient traveled out of the country within the last 30 days? ---No Does the patient have any new or worsening symptoms? ---Yes Will a triage be completed? ---Yes Related visit to physician within the last 2 weeks? ---No Does the PT have any chronic conditions? (i.e. diabetes, asthma, etc.) ---Yes List chronic conditions. ---Diabetes type 2 Is this a behavioral health or substance abuse call? ---No Guidelines Guideline Title Affirmed Question Affirmed Notes Diabetes - High Blood Sugar Caller has URGENT medication or insulin pump question and triager unable to answer question Flank Pain Diabetes mellitus or weak immune system (e.g., HIV positive, cancer chemotherapy, transplant patient) (EXCEPTION: mild pain that is only present with movement) Final Disposition User See Physician within 24 Hours Dimas Chyle, RN, [employer] Referrals REFERRED TO PCP OFFICE Disagree/Comply: Comply Disagree/Comply: Comply

## 2016-01-28 NOTE — Progress Notes (Signed)
Pre visit review using our clinic review tool, if applicable. No additional management support is needed unless otherwise documented below in the visit note. 

## 2016-01-28 NOTE — Progress Notes (Signed)
Dr. Frederico Hamman T. Loriene Taunton, MD, [facility] and Sports Medicine [place], [address] Phone: [phone] Fax: [phone]  01/28/2016  Patient: Lonnie Jones, MRN: [id number], DOB: 1975-07-21, 40 y.o.  Primary Physician:  Owens Loffler, MD   Chief Complaint  Patient presents with  . Hyperglycemia  . Back Pain   Subjective:   Lonnie Jones is a 40 y.o. very pleasant male patient who presents with the following:  BS has been up, 304.  Has been up period. 180-300.  Was on glipizide with that at that point. He was getting some intermittent symptomatic low blood sugars at that point, so we discontinued this.  Recently he has not been feeling well, his sugars have been elevated, and he is had some nauseous nose, increased urination, and some dizziness.  He also has been having some low back pain.  Past Medical History, Surgical History, Social History, Family History, Problem List, Medications, and Allergies have been reviewed and updated if relevant.  Patient Active Problem List   Diagnosis Date Noted  . Peripheral autonomic neuropathy due to diabetes mellitus ([place]) 02/16/2015  . Allergic rhinitis due to allergen 08/25/2011  . Hyperlipidemia with target LDL less than 70 12/16/2010  . Type 2 diabetes mellitus with peripheral neuropathy ([place]) 08/08/2010  . MIGRAINE HEADACHE 08/08/2010  . ASTHMA 08/08/2010  . GERD 08/08/2010    Past Medical History:  Diagnosis Date  . Allergic rhinitis due to allergen 08/25/2011  . Allergy   . Asthma   . Diabetes mellitus   . GERD (gastroesophageal reflux disease)   . Hyperlipidemia LDL goal < 70 12/16/2010  . Migraine headache   . Type 2 diabetes mellitus with peripheral neuropathy ([place]) 08/08/2010   Qualifier: Diagnosis of  By: Lorelei Pont MD, Frederico Hamman      Past Surgical History:  Procedure Laterality Date  . APPENDECTOMY  2000  . CARPAL TUNNEL RELEASE    . KNEE SURGERY  2011    Social History    Social History  . Marital status: Married    Spouse name: N/A  . Number of children: 2  . 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 Smoking status: Former Smoker    Quit date: 08/19/2001  . Smokeless tobacco: Current User    Types: Snuff  . Alcohol use No  . Drug use: No  . Sexual activity: Not on file   Other Topics Concern  . Not on file   Social History Narrative  . No narrative on file    No family history on file.  Allergies  Allergen Reactions  . Codeine     Medication list reviewed and updated in full in [place].   GEN: No acute illnesses, no fevers, chills. GI: No n/v/d, eating normally Pulm: No SOB Interactive and getting along well at home.  Otherwise, ROS is as per the HPI.  Objective:   BP 98/60   Pulse 75   Temp 98.3 F (36.8 C) (Oral)   Ht 5' 7.5" (1.715 m)   Wt 236 lb 8 oz (107.3 kg)   BMI 36.49 kg/m   GEN: WDWN, NAD, Non-toxic, A & O x 3 HEENT: Atraumatic, Normocephalic. Neck supple. No masses, No LAD. Ears and Nose: No external deformity. CV: RRR, No M/G/R. No JVD. No thrill. No extra heart sounds. PULM: CTA B, no wheezes, crackles, rhonchi. No  retractions. No resp. distress. No accessory muscle use. EXTR: No c/c/e NEURO Normal gait.  PSYCH: Normally interactive. Conversant. Not depressed or anxious appearing.  Calm demeanor.   Straight leg raise negative. Tender to palpation mildly around L4-5 bilaterally.  Primarily in the paraspinous musculature.  Laboratory and Imaging Data: Results for orders placed or performed in visit on 01/28/16  POCT Urinalysis Dipstick (Automated)  Result Value Ref Range   Color, UA yellow    Clarity, UA clear    Glucose, UA 3+    Bilirubin, UA negative    Ketones, UA negative    Spec Grav, UA >=1.030    Blood, UA negative    pH, UA 6.0    Protein, UA negative    Urobilinogen, UA 0.2    Nitrite, UA negative    Leukocytes, UA  Negative Negative     Assessment and Plan:   Type 2 diabetes mellitus with peripheral neuropathy (HCC) - Plan: Hemoglobin A1c, POCT Urinalysis Dipstick (Automated)  Bilateral flank pain - Plan: POCT Urinalysis Dipstick (Automated)  Bilateral low back pain without sciatica  Out-of-control diabetes.  Start Actos 30 mg.  Recheck in 3 weeks.  More frequent blood sugar checks. Reassured about mechanical back pain.  Tylenol, NSAIDs, heat, stretching. Weight loss.  Follow-up: Return in about 3 months (around 04/29/2016).  New Prescriptions   BAYER MICROLET LANCETS LANCETS    Use to check blood sugars once daily. E11.42   BLOOD GLUCOSE MONITORING SUPPL (BAYER CONTOUR NEXT MONITOR) W/DEVICE KIT    1 each by Does not apply route daily.   GLUCOSE BLOOD (BAYER CONTOUR NEXT TEST) TEST STRIP    Use to check blood sugars once daily.  Dx: E11.42   PIOGLITAZONE (ACTOS) 30 MG TABLET    Take 1 tablet (30 mg total) by mouth daily.   Modified Medications   No medications on file   Orders Placed This Encounter  Procedures  . Hemoglobin A1c  . POCT Urinalysis Dipstick (Automated)    Signed,  Bryse Blanchette T. Careem Yasui, MD   Patient's Medications  New Prescriptions   BAYER MICROLET LANCETS LANCETS    Use to check blood sugars once daily. E11.42   BLOOD GLUCOSE MONITORING SUPPL (BAYER CONTOUR NEXT MONITOR) W/DEVICE KIT    1 each by Does not apply route daily.   GLUCOSE BLOOD (BAYER CONTOUR NEXT TEST) TEST STRIP    Use to check blood sugars once daily.  Dx: E11.42   PIOGLITAZONE (ACTOS) 30 MG TABLET    Take 1 tablet (30 mg total) by mouth daily.  Previous Medications   FLUTICASONE (FLONASE) 50 MCG/ACT NASAL SPRAY    USE TWO SPRAY(S) IN EACH NOSTRIL ONCE DAILY   GABAPENTIN (NEURONTIN) 300 MG CAPSULE    Take 1 capsule (300 mg total) by mouth 2 (two) times daily.   MELOXICAM (MOBIC) 15 MG TABLET    TAKE ONE TABLET BY MOUTH ONCE DAILY   METFORMIN (GLUCOPHAGE) 1000 MG TABLET    TAKE ONE TABLET BY MOUTH TWICE  DAILY WITH FOOD   PRAVASTATIN (PRAVACHOL) 20 MG TABLET    TAKE ONE TABLET BY MOUTH ONCE DAILY   SILDENAFIL (REVATIO) 20 MG TABLET    Generic Revatio / Sildanefil 20 mg. 2 - 5 tabs 30 mins prior to intercourse. #20, 11 refills.  Modified Medications   No medications on file  Discontinued Medications   BLOOD GLUCOSE MONITORING SUPPL (BLOOD GLUCOSE METER) KIT    Relion Prime meter. pt test three times daily.250.00   CYCLOBENZAPRINE (  FLEXERIL) 5 MG TABLET    Take 1 tablet (5 mg total) by mouth 3 (three) times daily as needed for muscle spasms. May cause drowsiness.   GLUCOSE BLOOD (ONE TOUCH ULTRA TEST) TEST STRIP    USE TO TEST BLOOD SUGAR TWO TO THREE TIMES DAILY AS DIRECTED   ONETOUCH DELICA LANCETS 60R MISC    Use to check blood sugars 2 to 3 times a day.  Dx: 250.00   TRIAMCINOLONE CREAM (KENALOG) 0.1 %    APPLY TOPICALLY TWICE DAILY FOR A MAX OF TWO WEEKS

## 2016-01-28 NOTE — Telephone Encounter (Signed)
Pt has appt with Dr Lorelei Pont on 01/28/16 at 3:45.

## 2016-01-29 LAB — HEMOGLOBIN A1C: Hgb A1c MFr Bld: 8.1 % — ABNORMAL HIGH (ref 4.6–6.5)

## 2016-02-13 ENCOUNTER — Other Ambulatory Visit: Payer: BLUE CROSS/BLUE SHIELD

## 2016-02-18 ENCOUNTER — Ambulatory Visit: Payer: BLUE CROSS/BLUE SHIELD | Admitting: Family Medicine

## 2016-02-26 ENCOUNTER — Encounter: Payer: Self-pay | Admitting: Family Medicine

## 2016-02-26 MED ORDER — GABAPENTIN 300 MG PO CAPS: 300.0000 mg | ORAL_CAPSULE | Freq: Two times a day (BID) | ORAL | 3 refills | Status: DC

## 2016-02-26 MED ORDER — PIOGLITAZONE HCL 30 MG PO TABS: 30.0000 mg | ORAL_TABLET | Freq: Every day | ORAL | 1 refills | Status: DC

## 2016-02-26 NOTE — Telephone Encounter (Signed)
Last office visit 01/28/2016.  Last refilled 02/15/2015 for #180 with 3 refills.

## 2016-04-23 ENCOUNTER — Other Ambulatory Visit: Payer: Self-pay | Admitting: Family Medicine

## 2016-04-23 DIAGNOSIS — E119 Type 2 diabetes mellitus without complications: Secondary | ICD-10-CM

## 2016-04-29 ENCOUNTER — Other Ambulatory Visit (INDEPENDENT_AMBULATORY_CARE_PROVIDER_SITE_OTHER): Payer: BLUE CROSS/BLUE SHIELD

## 2016-04-29 DIAGNOSIS — E119 Type 2 diabetes mellitus without complications: Secondary | ICD-10-CM

## 2016-04-29 LAB — HEMOGLOBIN A1C: Hgb A1c MFr Bld: 7.4 % — ABNORMAL HIGH (ref 4.6–6.5)

## 2016-04-29 LAB — BASIC METABOLIC PANEL
BUN: 20 mg/dL (ref 6–23)
CO2: 29 mEq/L (ref 19–32)
Calcium: 9.7 mg/dL (ref 8.4–10.5)
Chloride: 100 mEq/L (ref 96–112)
Creatinine, Ser: 0.74 mg/dL (ref 0.40–1.50)
GFR: 124.54 mL/min (ref >60.00)
Glucose, Bld: 258 mg/dL — ABNORMAL HIGH (ref 70–99)
Potassium: 4.7 mEq/L (ref 3.5–5.1)
Sodium: 137 mEq/L (ref 135–145)

## 2016-05-07 ENCOUNTER — Ambulatory Visit: Payer: BLUE CROSS/BLUE SHIELD | Admitting: Family Medicine

## 2016-05-12 ENCOUNTER — Ambulatory Visit (INDEPENDENT_AMBULATORY_CARE_PROVIDER_SITE_OTHER): Payer: BLUE CROSS/BLUE SHIELD | Admitting: Family Medicine

## 2016-05-12 ENCOUNTER — Encounter: Payer: Self-pay | Admitting: Family Medicine

## 2016-05-12 VITALS — BP 120/70 | HR 70 | Temp 98.4°F | Ht 67.5 in | Wt 238.2 lb

## 2016-05-12 DIAGNOSIS — J00 Acute nasopharyngitis [common cold]: Secondary | ICD-10-CM | POA: Diagnosis not present

## 2016-05-12 DIAGNOSIS — E1142 Type 2 diabetes mellitus with diabetic polyneuropathy: Secondary | ICD-10-CM

## 2016-05-12 DIAGNOSIS — Z23 Encounter for immunization: Secondary | ICD-10-CM | POA: Diagnosis not present

## 2016-05-12 NOTE — Progress Notes (Signed)
Dr. Frederico Hamman T. Jasia Hiltunen, MD, [facility] and Sports Medicine [place], [address] Phone: [phone] Fax: [phone]  05/12/2016  Patient: Lonnie Jones, MRN: [id number], DOB: 09/18/1975, 40 y.o.  Primary Physician:  Owens Loffler, MD   Chief Complaint  Patient presents with  . Diabetes  . URI   Subjective:   Lonnie Jones is a 40 y.o. very pleasant male patient who presents with the following:  F/u DM. Stopped Stoker's dip.  BS came down after doing this  Wt Readings from Last 3 Encounters:  05/12/16 238 lb 4 oz (108.1 kg)  01/28/16 236 lb 8 oz (107.3 kg)  08/15/15 231 lb 8 oz (105 kg)    Diabetes Mellitus: Tolerating Medications: yes Compliance with diet: fair Exercise: minimal / intermittent Avg blood sugars at home: 130-250 Foot problems: none Hypoglycemia: none No nausea, vomitting, blurred vision, polyuria.  Lab Results  Component Value Date   HGBA1C 7.4 (H) 04/29/2016   HGBA1C 8.1 (H) 01/28/2016   HGBA1C 7.7 (H) 08/08/2015   Lab Results  Component Value Date   MICROALBUR 5.4 (H) 08/08/2015   LDLCALC 77 08/08/2015   CREATININE 0.74 04/29/2016    Wt Readings from Last 3 Encounters:  05/12/16 238 lb 4 oz (108.1 kg)  01/28/16 236 lb 8 oz (107.3 kg)  08/15/15 231 lb 8 oz (105 kg)    Body mass index is 36.76 kg/m.   Past Medical History, Surgical History, Social History, Family History, Problem List, Medications, and Allergies have been reviewed and updated if relevant.  Patient Active Problem List   Diagnosis Date Noted  . Peripheral autonomic neuropathy due to diabetes mellitus ([place]) 02/16/2015  . Allergic rhinitis due to allergen 08/25/2011  . Hyperlipidemia with target LDL less than 70 12/16/2010  . Type 2 diabetes mellitus with peripheral neuropathy ([place]) 08/08/2010  . MIGRAINE HEADACHE 08/08/2010  . ASTHMA 08/08/2010  . GERD 08/08/2010    Past Medical History:  Diagnosis Date  . Allergic  rhinitis due to allergen 08/25/2011  . Allergy   . Asthma   . Diabetes mellitus   . GERD (gastroesophageal reflux disease)   . Hyperlipidemia LDL goal < 70 12/16/2010  . Migraine headache   . Type 2 diabetes mellitus with peripheral neuropathy ([place]) 08/08/2010   Qualifier: Diagnosis of  By: Lorelei Pont MD, Frederico Hamman      Past Surgical History:  Procedure Laterality Date  . APPENDECTOMY  2000  . CARPAL TUNNEL RELEASE    . KNEE SURGERY  2011    Social History   Social History  . Marital status: Married    Spouse name: N/A  . Number of children: 2  . 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 Smoking status: Former Smoker    Quit date: 08/19/2001  . Smokeless tobacco: Current User    Types: Snuff  . Alcohol use No  . Drug use: No  . Sexual activity: Not on file   Other Topics Concern  . Not on file   Social History Narrative  . No narrative on file    No family history on file.  Allergies  Allergen Reactions  . Codeine     Medication list reviewed and updated in full in [facility].   GEN: No acute illnesses, no fevers, chills. GI: No n/v/d, eating normally Pulm: No SOB Interactive and getting along  well at home.  Otherwise, ROS is as per the HPI.  Objective:   BP 120/70   Pulse 70   Temp 98.4 F (36.9 C) (Oral)   Ht 5' 7.5" (1.715 m)   Wt 238 lb 4 oz (108.1 kg)   BMI 36.76 kg/m   GEN: WDWN, NAD, Non-toxic, A & O x 3 HEENT: Atraumatic, Normocephalic. Neck supple. No masses, No LAD. Fluid behind TM. Ears and Nose: No external deformity. CV: RRR, No M/G/R. No JVD. No thrill. No extra heart sounds. PULM: CTA B, no wheezes, crackles, rhonchi. No retractions. No resp. distress. No accessory muscle use. EXTR: No c/c/e NEURO Normal gait.  PSYCH: Normally interactive. Conversant. Not depressed or anxious appearing.  Calm demeanor.   Laboratory and Imaging Data: Lab Results  Component Value Date    HGBA1C 7.4 (H) 04/29/2016     Assessment and Plan:   Type 2 diabetes mellitus with peripheral neuropathy ([place])  Need for prophylactic vaccination and inoculation against influenza - Plan: Flu Vaccine QUAD 36+ mos IM  Acute nasopharyngitis  Reassurance - BS doing much better  Follow-up: Return in about 3 months (around 08/10/2016).  No orders of the defined types were placed in this encounter.  There are no discontinued medications. Orders Placed This Encounter  Procedures  . Flu Vaccine QUAD 36+ mos IM  . HM DIABETES EYE EXAM    Signed,  Eudelia Hiltunen T. Hellon Vaccarella, MD     Medication List       Accurate as of 05/12/16 10:04 AM. Always use your most recent med list.          BAYER CONTOUR NEXT MONITOR w/Device Kit 1 each by Does not apply route daily.   BAYER MICROLET LANCETS lancets Use to check blood sugars once daily. E11.42   fluticasone 50 MCG/ACT nasal spray Commonly known as:  FLONASE USE TWO SPRAY(S) IN EACH NOSTRIL ONCE DAILY   gabapentin 300 MG capsule Commonly known as:  NEURONTIN Take 1 capsule (300 mg total) by mouth 2 (two) times daily.   glucose blood test strip Commonly known as:  BAYER CONTOUR NEXT TEST Use to check blood sugars once daily.  Dx: E11.42   meloxicam 15 MG tablet Commonly known as:  MOBIC TAKE ONE TABLET BY MOUTH ONCE DAILY   metFORMIN 1000 MG tablet Commonly known as:  GLUCOPHAGE TAKE ONE TABLET BY MOUTH TWICE DAILY WITH FOOD   pioglitazone 30 MG tablet Commonly known as:  ACTOS Take 1 tablet (30 mg total) by mouth daily.   pravastatin 20 MG tablet Commonly known as:  PRAVACHOL TAKE ONE TABLET BY MOUTH ONCE DAILY   sildenafil 20 MG tablet Commonly known as:  REVATIO Generic Revatio / Sildanefil 20 mg. 2 - 5 tabs 30 mins prior to intercourse. #20, 11 refills.

## 2016-05-12 NOTE — Progress Notes (Signed)
Pre visit review using our clinic review tool, if applicable. No additional management support is needed unless otherwise documented below in the visit note. 

## 2016-08-03 ENCOUNTER — Other Ambulatory Visit: Payer: Self-pay | Admitting: Family Medicine

## 2016-08-04 ENCOUNTER — Ambulatory Visit (INDEPENDENT_AMBULATORY_CARE_PROVIDER_SITE_OTHER): Payer: BLUE CROSS/BLUE SHIELD | Admitting: Family Medicine

## 2016-08-04 ENCOUNTER — Encounter: Payer: Self-pay | Admitting: Family Medicine

## 2016-08-04 VITALS — BP 120/80 | HR 68 | Temp 97.5°F | Ht 67.5 in | Wt 239.0 lb

## 2016-08-04 DIAGNOSIS — E1142 Type 2 diabetes mellitus with diabetic polyneuropathy: Secondary | ICD-10-CM

## 2016-08-04 DIAGNOSIS — M545 Low back pain, unspecified: Secondary | ICD-10-CM

## 2016-08-04 MED ORDER — PREDNISONE 20 MG PO TABS: ORAL_TABLET | ORAL | 0 refills | Status: DC

## 2016-08-04 MED ORDER — CYCLOBENZAPRINE HCL 10 MG PO TABS
10.0000 mg | ORAL_TABLET | Freq: Three times a day (TID) | ORAL | 2 refills | Status: DC | PRN
Start: 2016-08-04 — End: 2017-08-24

## 2016-08-04 NOTE — Progress Notes (Signed)
Dr. Frederico Hamman T. Delmas Faucett, MD, [facility] Primary [facility] and Sports Medicine [place], [address] Phone: [phone] Fax: [phone]  08/04/2016  Patient: Lonnie Jones, MRN: [id number], DOB: May 17, 1976, 41 y.o.  Primary Physician:  Owens Loffler, MD   Chief Complaint  Patient presents with  . Back Pain   Subjective:   Lonnie Jones is a 41 y.o. 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   He wrenched his back and now is having significant pain bilaterally in the mid back through the lower back. He is having relatively severe pain. He is artery taking Mobic 15 milligrams a day, and he took some Flexeril last night that he had left over from a prior injury.  He is not having any numbness, tingling, or other neurological changes.  Past Medical History, Surgical History, Social History, Family History, Problem List, Medications, and Allergies have been reviewed and updated if relevant.  Patient Active Problem List   Diagnosis Date Noted  . Peripheral autonomic neuropathy due to diabetes mellitus ([place]) 02/16/2015  . Allergic rhinitis due to allergen 08/25/2011  . Hyperlipidemia with target LDL less than 70 12/16/2010  . Type 2 diabetes mellitus with peripheral neuropathy ([place]) 08/08/2010  . MIGRAINE HEADACHE 08/08/2010  . ASTHMA 08/08/2010  . GERD 08/08/2010    Past Medical History:  Diagnosis Date  . Allergic rhinitis due to allergen 08/25/2011  . Allergy   . Asthma   . Diabetes mellitus   . GERD (gastroesophageal reflux disease)   . Hyperlipidemia LDL goal < 70 12/16/2010  . Migraine headache   . Type 2 diabetes mellitus with peripheral neuropathy ([place]) 08/08/2010   Qualifier: Diagnosis of  By: Lorelei Pont MD, Frederico Hamman      Past Surgical History:  Procedure Laterality Date  . APPENDECTOMY  2000  . CARPAL TUNNEL RELEASE    . KNEE SURGERY   2011    Social History   Social History  . Marital status: Married    Spouse name: N/A  . Number of children: 2  . 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 Smoking status: Former Smoker    Quit date: 08/19/2001  . Smokeless tobacco: Current User    Types: Snuff  . Alcohol use No  . Drug use: No  . Sexual activity: Not on file   Other Topics Concern  . Not on file   Social History Narrative  . No narrative on file    No family history on file.  Allergies  Allergen Reactions  . Codeine     Medication list reviewed and updated in full in [facility].  GEN: No fevers, chills. Nontoxic. Primarily MSK c/o today. MSK: Detailed in the HPI GI: tolerating PO intake without difficulty Neuro: No numbness, parasthesias, or tingling associated. Otherwise the pertinent positives of the ROS are noted above.   Objective:   BP 120/80   Pulse 68   Temp 97.5 F (36.4 C) (Oral)   Ht 5' 7.5" (1.715 m)   Wt 239 lb (108.4 kg)   BMI 36.88 kg/m    GEN: Well-developed,well-nourished,in no acute distress; alert,appropriate and cooperative throughout examination HEENT: Normocephalic and atraumatic without obvious abnormalities. Ears, externally no deformities PULM: Breathing comfortably in no respiratory distress EXT: No clubbing,  cyanosis, or edema PSYCH: Normally interactive. Cooperative during the interview. Pleasant. Friendly and conversant. Not anxious or depressed appearing. Normal, full affect.  Range of motion at  the waist: Flexion, extension, lateral bending and rotation: Modest restriction and forward flexion, and all others are normal.  No echymosis or edema Rises to examination table with mild difficulty Gait: minimally antalgic  Inspection/Deformity: N Paraspinus Tenderness: Tenderness from L1-S1 bilaterally.  B Ankle Dorsiflexion (L5,4): 5/5 B Great Toe Dorsiflexion (L5,4): 5/5 Heel Walk (L5):  WNL Toe Walk (S1): WNL Rise/Squat (L4): WNL, mild pain  SENSORY B Medial Foot (L4): WNL B Dorsum (L5): WNL B Lateral (S1): WNL Light Touch: WNL Pinprick: WNL  REFLEXES Knee (L4): 2+ Ankle (S1): 2+  B SLR, seated: neg B SLR, supine: neg B FABER: neg B Reverse FABER: neg B Greater Troch: NT B Log Roll: neg B Stork: NT B Sciatic Notch: NT   Radiology: No results found.  Assessment and Plan:   Acute bilateral low back pain without sciatica  Type 2 diabetes mellitus with peripheral neuropathy (HCC)  Acute back injury, probable muscular. Flexeril as needed. Given need for urgency to return to work as soon as possible, think it is reasonable to do a short doses some prednisone. I gave him caution given that he does have diabetes.  Follow-up: No Follow-up on file.  Meds ordered this encounter  Medications  . cyclobenzaprine (FLEXERIL) 10 MG tablet    Sig: Take 1 tablet (10 mg total) by mouth 3 (three) times daily as needed for muscle spasms.    Dispense:  30 tablet    Refill:  2  . predniSONE (DELTASONE) 20 MG tablet    Sig: 2 tabs po for 3 days, then 1 tab po for 3 days    Dispense:  9 tablet    Refill:  0   There are no discontinued medications. No orders of the defined types were placed in this encounter.   Signed,  Maud Deed. Jelisha Weed, MD   Allergies as of 08/04/2016      Reactions   Codeine       Medication List       Accurate as of 08/04/16  2:10 PM. Always use your most recent med list.          BAYER CONTOUR NEXT MONITOR w/Device Kit 1 each by Does not apply route daily.   BAYER MICROLET LANCETS lancets Use to check blood sugars once daily. E11.42   cyclobenzaprine 10 MG tablet Commonly known as:  FLEXERIL Take 1 tablet (10 mg total) by mouth 3 (three) times daily as needed for muscle spasms.   fluticasone 50 MCG/ACT nasal spray Commonly known as:  FLONASE USE TWO SPRAY(S) IN EACH NOSTRIL ONCE DAILY   gabapentin 300 MG capsule Commonly  known as:  NEURONTIN Take 1 capsule (300 mg total) by mouth 2 (two) times daily.   glucose blood test strip Commonly known as:  BAYER CONTOUR NEXT TEST Use to check blood sugars once daily.  Dx: E11.42   meloxicam 15 MG tablet Commonly known as:  MOBIC TAKE ONE TABLET BY MOUTH ONCE DAILY   metFORMIN 1000 MG tablet Commonly known as:  GLUCOPHAGE TAKE ONE TABLET BY MOUTH TWICE DAILY WITH FOOD   pioglitazone 30 MG tablet Commonly known as:  ACTOS Take 1 tablet (30 mg total) by mouth daily.   pravastatin 20 MG tablet Commonly known as:  PRAVACHOL TAKE ONE TABLET BY MOUTH ONCE DAILY   predniSONE 20 MG tablet  Commonly known as:  DELTASONE 2 tabs po for 3 days, then 1 tab po for 3 days   sildenafil 20 MG tablet Commonly known as:  REVATIO Generic Revatio / Sildanefil 20 mg. 2 - 5 tabs 30 mins prior to intercourse. #20, 11 refills.

## 2016-08-04 NOTE — Progress Notes (Signed)
Pre visit review using our clinic review tool, if applicable. No additional management support is needed unless otherwise documented below in the visit note. 

## 2016-08-11 ENCOUNTER — Other Ambulatory Visit: Payer: Self-pay | Admitting: Family Medicine

## 2016-08-15 ENCOUNTER — Other Ambulatory Visit (INDEPENDENT_AMBULATORY_CARE_PROVIDER_SITE_OTHER): Payer: BLUE CROSS/BLUE SHIELD

## 2016-08-15 DIAGNOSIS — Z125 Encounter for screening for malignant neoplasm of prostate: Secondary | ICD-10-CM

## 2016-08-15 DIAGNOSIS — E785 Hyperlipidemia, unspecified: Secondary | ICD-10-CM

## 2016-08-15 DIAGNOSIS — Z79899 Other long term (current) drug therapy: Secondary | ICD-10-CM | POA: Diagnosis not present

## 2016-08-15 DIAGNOSIS — Z114 Encounter for screening for human immunodeficiency virus [HIV]: Secondary | ICD-10-CM

## 2016-08-15 DIAGNOSIS — E1142 Type 2 diabetes mellitus with diabetic polyneuropathy: Secondary | ICD-10-CM

## 2016-08-15 DIAGNOSIS — R5383 Other fatigue: Secondary | ICD-10-CM

## 2016-08-15 LAB — BASIC METABOLIC PANEL
BUN: 24 mg/dL — ABNORMAL HIGH (ref 6–23)
CO2: 29 mEq/L (ref 19–32)
Calcium: 9.4 mg/dL (ref 8.4–10.5)
Chloride: 96 mEq/L (ref 96–112)
Creatinine, Ser: 0.72 mg/dL (ref 0.40–1.50)
GFR: 128.35 mL/min (ref >60.00)
Glucose, Bld: 241 mg/dL — ABNORMAL HIGH (ref 70–99)
Potassium: 4.2 mEq/L (ref 3.5–5.1)
Sodium: 132 mEq/L — ABNORMAL LOW (ref 135–145)

## 2016-08-15 LAB — CBC WITH DIFFERENTIAL/PLATELET
Basophils Absolute: 0 10*3/uL (ref 0.0–0.1)
Basophils Relative: 0.3 % (ref 0.0–3.0)
Eosinophils Absolute: 0.1 10*3/uL (ref 0.0–0.7)
Eosinophils Relative: 1.8 % (ref 0.0–5.0)
HCT: 48.1 % (ref 39.0–52.0)
Hemoglobin: 16.8 g/dL (ref 13.0–17.0)
Lymphocytes Relative: 31.4 % (ref 12.0–46.0)
Lymphs Abs: 1.4 10*3/uL (ref 0.7–4.0)
MCHC: 35 g/dL (ref 30.0–36.0)
MCV: 87.2 fl (ref 78.0–100.0)
Monocytes Absolute: 0.5 10*3/uL (ref 0.1–1.0)
Monocytes Relative: 10.5 % (ref 3.0–12.0)
Neutro Abs: 2.5 10*3/uL (ref 1.4–7.7)
Neutrophils Relative %: 56 % (ref 43.0–77.0)
Platelets: 185 10*3/uL (ref 150.0–400.0)
RBC: 5.51 Mil/uL (ref 4.22–5.81)
RDW: 12.7 % (ref 11.5–15.5)
WBC: 4.4 10*3/uL (ref 4.0–10.5)

## 2016-08-15 LAB — HEPATIC FUNCTION PANEL
ALT: 28 U/L (ref 0–53)
AST: 14 U/L (ref 0–37)
Albumin: 4.4 g/dL (ref 3.5–5.2)
Alkaline Phosphatase: 61 U/L (ref 39–117)
Bilirubin, Direct: 0.2 mg/dL (ref 0.0–0.3)
Total Bilirubin: 0.8 mg/dL (ref 0.2–1.2)
Total Protein: 6.4 g/dL (ref 6.0–8.3)

## 2016-08-15 LAB — LDL CHOLESTEROL, DIRECT: Direct LDL: 88 mg/dL

## 2016-08-15 LAB — LIPID PANEL
Cholesterol: 158 mg/dL (ref 0–200)
HDL: 31.6 mg/dL — ABNORMAL LOW (ref >39.00)
NonHDL: 126.27
Total CHOL/HDL Ratio: 5
Triglycerides: 236 mg/dL — ABNORMAL HIGH (ref 0.0–149.0)
VLDL: 47.2 mg/dL — ABNORMAL HIGH (ref 0.0–40.0)

## 2016-08-15 LAB — MICROALBUMIN / CREATININE URINE RATIO
Creatinine,U: 118.2 mg/dL
Microalb Creat Ratio: 4.6 mg/g (ref 0.0–30.0)
Microalb, Ur: 5.4 mg/dL — ABNORMAL HIGH (ref 0.0–1.9)

## 2016-08-15 LAB — TSH: TSH: 1.26 u[IU]/mL (ref 0.35–4.50)

## 2016-08-15 LAB — PSA: PSA: 0.94 ng/mL (ref 0.10–4.00)

## 2016-08-15 LAB — HEMOGLOBIN A1C: Hgb A1c MFr Bld: 8.3 % — ABNORMAL HIGH (ref 4.6–6.5)

## 2016-08-17 LAB — HIV ANTIBODY (ROUTINE TESTING W REFLEX): HIV 1&2 Ab, 4th Generation: NONREACTIVE

## 2016-08-20 ENCOUNTER — Ambulatory Visit (INDEPENDENT_AMBULATORY_CARE_PROVIDER_SITE_OTHER): Payer: BLUE CROSS/BLUE SHIELD | Admitting: Family Medicine

## 2016-08-20 ENCOUNTER — Encounter: Payer: Self-pay | Admitting: Family Medicine

## 2016-08-20 VITALS — BP 110/70 | HR 71 | Temp 97.7°F | Ht 71.0 in | Wt 234.8 lb

## 2016-08-20 DIAGNOSIS — Z0000 Encounter for general adult medical examination without abnormal findings: Secondary | ICD-10-CM

## 2016-08-20 MED ORDER — PIOGLITAZONE HCL 45 MG PO TABS: 45.0000 mg | ORAL_TABLET | Freq: Every day | ORAL | 3 refills | Status: DC

## 2016-08-20 MED ORDER — GLUCOSE BLOOD VI STRP: ORAL_STRIP | 3 refills | Status: DC

## 2016-08-20 MED ORDER — BAYER MICROLET LANCETS MISC: 3 refills | Status: DC

## 2016-08-20 NOTE — Progress Notes (Signed)
Dr. Frederico Jones T. Capri Raben, MD, [facility] [place], [address] Phone: [phone] Fax: [phone]  08/20/2016  Patient: Lonnie Jones, MRN: [id number], DOB: 1976/04/12, 41 y.o.  Primary Physician:  Owens Loffler, MD   Chief Complaint  Patient presents with  . Annual Exam   Subjective:   Lonnie Jones is a 41 y.o. pleasant patient who presents with the following:  Preventative Health Maintenance Visit:  Health Maintenance Summary Reviewed and updated, unless pt declines services.  Tobacco History Reviewed. Alcohol: No concerns, no excessive use Exercise Habits: Some activity, rec at least 30 mins 5 times a week STD concerns: no risk or activity to increase risk Drug Use: None Encouraged self-testicular check  Wife with SDH Back doing better  BS elevated  Health Maintenance  Topic Date Due  . OPHTHALMOLOGY EXAM  09/30/2016  . HEMOGLOBIN A1C  02/15/2017  . PNEUMOCOCCAL POLYSACCHARIDE VACCINE (2) 07/28/2017  . URINE MICROALBUMIN  08/15/2017  . FOOT EXAM  08/20/2017  . TETANUS/TDAP  07/28/2022  . INFLUENZA VACCINE  Completed  . HIV Screening  Completed   Immunization History  Administered Date(s) Administered  . Influenza, Seasonal, Injecte, Preservative Fre 07/28/2012  . Influenza,inj,Quad PF,36+ Mos 01/26/2013, 02/15/2015, 05/12/2016  . Pneumococcal Polysaccharide-23 07/28/2012  . Tdap 07/28/2012   Patient Active Problem List   Diagnosis Date Noted  . Peripheral autonomic neuropathy due to diabetes mellitus ([place]) 02/16/2015  . Allergic rhinitis due to allergen 08/25/2011  . Hyperlipidemia with target LDL less than 70 12/16/2010  . Type 2 diabetes mellitus with peripheral neuropathy ([place]) 08/08/2010  . MIGRAINE HEADACHE 08/08/2010  . ASTHMA 08/08/2010  . GERD 08/08/2010   Past Medical History:  Diagnosis Date  . Allergic rhinitis due to allergen 08/25/2011  . Allergy   . Asthma   .  Diabetes mellitus   . GERD (gastroesophageal reflux disease)   . Hyperlipidemia LDL goal < 70 12/16/2010  . Migraine headache   . Type 2 diabetes mellitus with peripheral neuropathy ([place]) 08/08/2010   Qualifier: Diagnosis of  By: Lorelei Pont MD, Frederico Jones     Past Surgical History:  Procedure Laterality Date  . APPENDECTOMY  2000  . CARPAL TUNNEL RELEASE    . KNEE SURGERY  2011   Social History   Social History  . Marital status: Married    Spouse name: N/A  . Number of children: 2  . 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 Smoking status: Former Smoker    Quit date: 08/19/2001  . Smokeless tobacco: Current User    Types: Snuff  . Alcohol use No  . Drug use: No  . Sexual activity: Not on file   Other Topics Concern  . Not on file   Social History Narrative  . No narrative on file   No family history on file. Allergies  Allergen Reactions  . Codeine     Medication list has been reviewed and updated.   General: Denies fever, chills, sweats. No significant weight loss. Eyes: Denies blurring,significant itching ENT: Denies earache, sore throat, and hoarseness. Cardiovascular: Denies chest pains, palpitations, dyspnea on exertion Respiratory: Denies cough, dyspnea at rest,wheeezing Breast: no concerns about lumps GI: Denies nausea, vomiting, diarrhea, constipation, change in bowel habits, abdominal pain, melena, hematochezia GU: Denies penile discharge, ED, urinary flow / outflow problems. No STD concerns. Musculoskeletal: Denies back pain,  joint pain Derm: Denies rash, itching Neuro: Denies  paresthesias, frequent falls, frequent headaches Psych: Denies depression, anxiety Endocrine: Denies cold intolerance, heat intolerance, polydipsia Heme: Denies enlarged lymph nodes Allergy: No hayfever  Objective:   BP 110/70   Pulse 71   Temp 97.7 F (36.5 C) (Oral)   Ht '5\' 11"'  (1.803 m)   Wt 234 lb 12 oz (106.5 kg)    BMI 32.74 kg/m  Ideal Body Weight: Weight in (lb) to have BMI = 25: 178.9  No exam data present  GEN: well developed, well nourished, no acute distress Eyes: conjunctiva and lids normal, PERRLA, EOMI ENT: TM clear, nares clear, oral exam WNL Neck: supple, no lymphadenopathy, no thyromegaly, no JVD Pulm: clear to auscultation and percussion, respiratory effort normal CV: regular rate and rhythm, S1-S2, no murmur, rub or gallop, no bruits, peripheral pulses normal and symmetric, no cyanosis, clubbing, edema or varicosities GI: soft, non-tender; no hepatosplenomegaly, masses; active bowel sounds all quadrants GU: no hernia, testicular mass, penile discharge Lymph: no cervical, axillary or inguinal adenopathy MSK: gait normal, muscle tone and strength WNL, no joint swelling, effusions, discoloration, crepitus  SKIN: clear, good turgor, color WNL, no rashes, lesions, or ulcerations Neuro: normal mental status, normal strength, sensation, and motion Psych: alert; oriented to person, place and time, normally interactive and not anxious or depressed in appearance. All labs reviewed with patient.  Lipids:    Component Value Date/Time   CHOL 158 08/15/2016 0936   TRIG 236.0 (H) 08/15/2016 0936   HDL 31.60 (L) 08/15/2016 0936   LDLDIRECT 88.0 08/15/2016 0936   VLDL 47.2 (H) 08/15/2016 0936   CHOLHDL 5 08/15/2016 0936   CBC: CBC Latest Ref Rng & Units 08/15/2016 08/08/2015 08/07/2014  WBC 4.0 - 10.5 K/uL 4.4 5.2 4.7  Hemoglobin 13.0 - 17.0 g/dL 16.8 16.8 16.9  Hematocrit 39.0 - 52.0 % 48.1 48.0 48.6  Platelets 150.0 - 400.0 K/uL 185.0 209.0 187.0    Basic Metabolic Panel:    Component Value Date/Time   NA 132 (L) 08/15/2016 0936   K 4.2 08/15/2016 0936   CL 96 08/15/2016 0936   CO2 29 08/15/2016 0936   BUN 24 (H) 08/15/2016 0936   CREATININE 0.72 08/15/2016 0936   GLUCOSE 241 (H) 08/15/2016 0936   CALCIUM 9.4 08/15/2016 0936   Hepatic Function Latest Ref Rng & Units 08/15/2016  08/08/2015 08/07/2014  Total Protein 6.0 - 8.3 g/dL 6.4 6.7 6.7  Albumin 3.5 - 5.2 g/dL 4.4 4.5 4.5  AST 0 - 37 U/L '14 22 15  ' ALT 0 - 53 U/L 28 52 36  Alk Phosphatase 39 - 117 U/L 61 52 52  Total Bilirubin 0.2 - 1.2 mg/dL 0.8 0.8 1.0  Bilirubin, Direct 0.0 - 0.3 mg/dL 0.2 0.1 -    Lab Results  Component Value Date   TSH 1.26 08/15/2016   Lab Results  Component Value Date   PSA 0.94 08/15/2016   Lab Results  Component Value Date   HGBA1C 8.3 (H) 08/15/2016     Assessment and Plan:   Healthcare maintenance   Increase actos   Health Maintenance Exam: The patient's preventative maintenance and recommended screening tests for an annual wellness exam were reviewed in full today. Brought up to date unless services declined.  Counselled on the importance of diet, exercise, and its role in overall health and mortality. The patient's FH and SH was reviewed, including their home life, tobacco status, and drug and alcohol status.  Follow-up in 1  year for physical exam or additional follow-up below.  Follow-up: Return in about 3 months (around 11/20/2016). Or follow-up in 1 year if not noted.  Meds ordered this encounter  Medications  . glucose blood (BAYER CONTOUR NEXT TEST) test strip    Sig: Use to check blood sugars once daily.  Dx: E11.42    Dispense:  100 each    Refill:  3  . BAYER MICROLET LANCETS lancets    Sig: Use to check blood sugars once daily. E11.42    Dispense:  100 each    Refill:  3  . pioglitazone (ACTOS) 45 MG tablet    Sig: Take 1 tablet (45 mg total) by mouth daily.    Dispense:  90 tablet    Refill:  3   Medications Discontinued During This Encounter  Medication Reason  . predniSONE (DELTASONE) 20 MG tablet Completed Course  . glucose blood (BAYER CONTOUR NEXT TEST) test strip Reorder  . BAYER MICROLET LANCETS lancets Reorder  . pioglitazone (ACTOS) 30 MG tablet Reorder    Signed,  Frederico Jones T. Gloristine Turrubiates, MD   Allergies as of 08/20/2016       Reactions   Codeine       Medication List       Accurate as of 08/20/16  9:49 AM. Always use your most recent med list.          BAYER CONTOUR NEXT MONITOR w/Device Kit 1 each by Does not apply route daily.   BAYER MICROLET LANCETS lancets Use to check blood sugars once daily. E11.42   cyclobenzaprine 10 MG tablet Commonly known as:  FLEXERIL Take 1 tablet (10 mg total) by mouth 3 (three) times daily as needed for muscle spasms.   fluticasone 50 MCG/ACT nasal spray Commonly known as:  FLONASE USE TWO SPRAY(S) IN EACH NOSTRIL ONCE DAILY   gabapentin 300 MG capsule Commonly known as:  NEURONTIN Take 1 capsule (300 mg total) by mouth 2 (two) times daily.   glucose blood test strip Commonly known as:  BAYER CONTOUR NEXT TEST Use to check blood sugars once daily.  Dx: E11.42   meloxicam 15 MG tablet Commonly known as:  MOBIC TAKE ONE TABLET BY MOUTH ONCE DAILY   metFORMIN 1000 MG tablet Commonly known as:  GLUCOPHAGE TAKE ONE TABLET BY MOUTH TWICE DAILY WITH FOOD   pioglitazone 45 MG tablet Commonly known as:  ACTOS Take 1 tablet (45 mg total) by mouth daily.   pravastatin 20 MG tablet Commonly known as:  PRAVACHOL TAKE ONE TABLET BY MOUTH ONCE DAILY   sildenafil 20 MG tablet Commonly known as:  REVATIO Generic Revatio / Sildanefil 20 mg. 2 - 5 tabs 30 mins prior to intercourse. #20, 11 refills.

## 2016-08-20 NOTE — Progress Notes (Signed)
Pre visit review using our clinic review tool, if applicable. No additional management support is needed unless otherwise documented below in the visit note. 

## 2016-10-07 ENCOUNTER — Other Ambulatory Visit: Payer: Self-pay | Admitting: Family Medicine

## 2016-10-13 DIAGNOSIS — E119 Type 2 diabetes mellitus without complications: Secondary | ICD-10-CM | POA: Diagnosis not present

## 2016-10-13 LAB — HM DIABETES EYE EXAM

## 2016-10-24 ENCOUNTER — Ambulatory Visit (INDEPENDENT_AMBULATORY_CARE_PROVIDER_SITE_OTHER): Payer: BLUE CROSS/BLUE SHIELD | Admitting: Primary Care

## 2016-10-24 ENCOUNTER — Encounter: Payer: Self-pay | Admitting: Primary Care

## 2016-10-24 VITALS — BP 130/80 | HR 71 | Temp 98.3°F | Ht 67.5 in | Wt 239.8 lb

## 2016-10-24 DIAGNOSIS — N529 Male erectile dysfunction, unspecified: Secondary | ICD-10-CM | POA: Diagnosis not present

## 2016-10-24 DIAGNOSIS — J309 Allergic rhinitis, unspecified: Secondary | ICD-10-CM

## 2016-10-24 MED ORDER — FLUTICASONE PROPIONATE 50 MCG/ACT NA SUSP: NASAL | 1 refills | Status: DC

## 2016-10-24 MED ORDER — SILDENAFIL CITRATE 20 MG PO TABS: ORAL_TABLET | ORAL | 0 refills | Status: DC

## 2016-10-24 NOTE — Patient Instructions (Signed)
I sent refills of Flonase to [facility] and sildenafil to [facility].  Apply warm compresses to the site 2-3 times daily. Monitor the site and notify me if it gets larger, more painful, you develop fevers, the site gets really firm.  It was a pleasure meeting you!

## 2016-10-24 NOTE — Progress Notes (Signed)
Subjective:    Patient ID: Lonnie Jones, male    DOB: [birth year], 41 y.o.   MRN: [id number]  HPI  Lonnie Jones is a 41 year old male who presents today with a chief complaint of insect bite. He was in his work [employer], noticed a sudden painful sensation, and thinks he was bitten by a spider or other insect. Since then he's experienced irritation and redness. He denies fevers, itching, body aches. He's not tried anything OTC. He did not see anything bite him. The redness to his site is no larger, slightly improved.   He would also like refills for Flonase and Sildenafil. He uses the Sildenafil 2-3 times monthly for erectile dysfunction.   Review of Systems  Constitutional: Negative for fatigue and fever.  Musculoskeletal: Negative for myalgias.  Skin: Positive for color change and wound.       Past Medical History:  Diagnosis Date  . Allergic rhinitis due to allergen 08/25/2011  . Allergy   . Asthma   . Diabetes mellitus   . GERD (gastroesophageal reflux disease)   . Hyperlipidemia LDL goal < 70 12/16/2010  . Migraine headache   . Type 2 diabetes mellitus with peripheral neuropathy ([place]) 08/08/2010   Qualifier: Diagnosis of  By: Lorelei Pont MD, Frederico Hamman       Social History   Social History  . Marital status: Married    Spouse name: N/A  . Number of children: 2  . 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 Smoking status: Former Smoker    Quit date: 08/19/2001  . Smokeless tobacco: Current User    Types: Snuff  . Alcohol use No  . Drug use: No  . Sexual activity: Not on file   Other Topics Concern  . Not on file   Social History Narrative  . No narrative on file    Past Surgical History:  Procedure Laterality Date  . APPENDECTOMY  2000  . CARPAL TUNNEL RELEASE    . KNEE SURGERY  2011    No family history on file.  Allergies  Allergen Reactions  . Codeine     Current Outpatient Prescriptions on File Prior to  Visit  Medication Sig Dispense Refill  . BAYER MICROLET LANCETS lancets Use to check blood sugars once daily. E11.42 100 each 3  . Blood Glucose Monitoring Suppl (BAYER CONTOUR NEXT MONITOR) w/Device KIT 1 each by Does not apply route daily. 1 kit 0  . cyclobenzaprine (FLEXERIL) 10 MG tablet Take 1 tablet (10 mg total) by mouth 3 (three) times daily as needed for muscle spasms. 30 tablet 2  . gabapentin (NEURONTIN) 300 MG capsule Take 1 capsule (300 mg total) by mouth 2 (two) times daily. 180 capsule 3  . glucose blood (BAYER CONTOUR NEXT TEST) test strip Use to check blood sugars once daily.  Dx: E11.42 100 each 3  . meloxicam (MOBIC) 15 MG tablet TAKE ONE TABLET BY MOUTH ONCE DAILY 90 tablet 3  . metFORMIN (GLUCOPHAGE) 1000 MG tablet TAKE ONE TABLET BY MOUTH TWICE DAILY WITH FOOD 180 tablet 1  . pioglitazone (ACTOS) 45 MG tablet Take 1 tablet (45 mg total) by mouth daily. 90 tablet 3  . pravastatin (PRAVACHOL) 20 MG tablet TAKE ONE TABLET BY MOUTH ONCE DAILY 90 tablet 3   No current facility-administered medications on file prior to visit.     BP 130/80   Pulse  71   Temp 98.3 F (36.8 C) (Oral)   Ht 5' 7.5" (1.715 m)   Wt 239 lb 12.8 oz (108.8 kg)   SpO2 93%   BMI 37.00 kg/m    Objective:   Physical Exam  Constitutional: He appears well-nourished.  Cardiovascular: Normal rate and regular rhythm.   Pulmonary/Chest: Effort normal and breath sounds normal.  Skin: Skin is warm and dry.  1.5 circular region of erythema to right posterior thigh, mild. Site is soft, non tender, no drainage.          Assessment & Plan:  Insect Bite:  Located to right posterior thigh. Site does not appear infected. Non tender. No surrounding cellulitis. Will have him apply warm compresses and monitor site.  Sheral Flow, NP

## 2016-11-25 ENCOUNTER — Telehealth: Payer: Self-pay | Admitting: Family Medicine

## 2016-11-25 NOTE — Telephone Encounter (Signed)
Noted, order request sent to Dr Lorelei Pont

## 2016-11-25 NOTE — Telephone Encounter (Signed)
Pt has lab appt tomorrow for a1c.  WIll youplease put lab order in? Pt will have follow up on 07/05  Thank you

## 2016-11-26 ENCOUNTER — Other Ambulatory Visit (INDEPENDENT_AMBULATORY_CARE_PROVIDER_SITE_OTHER): Payer: BLUE CROSS/BLUE SHIELD

## 2016-11-26 ENCOUNTER — Ambulatory Visit: Payer: BLUE CROSS/BLUE SHIELD | Admitting: Family Medicine

## 2016-11-26 DIAGNOSIS — E119 Type 2 diabetes mellitus without complications: Secondary | ICD-10-CM

## 2016-11-26 LAB — HEMOGLOBIN A1C: Hgb A1c MFr Bld: 8 % — ABNORMAL HIGH (ref 4.6–6.5)

## 2016-12-04 ENCOUNTER — Ambulatory Visit (INDEPENDENT_AMBULATORY_CARE_PROVIDER_SITE_OTHER): Payer: BLUE CROSS/BLUE SHIELD | Admitting: Family Medicine

## 2016-12-04 ENCOUNTER — Encounter: Payer: Self-pay | Admitting: Family Medicine

## 2016-12-04 VITALS — BP 100/60 | HR 79 | Temp 98.2°F | Ht 71.0 in | Wt 239.8 lb

## 2016-12-04 DIAGNOSIS — E1142 Type 2 diabetes mellitus with diabetic polyneuropathy: Secondary | ICD-10-CM

## 2016-12-04 NOTE — Progress Notes (Signed)
Dr. Frederico Hamman T. Vernisha Bacote, MD, [facility] [place], [address] Phone: [phone] Fax: [phone]  12/04/2016  Patient: Lonnie Jones, MRN: [id number], DOB: May 01, 1976, 41 y.o.  Primary Physician:  Owens Loffler, MD   Chief Complaint  Patient presents with  . Follow-up    3 month   Subjective:   Lonnie Jones is a 41 y.o. very pleasant male patient who presents with the following:  Diabetes Mellitus: Tolerating Medications: yes Compliance with diet: fair Exercise: minimal / intermittent Avg blood sugars at home: not checking Foot problems: none Hypoglycemia: none No nausea, vomitting, blurred vision, polyuria.  Lab Results  Component Value Date   HGBA1C 8.0 (H) 11/26/2016   HGBA1C 8.3 (H) 08/15/2016   HGBA1C 7.4 (H) 04/29/2016   Lab Results  Component Value Date   MICROALBUR 5.4 (H) 08/15/2016   LDLCALC 77 08/08/2015   CREATININE 0.72 08/15/2016    Wt Readings from Last 3 Encounters:  12/04/16 239 lb 12 oz (108.7 kg)  10/24/16 239 lb 12.8 oz (108.8 kg)  08/20/16 234 lb 12 oz (106.5 kg)    Body mass index is 33.44 kg/m.   Past Medical History, Surgical History, Social History, Family History, Problem List, Medications, and Allergies have been reviewed and updated if relevant.  Patient Active Problem List   Diagnosis Date Noted  . Peripheral autonomic neuropathy due to diabetes mellitus ([place]) 02/16/2015  . Allergic rhinitis due to allergen 08/25/2011  . Hyperlipidemia with target LDL less than 70 12/16/2010  . Type 2 diabetes mellitus with peripheral neuropathy ([place]) 08/08/2010  . MIGRAINE HEADACHE 08/08/2010  . ASTHMA 08/08/2010  . GERD 08/08/2010    Past Medical History:  Diagnosis Date  . Allergic rhinitis due to allergen 08/25/2011  . Allergy   . Asthma   . Diabetes mellitus   . GERD (gastroesophageal reflux disease)   . Hyperlipidemia LDL goal < 70 12/16/2010  . Migraine headache    . Type 2 diabetes mellitus with peripheral neuropathy ([place]) 08/08/2010   Qualifier: Diagnosis of  By: Lorelei Pont MD, Frederico Hamman      Past Surgical History:  Procedure Laterality Date  . APPENDECTOMY  2000  . CARPAL TUNNEL RELEASE    . KNEE SURGERY  2011    Social History   Social History  . Marital status: Married    Spouse name: N/A  . Number of children: 2  . 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 Smoking status: Former Smoker    Quit date: 08/19/2001  . Smokeless tobacco: Current User    Types: Snuff  . Alcohol use No  . Drug use: No  . Sexual activity: Not on file   Other Topics Concern  . Not on file   Social History Narrative  . No narrative on file    No family history on file.  Allergies  Allergen Reactions  . Codeine     Medication list reviewed and updated in full in [place].   GEN: No acute illnesses, no fevers, chills. GI: No n/v/d, eating normally Pulm: No SOB Interactive and getting along well at home.  Otherwise, ROS is as per the HPI.  Objective:   BP 100/60   Pulse 79   Temp 98.2 F (36.8 C) (Oral)   Ht '5\' 11"'  (1.803 m)   Wt 239 lb 12 oz (  108.7 kg)   BMI 33.44 kg/m   GEN: WDWN, NAD, Non-toxic, A & O x 3 HEENT: Atraumatic, Normocephalic. Neck supple. No masses, No LAD. Ears and Nose: No external deformity. CV: RRR, No M/G/R. No JVD. No thrill. No extra heart sounds. PULM: CTA B, no wheezes, crackles, rhonchi. No retractions. No resp. distress. No accessory muscle use. EXTR: No c/c/e NEURO Normal gait.  PSYCH: Normally interactive. Conversant. Not depressed or anxious appearing.  Calm demeanor.   Laboratory and Imaging Data:  Assessment and Plan:   Type 2 diabetes mellitus with peripheral neuropathy (HCC)  A1c is still 8 despite increasing his Actos. He is demented have a very concerted effort to lose weight and then: Weight of 20 pounds down by the next time I see  Lonnie Jones. If this does not lower his blood sugars, and we will likely need to add more additional agents.  Follow-up: Return for 6 months for CPX with Dr. Loletha Grayer  Future Appointments Date Time Provider [place]  08/19/2017 8:00 AM [facility]  08/24/2017 8:30 AM Virlee Stroschein, Frederico Hamman, MD [facility]    Signed,  Maud Deed. Lillias Difrancesco, MD   Allergies as of 12/04/2016      Reactions   Codeine       Medication List       Accurate as of 12/04/16 10:17 AM. Always use your most recent med list.          BAYER CONTOUR NEXT MONITOR w/Device Kit 1 each by Does not apply route daily.   BAYER MICROLET LANCETS lancets Use to check blood sugars once daily. E11.42   cyclobenzaprine 10 MG tablet Commonly known as:  FLEXERIL Take 1 tablet (10 mg total) by mouth 3 (three) times daily as needed for muscle spasms.   fluticasone 50 MCG/ACT nasal spray Commonly known as:  FLONASE USE TWO SPRAY(S) IN EACH NOSTRIL ONCE DAILY   gabapentin 300 MG capsule Commonly known as:  NEURONTIN Take 1 capsule (300 mg total) by mouth 2 (two) times daily.   glucose blood test strip Commonly known as:  BAYER CONTOUR NEXT TEST Use to check blood sugars once daily.  Dx: E11.42   meloxicam 15 MG tablet Commonly known as:  MOBIC TAKE ONE TABLET BY MOUTH ONCE DAILY   metFORMIN 1000 MG tablet Commonly known as:  GLUCOPHAGE TAKE ONE TABLET BY MOUTH TWICE DAILY WITH FOOD   pioglitazone 45 MG tablet Commonly known as:  ACTOS Take 1 tablet (45 mg total) by mouth daily.   pravastatin 20 MG tablet Commonly known as:  PRAVACHOL TAKE ONE TABLET BY MOUTH ONCE DAILY   sildenafil 20 MG tablet Commonly known as:  REVATIO Generic Revatio / Sildanefil 20 mg. 2 - 5 tabs 30 mins prior to intercourse.

## 2016-12-11 ENCOUNTER — Encounter: Payer: Self-pay | Admitting: Family Medicine

## 2016-12-11 ENCOUNTER — Ambulatory Visit (INDEPENDENT_AMBULATORY_CARE_PROVIDER_SITE_OTHER): Payer: BLUE CROSS/BLUE SHIELD | Admitting: Family Medicine

## 2016-12-11 VITALS — BP 106/64 | HR 66 | Temp 97.8°F | Ht 71.0 in | Wt 238.0 lb

## 2016-12-11 DIAGNOSIS — L03113 Cellulitis of right upper limb: Secondary | ICD-10-CM

## 2016-12-11 MED ORDER — CEPHALEXIN 500 MG PO CAPS: 500.0000 mg | ORAL_CAPSULE | Freq: Three times a day (TID) | ORAL | 0 refills | Status: DC

## 2016-12-11 NOTE — Patient Instructions (Signed)
This is either local reaction to insect sting or possible skin infection (cellulitis) Treat with cool compresses initially ,as well as keflex antibiotic.  Let us know if area spreading despite treatment or if any systemic symptoms like fever >101, nausea, joint pains.

## 2016-12-11 NOTE — Progress Notes (Signed)
BP 106/64   Pulse 66   Temp 97.8 F (36.6 C)   Ht 5' 11"  (1.803 m)   Wt 238 lb (108 kg)   BMI 33.19 kg/m    CC: ?insect or arthropod bite Subjective:    Patient ID: Lonnie Jones, male    DOB: 1975-12-06, 41 y.o.   MRN: [id number]  HPI: Lonnie Jones is a 41 y.o. male presenting on 12/11/2016 for Acute Visit (?? spider bite or yellow jacket)   2d ago brushed up against bush - something stung him immediately. Over last 2 days noticing spreading redness and warmth.   No fevers, nausea, tongue or throat swelling or dyspnea or dysphagia.   Has been treating with benadryl PO and topical gold bond insect bite cream.   Diabetic. Lab Results  Component Value Date   HGBA1C 8.0 (H) 11/26/2016     Relevant past medical, surgical, family and social history reviewed and updated as indicated. Interim medical history since our last visit reviewed. Allergies and medications reviewed and updated. Outpatient Medications Prior to Visit  Medication Sig Dispense Refill  . BAYER MICROLET LANCETS lancets Use to check blood sugars once daily. E11.42 100 each 3  . Blood Glucose Monitoring Suppl (BAYER CONTOUR NEXT MONITOR) w/Device KIT 1 each by Does not apply route daily. 1 kit 0  . cyclobenzaprine (FLEXERIL) 10 MG tablet Take 1 tablet (10 mg total) by mouth 3 (three) times daily as needed for muscle spasms. 30 tablet 2  . fluticasone (FLONASE) 50 MCG/ACT nasal spray USE TWO SPRAY(S) IN EACH NOSTRIL ONCE DAILY 16 g 1  . gabapentin (NEURONTIN) 300 MG capsule Take 1 capsule (300 mg total) by mouth 2 (two) times daily. 180 capsule 3  . glucose blood (BAYER CONTOUR NEXT TEST) test strip Use to check blood sugars once daily.  Dx: E11.42 100 each 3  . meloxicam (MOBIC) 15 MG tablet TAKE ONE TABLET BY MOUTH ONCE DAILY 90 tablet 3  . metFORMIN (GLUCOPHAGE) 1000 MG tablet TAKE ONE TABLET BY MOUTH TWICE DAILY WITH FOOD 180 tablet 1  . pioglitazone (ACTOS) 45 MG tablet Take 1 tablet (45 mg total) by mouth  daily. 90 tablet 3  . pravastatin (PRAVACHOL) 20 MG tablet TAKE ONE TABLET BY MOUTH ONCE DAILY 90 tablet 3  . sildenafil (REVATIO) 20 MG tablet Generic Revatio / Sildanefil 20 mg. 2 - 5 tabs 30 mins prior to intercourse. 20 tablet 0   No facility-administered medications prior to visit.      Per HPI unless specifically indicated in ROS section below Review of Systems     Objective:    BP 106/64   Pulse 66   Temp 97.8 F (36.6 C)   Ht 5' 11"  (1.803 m)   Wt 238 lb (108 kg)   BMI 33.19 kg/m   Wt Readings from Last 3 Encounters:  12/11/16 238 lb (108 kg)  12/04/16 239 lb 12 oz (108.7 kg)  10/24/16 239 lb 12.8 oz (108.8 kg)    Physical Exam  Constitutional: He appears well-developed and well-nourished. No distress.  Lymphadenopathy:    He has no axillary adenopathy.       Right axillary: No lateral adenopathy present.  Skin: Skin is warm and dry. There is erythema.     R medial upper arm with slight erythematous macule at site of sting without retained insect part, with surrounding erythema and warmth and tenderness, no fluctuance or induration.  Erythema delineated today  Nursing note and vitals  reviewed.     Assessment & Plan:   Problem List Items Addressed This Visit    Cellulitis of right upper arm - Primary    Local reaction to insect/bug sting vs cellulitis after sting. Treat with keflex antibiotic x 1 wk TID. Discussed cool compresses and red flags to monitor for. Avoid doxy due to extensive sun exposure at work.           Follow up plan: Return if symptoms worsen or fail to improve.  Ria Bush, MD

## 2016-12-11 NOTE — Assessment & Plan Note (Signed)
Local reaction to insect/bug sting vs cellulitis after sting. Treat with keflex antibiotic x 1 wk TID. Discussed cool compresses and red flags to monitor for. Avoid doxy due to extensive sun exposure at work.

## 2017-01-08 ENCOUNTER — Other Ambulatory Visit: Payer: Self-pay | Admitting: Family Medicine

## 2017-01-08 NOTE — Telephone Encounter (Signed)
Last office visit 12/11/2016 with Dr. Darnell Level.  Last refilled 01/13/2016 for #90 with 3 refills.  Ok to refill?

## 2017-02-05 ENCOUNTER — Other Ambulatory Visit: Payer: Self-pay | Admitting: Family Medicine

## 2017-03-12 ENCOUNTER — Other Ambulatory Visit: Payer: Self-pay | Admitting: Family Medicine

## 2017-03-12 NOTE — Telephone Encounter (Signed)
Last office visit 12/11/2016 with Dr. Darnell Level for cellulitis.  Last refilled 02/26/2016 for #180 with 3 refills.  Ok to refill?

## 2017-04-07 DIAGNOSIS — M25562 Pain in left knee: Secondary | ICD-10-CM | POA: Diagnosis not present

## 2017-04-13 DIAGNOSIS — M25562 Pain in left knee: Secondary | ICD-10-CM | POA: Diagnosis not present

## 2017-04-16 DIAGNOSIS — M25562 Pain in left knee: Secondary | ICD-10-CM | POA: Diagnosis not present

## 2017-04-29 DIAGNOSIS — M2242 Chondromalacia patellae, left knee: Secondary | ICD-10-CM | POA: Diagnosis not present

## 2017-04-29 DIAGNOSIS — Y999 Unspecified external cause status: Secondary | ICD-10-CM | POA: Diagnosis not present

## 2017-04-29 DIAGNOSIS — M6752 Plica syndrome, left knee: Secondary | ICD-10-CM | POA: Diagnosis not present

## 2017-04-29 DIAGNOSIS — S83242A Other tear of medial meniscus, current injury, left knee, initial encounter: Secondary | ICD-10-CM | POA: Diagnosis not present

## 2017-04-29 DIAGNOSIS — G8918 Other acute postprocedural pain: Secondary | ICD-10-CM | POA: Diagnosis not present

## 2017-04-29 DIAGNOSIS — X58XXXA Exposure to other specified factors, initial encounter: Secondary | ICD-10-CM | POA: Diagnosis not present

## 2017-05-04 DIAGNOSIS — S83242D Other tear of medial meniscus, current injury, left knee, subsequent encounter: Secondary | ICD-10-CM | POA: Diagnosis not present

## 2017-05-04 DIAGNOSIS — M25562 Pain in left knee: Secondary | ICD-10-CM | POA: Diagnosis not present

## 2017-05-04 DIAGNOSIS — R262 Difficulty in walking, not elsewhere classified: Secondary | ICD-10-CM | POA: Diagnosis not present

## 2017-05-04 DIAGNOSIS — M6281 Muscle weakness (generalized): Secondary | ICD-10-CM | POA: Diagnosis not present

## 2017-05-07 DIAGNOSIS — S83242D Other tear of medial meniscus, current injury, left knee, subsequent encounter: Secondary | ICD-10-CM | POA: Diagnosis not present

## 2017-05-28 ENCOUNTER — Telehealth: Payer: Self-pay | Admitting: Family Medicine

## 2017-05-28 MED ORDER — OSELTAMIVIR PHOSPHATE 75 MG PO CAPS: 75.0000 mg | ORAL_CAPSULE | Freq: Every day | ORAL | 0 refills | Status: DC

## 2017-05-28 NOTE — Telephone Encounter (Signed)
Copied from [place] [phone]. Topic: Quick Communication - See Telephone Encounter >> May 28, 2017  3:02 PM Bea Graff, NT wrote: CRM for notification. See Telephone encounter for: Pt would like to see if something can be called in for a sinus infection? Uses [facility] on [address]   05/28/17.

## 2017-05-28 NOTE — Telephone Encounter (Signed)
If confirmed flu, that is reasonable.  Tamiflu 75 mg, 1 po bid, #10, 0 refills

## 2017-05-28 NOTE — Telephone Encounter (Signed)
Spoke with Quillian Quince.  He confirms that his nephew did have a positive flu test.  Tamiflu sent into [facility] as instructed by Dr. Lorelei Pont.

## 2017-05-28 NOTE — Telephone Encounter (Signed)
Copied from [place]. Topic: Inquiry >> May 28, 2017  4:13 PM Moton, Claiborne [place], [place] wrote: Reason for CRM: Patient calling because he wanted to let someone know that his nephew has the flu and he was in contact with him. If someone could give him a call back at [phone]. Patient stated that he is sick as well.

## 2017-05-28 NOTE — Telephone Encounter (Signed)
Pt calling back to speak with nurse

## 2017-05-28 NOTE — Telephone Encounter (Signed)
Unable to reach pt by phone to get more info on present symptoms. Pt last seen 12/11/16.

## 2017-05-28 NOTE — Telephone Encounter (Signed)
Pt having prod cough with green - yellow phlegm, Throat feels like on fire. Pt has not taken temp but feels hot to the touch. No wheezing or SOB. Some head congestion. Both ears are popping and hurting.ps nephew has been sick also. There are no appts at [facility] but pt wants note sent to Dr Lorelei Pont to see if med could be sent to [address].

## 2017-05-29 ENCOUNTER — Telehealth: Payer: Self-pay | Admitting: Family Medicine

## 2017-05-29 MED ORDER — OSELTAMIVIR PHOSPHATE 75 MG PO CAPS: 75.0000 mg | ORAL_CAPSULE | Freq: Two times a day (BID) | ORAL | 0 refills | Status: DC

## 2017-05-29 NOTE — Telephone Encounter (Signed)
Pt   Received   A   rx   For Tamiflu   Yesterday    Which   Was  Sent  In  By  Dr  Lorelei Pont  Pt  Was  Told  By  The  Nurse   At the office  To  Take  The  Pills  Twice     A  Day  -  Dr    Lorelei Pont  Note  Says  Twice  A  Day  But the  Rx   States  Once  Daily .   [place]   [place]    PTS  PHONE  NUMBER  IS  [phone]

## 2017-05-29 NOTE — Telephone Encounter (Signed)
Spoke with Lonnie Jones and advised him to take the Tamiflu one capsule twice a day for 5 days. Instructions updated on med list.

## 2017-05-29 NOTE — Telephone Encounter (Signed)
Tamiflu 75 mg, 1 po bid, #10, 0 refills

## 2017-05-29 NOTE — Telephone Encounter (Signed)
Please verify instruction

## 2017-06-04 DIAGNOSIS — S83242D Other tear of medial meniscus, current injury, left knee, subsequent encounter: Secondary | ICD-10-CM | POA: Diagnosis not present

## 2017-06-22 ENCOUNTER — Encounter: Payer: Self-pay | Admitting: Family Medicine

## 2017-06-22 ENCOUNTER — Ambulatory Visit: Payer: BLUE CROSS/BLUE SHIELD | Admitting: Family Medicine

## 2017-06-22 ENCOUNTER — Other Ambulatory Visit: Payer: Self-pay

## 2017-06-22 VITALS — BP 140/70 | HR 112 | Temp 98.0°F | Ht 71.0 in | Wt 245.0 lb

## 2017-06-22 DIAGNOSIS — M10071 Idiopathic gout, right ankle and foot: Secondary | ICD-10-CM

## 2017-06-22 DIAGNOSIS — J309 Allergic rhinitis, unspecified: Secondary | ICD-10-CM

## 2017-06-22 DIAGNOSIS — H6983 Other specified disorders of Eustachian tube, bilateral: Secondary | ICD-10-CM

## 2017-06-22 DIAGNOSIS — M10079 Idiopathic gout, unspecified ankle and foot: Secondary | ICD-10-CM | POA: Insufficient documentation

## 2017-06-22 DIAGNOSIS — J0141 Acute recurrent pansinusitis: Secondary | ICD-10-CM

## 2017-06-22 HISTORY — DX: Idiopathic gout, unspecified ankle and foot: M10.079

## 2017-06-22 MED ORDER — PREDNISONE 20 MG PO TABS: ORAL_TABLET | ORAL | 0 refills | Status: DC

## 2017-06-22 MED ORDER — AMOXICILLIN 500 MG PO CAPS: 1000.0000 mg | ORAL_CAPSULE | Freq: Two times a day (BID) | ORAL | 0 refills | Status: AC

## 2017-06-22 MED ORDER — FLUTICASONE PROPIONATE 50 MCG/ACT NA SUSP: NASAL | 3 refills | Status: DC

## 2017-06-22 NOTE — Progress Notes (Signed)
Dr. Frederico Hamman T. Davidjames Blansett, MD, [facility] [place], [address] Phone: [phone] Fax: [phone]  06/22/2017  Patient: Lonnie Jones, MRN: [id number], DOB: [birth year], 42 y.o.  Primary Physician:  Owens Loffler, MD   Chief Complaint  Patient presents with  . Ears Stopped Up  . Sinus Drainage  . Foot Pain    Right ?Gout   Subjective:   Lonnie Jones is a 42 y.o. very pleasant male patient who presents with the following:  Post-flu, the patient has not been feeling well for 1 month and is s/p tamiflu treatment for flu. He also has congested and painful sinuses and ears. Decreased ears and fullness.  Pain and swelling in his MTP joints.   Past Medical History, Surgical History, Social History, Family History, Problem List, Medications, and Allergies have been reviewed and updated if relevant.  Patient Active Problem List   Diagnosis Date Noted  . Cellulitis of right upper arm 12/11/2016  . Peripheral autonomic neuropathy due to diabetes mellitus ([place]) 02/16/2015  . Allergic rhinitis due to allergen 08/25/2011  . Hyperlipidemia with target LDL less than 70 12/16/2010  . Type 2 diabetes mellitus with peripheral neuropathy ([place]) 08/08/2010  . MIGRAINE HEADACHE 08/08/2010  . ASTHMA 08/08/2010  . GERD 08/08/2010    Past Medical History:  Diagnosis Date  . Allergic rhinitis due to allergen 08/25/2011  . Allergy   . Asthma   . Diabetes mellitus   . GERD (gastroesophageal reflux disease)   . Hyperlipidemia LDL goal < 70 12/16/2010  . Migraine headache   . Type 2 diabetes mellitus with peripheral neuropathy ([place]) 08/08/2010   Qualifier: Diagnosis of  By: Lorelei Pont MD, Frederico Hamman      Past Surgical History:  Procedure Laterality Date  . APPENDECTOMY  2000  . CARPAL TUNNEL RELEASE    . KNEE SURGERY  2011    Social History   Socioeconomic History  . Marital status: Married    Spouse name: Not on file  .  Number of children: 2  . Years of education: Not on file  . Highest education level: Not on file  Social Needs  . Financial resource strain: Not on file  . Food insecurity - worry: Not on file  . Food insecurity - inability: Not on file  . Transportation needs - medical: Not on file  . Transportation needs - non-medical: Not on file  Occupational History    Comment: Sewer  Tobacco Use  . Smoking status: Former Smoker    Last attempt to quit: 08/19/2001    Years since quitting: 15.8  . Smokeless tobacco: Current User    Types: Snuff  Substance and Sexual Activity  . Alcohol use: No    Alcohol/week: 0.0 oz  . Drug use: No  . Sexual activity: Not on file  Other Topics Concern  . Not on file  Social History Narrative  . Not on file    History reviewed. No pertinent family history.  Allergies  Allergen Reactions  . Codeine     Medication list reviewed and updated in full in [place].  ROS: GEN: Acute illness details above GI: Tolerating PO intake GU: maintaining adequate hydration and urination Pulm: No SOB Interactive and getting along well at home.  Otherwise, ROS is as per the HPI.  Objective:   BP 140/70   Pulse (!) 112   Temp 98 F (36.7 C) (Oral)   Ht 5' 11"  (  1.803 m)   Wt 245 lb (111.1 kg)   BMI 34.17 kg/m    Gen: WDWN, NAD; alert,appropriate and cooperative throughout exam    HEENT: Normocephalic and atraumatic. Throat clear, w/o exudate, no LAD, R TM clear, L TM - good landmarks, No fluid present. rhinnorhea.  Left frontal and maxillary sinuses: Tender Right frontal and maxillary sinuses: Tender    Neck: No ant or post LAD  CV: RRR, No M/G/R  Pulm: Breathing comfortably in no resp distress. no w/c/r  Abd: S,NT,ND,+BS Extr: no c/c/e Psych: full affect, pleasant   R MTP 2-5 swollen and ttp   Laboratory and Imaging Data:  Assessment and Plan:   Acute recurrent pansinusitis  Allergic rhinitis, unspecified seasonality, unspecified  trigger - Plan: fluticasone (FLONASE) 50 MCG/ACT nasal spray  ETD (Eustachian tube dysfunction), bilateral  Acute idiopathic gout involving toe of right foot  RX for all, steroids including gout, rx amox  Follow-up: No Follow-up on file.  Meds ordered this encounter  Medications  . fluticasone (FLONASE) 50 MCG/ACT nasal spray    Sig: USE TWO SPRAY(S) IN EACH NOSTRIL ONCE DAILY    Dispense:  48 g    Refill:  3  . amoxicillin (AMOXIL) 500 MG capsule    Sig: Take 2 capsules (1,000 mg total) by mouth 2 (two) times daily for 10 days.    Dispense:  40 capsule    Refill:  0  . predniSONE (DELTASONE) 20 MG tablet    Sig: 2 tabs po daily for 5 days, then 1 tab po daily for 5 days    Dispense:  15 tablet    Refill:  0   Medications Discontinued During This Encounter  Medication Reason  . cephALEXin (KEFLEX) 500 MG capsule Completed Course  . oseltamivir (TAMIFLU) 75 MG capsule Completed Course  . fluticasone (FLONASE) 50 MCG/ACT nasal spray Reorder   No orders of the defined types were placed in this encounter.   Signed,  Maud Deed. Timiko Offutt, MD   Allergies as of 06/22/2017      Reactions   Codeine       Medication List        Accurate as of 06/22/17  2:35 PM. Always use your most recent med list.          amoxicillin 500 MG capsule Commonly known as:  AMOXIL Take 2 capsules (1,000 mg total) by mouth 2 (two) times daily for 10 days.   BAYER CONTOUR NEXT MONITOR w/Device Kit 1 each by Does not apply route daily.   BAYER MICROLET LANCETS lancets Use to check blood sugars once daily. E11.42   cyclobenzaprine 10 MG tablet Commonly known as:  FLEXERIL Take 1 tablet (10 mg total) by mouth 3 (three) times daily as needed for muscle spasms.   fluticasone 50 MCG/ACT nasal spray Commonly known as:  FLONASE USE TWO SPRAY(S) IN EACH NOSTRIL ONCE DAILY   gabapentin 300 MG capsule Commonly known as:  NEURONTIN TAKE ONE CAPSULE BY MOUTH TWICE DAILY   glucose blood test  strip Commonly known as:  BAYER CONTOUR NEXT TEST Use to check blood sugars once daily.  Dx: E11.42   meloxicam 15 MG tablet Commonly known as:  MOBIC TAKE ONE TABLET BY MOUTH ONCE DAILY   metFORMIN 1000 MG tablet Commonly known as:  GLUCOPHAGE TAKE 1 TABLET BY MOUTH TWICE DAILY WITH FOOD   pioglitazone 45 MG tablet Commonly known as:  ACTOS Take 1 tablet (45 mg total) by mouth daily.  pravastatin 20 MG tablet Commonly known as:  PRAVACHOL TAKE ONE TABLET BY MOUTH ONCE DAILY   predniSONE 20 MG tablet Commonly known as:  DELTASONE 2 tabs po daily for 5 days, then 1 tab po daily for 5 days   sildenafil 20 MG tablet Commonly known as:  REVATIO Generic Revatio / Sildanefil 20 mg. 2 - 5 tabs 30 mins prior to intercourse.

## 2017-08-10 ENCOUNTER — Telehealth: Payer: Self-pay | Admitting: Family Medicine

## 2017-08-10 NOTE — Telephone Encounter (Unsigned)
Copied from [place] [id number]. Topic: Quick Communication - Rx Refill/Question >> Aug 10, 2017  2:46 PM Percell Belt A wrote: Medication: metFORMIN (GLUCOPHAGE) 1000 MG tablet [[id number] and pioglitazone (ACTOS) 45 MG tablet [[id number]]    Has the patient contacted their pharmacy? No    (Agent: If no, request that the patient contact the pharmacy for the refill.)   Preferred Pharmacy (with phone number or street name): [facility] [address]    Agent: Please be advised that RX refills may take up to 3 business days. We ask that you follow-up with your pharmacy.

## 2017-08-11 ENCOUNTER — Other Ambulatory Visit: Payer: Self-pay | Admitting: *Deleted

## 2017-08-11 MED ORDER — METFORMIN HCL 1000 MG PO TABS: 1000.0000 mg | ORAL_TABLET | Freq: Two times a day (BID) | ORAL | 0 refills | Status: DC

## 2017-08-17 ENCOUNTER — Other Ambulatory Visit: Payer: Self-pay | Admitting: Family Medicine

## 2017-08-18 ENCOUNTER — Other Ambulatory Visit: Payer: Self-pay | Admitting: Family Medicine

## 2017-08-18 DIAGNOSIS — E119 Type 2 diabetes mellitus without complications: Secondary | ICD-10-CM

## 2017-08-18 DIAGNOSIS — Z79899 Other long term (current) drug therapy: Secondary | ICD-10-CM

## 2017-08-18 DIAGNOSIS — E785 Hyperlipidemia, unspecified: Secondary | ICD-10-CM

## 2017-08-18 DIAGNOSIS — Z125 Encounter for screening for malignant neoplasm of prostate: Secondary | ICD-10-CM

## 2017-08-19 ENCOUNTER — Other Ambulatory Visit (INDEPENDENT_AMBULATORY_CARE_PROVIDER_SITE_OTHER): Payer: BLUE CROSS/BLUE SHIELD

## 2017-08-19 DIAGNOSIS — E785 Hyperlipidemia, unspecified: Secondary | ICD-10-CM

## 2017-08-19 DIAGNOSIS — E119 Type 2 diabetes mellitus without complications: Secondary | ICD-10-CM | POA: Diagnosis not present

## 2017-08-19 DIAGNOSIS — Z125 Encounter for screening for malignant neoplasm of prostate: Secondary | ICD-10-CM

## 2017-08-19 DIAGNOSIS — Z79899 Other long term (current) drug therapy: Secondary | ICD-10-CM

## 2017-08-19 LAB — CBC WITH DIFFERENTIAL/PLATELET
Basophils Absolute: 0 10*3/uL (ref 0.0–0.1)
Basophils Relative: 0.4 % (ref 0.0–3.0)
Eosinophils Absolute: 0.1 10*3/uL (ref 0.0–0.7)
Eosinophils Relative: 1.4 % (ref 0.0–5.0)
HCT: 49.1 % (ref 39.0–52.0)
Hemoglobin: 17 g/dL (ref 13.0–17.0)
Lymphocytes Relative: 27.7 % (ref 12.0–46.0)
Lymphs Abs: 1.3 10*3/uL (ref 0.7–4.0)
MCHC: 34.7 g/dL (ref 30.0–36.0)
MCV: 89 fl (ref 78.0–100.0)
Monocytes Absolute: 0.5 10*3/uL (ref 0.1–1.0)
Monocytes Relative: 10.3 % (ref 3.0–12.0)
Neutro Abs: 2.8 10*3/uL (ref 1.4–7.7)
Neutrophils Relative %: 60.2 % (ref 43.0–77.0)
Platelets: 182 10*3/uL (ref 150.0–400.0)
RBC: 5.52 Mil/uL (ref 4.22–5.81)
RDW: 12.7 % (ref 11.5–15.5)
WBC: 4.7 10*3/uL (ref 4.0–10.5)

## 2017-08-19 LAB — MICROALBUMIN / CREATININE URINE RATIO
Creatinine,U: 170.9 mg/dL
Microalb Creat Ratio: 4.9 mg/g (ref 0.0–30.0)
Microalb, Ur: 8.4 mg/dL — ABNORMAL HIGH (ref 0.0–1.9)

## 2017-08-19 LAB — HEMOGLOBIN A1C: Hgb A1c MFr Bld: 8.5 % — ABNORMAL HIGH (ref 4.6–6.5)

## 2017-08-19 LAB — PSA: PSA: 0.9 ng/mL (ref 0.10–4.00)

## 2017-08-20 LAB — HEPATIC FUNCTION PANEL
ALT: 38 U/L (ref 0–53)
AST: 19 U/L (ref 0–37)
Albumin: 4.7 g/dL (ref 3.5–5.2)
Alkaline Phosphatase: 53 U/L (ref 39–117)
Bilirubin, Direct: 0.1 mg/dL (ref 0.0–0.3)
Total Bilirubin: 0.4 mg/dL (ref 0.2–1.2)
Total Protein: 6.7 g/dL (ref 6.0–8.3)

## 2017-08-20 LAB — BASIC METABOLIC PANEL
BUN: 24 mg/dL — ABNORMAL HIGH (ref 6–23)
CO2: 26 mEq/L (ref 19–32)
Calcium: 10.1 mg/dL (ref 8.4–10.5)
Chloride: 100 mEq/L (ref 96–112)
Creatinine, Ser: 0.84 mg/dL (ref 0.40–1.50)
GFR: 106.89 mL/min (ref >60.00)
Glucose, Bld: 278 mg/dL — ABNORMAL HIGH (ref 70–99)
Potassium: 5 mEq/L (ref 3.5–5.1)
Sodium: 140 mEq/L (ref 135–145)

## 2017-08-20 LAB — LIPID PANEL
Cholesterol: 169 mg/dL (ref 0–200)
HDL: 41.5 mg/dL (ref >39.00)
LDL Cholesterol: 88 mg/dL (ref 0–99)
NonHDL: 127.02
Total CHOL/HDL Ratio: 4
Triglycerides: 195 mg/dL — ABNORMAL HIGH (ref 0.0–149.0)
VLDL: 39 mg/dL (ref 0.0–40.0)

## 2017-08-24 ENCOUNTER — Encounter: Payer: Self-pay | Admitting: Family Medicine

## 2017-08-24 ENCOUNTER — Other Ambulatory Visit: Payer: Self-pay

## 2017-08-24 ENCOUNTER — Ambulatory Visit (INDEPENDENT_AMBULATORY_CARE_PROVIDER_SITE_OTHER): Payer: BLUE CROSS/BLUE SHIELD | Admitting: Family Medicine

## 2017-08-24 VITALS — BP 132/80 | HR 67 | Temp 97.9°F | Ht 67.25 in | Wt 239.8 lb

## 2017-08-24 DIAGNOSIS — Z0000 Encounter for general adult medical examination without abnormal findings: Secondary | ICD-10-CM | POA: Diagnosis not present

## 2017-08-24 DIAGNOSIS — Z23 Encounter for immunization: Secondary | ICD-10-CM | POA: Diagnosis not present

## 2017-08-24 MED ORDER — PRAVASTATIN SODIUM 20 MG PO TABS: 20.0000 mg | ORAL_TABLET | Freq: Every day | ORAL | 3 refills | Status: DC

## 2017-08-24 MED ORDER — PIOGLITAZONE HCL 45 MG PO TABS: 45.0000 mg | ORAL_TABLET | Freq: Every day | ORAL | 3 refills | Status: DC

## 2017-08-24 MED ORDER — GLIPIZIDE 5 MG PO TABS: 5.0000 mg | ORAL_TABLET | Freq: Two times a day (BID) | ORAL | 1 refills | Status: DC

## 2017-08-24 MED ORDER — METFORMIN HCL 1000 MG PO TABS: 1000.0000 mg | ORAL_TABLET | Freq: Two times a day (BID) | ORAL | 3 refills | Status: DC

## 2017-08-24 NOTE — Progress Notes (Signed)
Dr. Frederico Hamman T. Akira Perusse, MD, [facility] [place], [address] Phone: [phone] Fax: [phone]  08/24/2017  Patient: Lonnie Jones, MRN: [id number], DOB: 03-10-1976, 42 y.o.  Primary Physician:  Owens Loffler, MD   Chief Complaint  Patient presents with  . Annual Exam   Subjective:   Lonnie Jones is a 42 y.o. pleasant patient who presents with the following:  Preventative Health Maintenance Visit:  Health Maintenance Summary Reviewed and updated, unless pt declines services.  Tobacco History Reviewed. Dip Alcohol: No concerns, no excessive use Exercise Habits: rare activity, rec at least 30 mins 5 times a week STD concerns: no risk or activity to increase risk Drug Use: None Encouraged self-testicular check  Eating has gotten worse.   Health Maintenance  Topic Date Due  . FOOT EXAM  08/20/2017  . INFLUENZA VACCINE  08/30/2017 (Originally 12/31/2016)  . OPHTHALMOLOGY EXAM  10/13/2017  . HEMOGLOBIN A1C  02/19/2018  . URINE MICROALBUMIN  08/20/2018  . TETANUS/TDAP  07/28/2022  . PNEUMOCOCCAL POLYSACCHARIDE VACCINE  Completed  . HIV Screening  Completed   Immunization History  Administered Date(s) Administered  . Influenza, Seasonal, Injecte, Preservative Fre 07/28/2012  . Influenza,inj,Quad PF,6+ Mos 01/26/2013, 02/15/2015, 05/12/2016  . Pneumococcal Polysaccharide-23 07/28/2012, 08/24/2017  . Tdap 07/28/2012   Patient Active Problem List   Diagnosis Date Noted  . Acute idiopathic gout involving toe 06/22/2017  . Peripheral autonomic neuropathy due to diabetes mellitus ([place]) 02/16/2015  . Allergic rhinitis due to allergen 08/25/2011  . Hyperlipidemia with target LDL less than 70 12/16/2010  . Type 2 diabetes mellitus with peripheral neuropathy ([place]) 08/08/2010  . MIGRAINE HEADACHE 08/08/2010  . ASTHMA 08/08/2010  . GERD 08/08/2010   Past Medical History:  Diagnosis Date  . Acute idiopathic  gout involving toe 06/22/2017  . Allergic rhinitis due to allergen 08/25/2011  . Allergy   . Asthma   . Diabetes mellitus   . GERD (gastroesophageal reflux disease)   . Hyperlipidemia LDL goal < 70 12/16/2010  . Migraine headache   . Type 2 diabetes mellitus with peripheral neuropathy ([place]) 08/08/2010   Qualifier: Diagnosis of  By: Lorelei Pont MD, Frederico Hamman     Past Surgical History:  Procedure Laterality Date  . APPENDECTOMY  2000  . CARPAL TUNNEL RELEASE    . KNEE SURGERY  2011   Social History   Socioeconomic History  . Marital status: Married    Spouse name: Not on file  . Number of children: 2  . Years of education: Not on file  . Highest education level: Not on file  Occupational History    Comment: Sewer  Social Needs  . Financial resource strain: Not on file  . Food insecurity:    Worry: Not on file    Inability: Not on file  . Transportation needs:    Medical: Not on file    Non-medical: Not on file  Tobacco Use  . Smoking status: Former Smoker    Last attempt to quit: 08/19/2001    Years since quitting: 16.0  . Smokeless tobacco: Current User    Types: Snuff  Substance and Sexual Activity  . Alcohol use: No    Alcohol/week: 0.0 oz  . Drug use: No  . Sexual activity: Not on file  Lifestyle  . Physical activity:    Days per week: Not on file    Minutes per session: Not on file  . Stress: Not on file  Relationships  . Social connections:    Talks on phone: Not on file    Gets together: Not on file    Attends religious service: Not on file    Active member of club or organization: Not on file    Attends meetings of clubs or organizations: Not on file    Relationship status: Not on file  . Intimate partner violence:    Fear of current or ex partner: Not on file    Emotionally abused: Not on file    Physically abused: Not on file    Forced sexual activity: Not on file  Other Topics Concern  . Not on file  Social History Narrative  . Not on file   History  reviewed. No pertinent family history. Allergies  Allergen Reactions  . Codeine     Medication list has been reviewed and updated.   General: Denies fever, chills, sweats. No significant weight loss. Eyes: Denies blurring,significant itching ENT: Denies earache, sore throat, and hoarseness. Cardiovascular: Denies chest pains, palpitations, dyspnea on exertion Respiratory: Denies cough, dyspnea at rest,wheeezing Breast: no concerns about lumps GI: Denies nausea, vomiting, diarrhea, constipation, change in bowel habits, abdominal pain, melena, hematochezia GU: Denies penile discharge, ED, urinary flow / outflow problems. No STD concerns. Musculoskeletal: Denies back pain, joint pain Derm: Denies rash, itching Neuro: Denies  paresthesias, frequent falls, frequent headaches Psych: Denies depression, anxiety Endocrine: Denies cold intolerance, heat intolerance, polydipsia Heme: Denies enlarged lymph nodes Allergy: No hayfever  Objective:   BP 132/80   Pulse 67   Temp 97.9 F (36.6 C) (Oral)   Ht 5' 7.25" (1.708 m)   Wt 239 lb 12 oz (108.7 kg)   BMI 37.27 kg/m  Ideal Body Weight: Weight in (lb) to have BMI = 25: 160.5  No exam data present  GEN: well developed, well nourished, no acute distress Eyes: conjunctiva and lids normal, PERRLA, EOMI ENT: TM clear, nares clear, oral exam WNL Neck: supple, no lymphadenopathy, no thyromegaly, no JVD Pulm: clear to auscultation and percussion, respiratory effort normal CV: regular rate and rhythm, S1-S2, no murmur, rub or gallop, no bruits, peripheral pulses normal and symmetric, no cyanosis, clubbing, edema or varicosities GI: soft, non-tender; no hepatosplenomegaly, masses; active bowel sounds all quadrants GU: no hernia, testicular mass, penile discharge Lymph: no cervical, axillary or inguinal adenopathy MSK: gait normal, muscle tone and strength WNL, no joint swelling, effusions, discoloration, crepitus  SKIN: clear, good turgor,  color WNL, no rashes, lesions, or ulcerations Neuro: normal mental status, normal strength, sensation, and motion Psych: alert; oriented to person, place and time, normally interactive and not anxious or depressed in appearance. All labs reviewed with patient.  Lipids:    Component Value Date/Time   CHOL 169 08/19/2017 0800   TRIG 195.0 (H) 08/19/2017 0800   HDL 41.50 08/19/2017 0800   LDLDIRECT 88.0 08/15/2016 0936   VLDL 39.0 08/19/2017 0800   CHOLHDL 4 08/19/2017 0800   CBC: CBC Latest Ref Rng & Units 08/19/2017 08/15/2016 08/08/2015  WBC 4.0 - 10.5 K/uL 4.7 4.4 5.2  Hemoglobin 13.0 - 17.0 g/dL 17.0 16.8 16.8  Hematocrit 39.0 - 52.0 % 49.1 48.1 48.0  Platelets 150.0 - 400.0 K/uL 182.0 185.0 665.9    Basic Metabolic Panel:    Component Value Date/Time   NA 140 08/19/2017 0800   K 5.0 08/19/2017 0800   CL 100 08/19/2017 0800   CO2 26 08/19/2017 0800   BUN 24 (H) 08/19/2017 0800   CREATININE 0.84  08/19/2017 0800   GLUCOSE 278 (H) 08/19/2017 0800   CALCIUM 10.1 08/19/2017 0800   Hepatic Function Latest Ref Rng & Units 08/19/2017 08/15/2016 08/08/2015  Total Protein 6.0 - 8.3 g/dL 6.7 6.4 6.7  Albumin 3.5 - 5.2 g/dL 4.7 4.4 4.5  AST 0 - 37 U/L 19 14 22   ALT 0 - 53 U/L 38 28 52  Alk Phosphatase 39 - 117 U/L 53 61 52  Total Bilirubin 0.2 - 1.2 mg/dL 0.4 0.8 0.8  Bilirubin, Direct 0.0 - 0.3 mg/dL 0.1 0.2 0.1    Lab Results  Component Value Date   TSH 1.26 08/15/2016   Lab Results  Component Value Date   PSA 0.90 08/19/2017   PSA 0.94 08/15/2016    Assessment and Plan:   Healthcare maintenance  Need for prophylactic vaccination against Streptococcus pneumoniae (pneumococcus) - Plan: Pneumococcal polysaccharide vaccine 23-valent greater than or equal to 2yo subcutaneous/IM  Health Maintenance Exam: The patient's preventative maintenance and recommended screening tests for an annual wellness exam were reviewed in full today. Brought up to date unless services  declined.  Counselled on the importance of diet, exercise, and its role in overall health and mortality. The patient's FH and SH was reviewed, including their home life, tobacco status, and drug and alcohol status.  Follow-up in 1 year for physical exam or additional follow-up below.  Follow-up: Return in about 6 months (around 02/24/2018). Or follow-up in 1 year if not noted.  Meds ordered this encounter  Medications  . glipiZIDE (GLUCOTROL) 5 MG tablet    Sig: Take 1 tablet (5 mg total) by mouth 2 (two) times daily before a meal.    Dispense:  180 tablet    Refill:  1  . pravastatin (PRAVACHOL) 20 MG tablet    Sig: Take 1 tablet (20 mg total) by mouth daily.    Dispense:  90 tablet    Refill:  3  . pioglitazone (ACTOS) 45 MG tablet    Sig: Take 1 tablet (45 mg total) by mouth daily.    Dispense:  90 tablet    Refill:  3  . metFORMIN (GLUCOPHAGE) 1000 MG tablet    Sig: Take 1 tablet (1,000 mg total) by mouth 2 (two) times daily with a meal.    Dispense:  180 tablet    Refill:  3   Medications Discontinued During This Encounter  Medication Reason  . cyclobenzaprine (FLEXERIL) 10 MG tablet Completed Course  . predniSONE (DELTASONE) 20 MG tablet Completed Course  . pravastatin (PRAVACHOL) 20 MG tablet Reorder  . pioglitazone (ACTOS) 45 MG tablet Reorder  . metFORMIN (GLUCOPHAGE) 1000 MG tablet Reorder   Orders Placed This Encounter  Procedures  . Pneumococcal polysaccharide vaccine 23-valent greater than or equal to 2yo subcutaneous/IM  . HM DIABETES EYE EXAM    Signed,  Berlin Mokry T. Dajanee Voorheis, MD   Allergies as of 08/24/2017      Reactions   Codeine       Medication List        Accurate as of 08/24/17 11:59 PM. Always use your most recent med list.          BAYER CONTOUR NEXT MONITOR w/Device Kit 1 each by Does not apply route daily.   BAYER MICROLET LANCETS lancets Use to check blood sugars once daily. E11.42   fluticasone 50 MCG/ACT nasal spray Commonly  known as:  FLONASE USE TWO SPRAY(S) IN EACH NOSTRIL ONCE DAILY   gabapentin 300 MG capsule Commonly known as:  NEURONTIN TAKE ONE CAPSULE BY MOUTH TWICE DAILY   glipiZIDE 5 MG tablet Commonly known as:  GLUCOTROL Take 1 tablet (5 mg total) by mouth 2 (two) times daily before a meal.   glucose blood test strip Commonly known as:  BAYER CONTOUR NEXT TEST Use to check blood sugars once daily.  Dx: E11.42   meloxicam 15 MG tablet Commonly known as:  MOBIC TAKE ONE TABLET BY MOUTH ONCE DAILY   metFORMIN 1000 MG tablet Commonly known as:  GLUCOPHAGE Take 1 tablet (1,000 mg total) by mouth 2 (two) times daily with a meal.   pioglitazone 45 MG tablet Commonly known as:  ACTOS Take 1 tablet (45 mg total) by mouth daily.   pravastatin 20 MG tablet Commonly known as:  PRAVACHOL Take 1 tablet (20 mg total) by mouth daily.   sildenafil 20 MG tablet Commonly known as:  REVATIO Generic Revatio / Sildanefil 20 mg. 2 - 5 tabs 30 mins prior to intercourse.

## 2017-08-24 NOTE — Patient Instructions (Signed)
Glipizide - for the first week take 1 tablet, and then increase to 1 tablet twice a day

## 2017-10-13 ENCOUNTER — Ambulatory Visit: Payer: Self-pay

## 2017-10-13 ENCOUNTER — Ambulatory Visit: Payer: BLUE CROSS/BLUE SHIELD | Admitting: Internal Medicine

## 2017-10-13 ENCOUNTER — Encounter: Payer: Self-pay | Admitting: Internal Medicine

## 2017-10-13 VITALS — BP 134/82 | HR 78 | Temp 98.2°F | Wt 245.0 lb

## 2017-10-13 DIAGNOSIS — J069 Acute upper respiratory infection, unspecified: Secondary | ICD-10-CM | POA: Diagnosis not present

## 2017-10-13 MED ORDER — AZITHROMYCIN 250 MG PO TABS: ORAL_TABLET | ORAL | 0 refills | Status: DC

## 2017-10-13 NOTE — Telephone Encounter (Signed)
Pt has appt 10/13/17 at 3:45 with Avie Echevaria NP.

## 2017-10-13 NOTE — Patient Instructions (Signed)
Upper Respiratory Infection, Adult Most upper respiratory infections (URIs) are caused by a virus. A URI affects the nose, throat, and upper air passages. The most common type of URI is often called "the common cold." Follow these instructions at home:  Take medicines only as told by your doctor.  Gargle warm saltwater or take cough drops to comfort your throat as told by your doctor.  Use a warm mist humidifier or inhale steam from a shower to increase air moisture. This may make it easier to breathe.  Drink enough fluid to keep your pee (urine) clear or pale yellow.  Eat soups and other clear broths.  Have a healthy diet.  Rest as needed.  Go back to work when your fever is gone or your doctor says it is okay. ? You may need to stay home longer to avoid giving your URI to others. ? You can also wear a face mask and wash your hands often to prevent spread of the virus.  Use your inhaler more if you have asthma.  Do not use any tobacco products, including cigarettes, chewing tobacco, or electronic cigarettes. If you need help quitting, ask your doctor. Contact a doctor if:  You are getting worse, not better.  Your symptoms are not helped by medicine.  You have chills.  You are getting more short of breath.  You have brown or red mucus.  You have yellow or brown discharge from your nose.  You have pain in your face, especially when you bend forward.  You have a fever.  You have puffy (swollen) neck glands.  You have pain while swallowing.  You have white areas in the back of your throat. Get help right away if:  You have very bad or constant: ? Headache. ? Ear pain. ? Pain in your forehead, behind your eyes, and over your cheekbones (sinus pain). ? Chest pain.  You have long-lasting (chronic) lung disease and any of the following: ? Wheezing. ? Long-lasting cough. ? Coughing up blood. ? A change in your usual mucus.  You have a stiff neck.  You have  changes in your: ? Vision. ? Hearing. ? Thinking. ? Mood. This information is not intended to replace advice given to you by your health care provider. Make sure you discuss any questions you have with your health care provider. Document Released: 11/05/2007 Document Revised: 01/20/2016 Document Reviewed: 08/24/2013 Elsevier Interactive Patient Education  2018 [employer]  

## 2017-10-13 NOTE — Progress Notes (Signed)
HPI  Pt presents to the clinic today with c/o watery eyes, runny nose, sore throat and cough. This started 1 week ago. He is blowing clear mucous out of his nose. He denies difficulty swallowing. The cough is productive of blood tinged mucous. He denies fever, chills or body aches. He has tried Zicam, Zyrtec, Claritin, Flonase and Sudafed with minimal relief. He has a history of allergies, asthma and DM 2.  Review of Systems      Past Medical History:  Diagnosis Date  . Acute idiopathic gout involving toe 06/22/2017  . Allergic rhinitis due to allergen 08/25/2011  . Allergy   . Asthma   . Diabetes mellitus   . GERD (gastroesophageal reflux disease)   . Hyperlipidemia LDL goal < 70 12/16/2010  . Migraine headache   . Type 2 diabetes mellitus with peripheral neuropathy ([place]) 08/08/2010   Qualifier: Diagnosis of  By: Copland MD, Frederico Hamman      No family history on file.  Social History   Socioeconomic History  . Marital status: Married    Spouse name: Not on file  . Number of children: 2  . Years of education: Not on file  . Highest education level: Not on file  Occupational History    Comment: Sewer  Social Needs  . Financial resource strain: Not on file  . Food insecurity:    Worry: Not on file    Inability: Not on file  . Transportation needs:    Medical: Not on file    Non-medical: Not on file  Tobacco Use  . Smoking status: Former Smoker    Last attempt to quit: 08/19/2001    Years since quitting: 16.1  . Smokeless tobacco: Current User    Types: Snuff  Substance and Sexual Activity  . Alcohol use: No    Alcohol/week: 0.0 oz  . Drug use: No  . Sexual activity: Not on file  Lifestyle  . Physical activity:    Days per week: Not on file    Minutes per session: Not on file  . Stress: Not on file  Relationships  . Social connections:    Talks on phone: Not on file    Gets together: Not on file    Attends religious service: Not on file    Active member of club or  organization: Not on file    Attends meetings of clubs or organizations: Not on file    Relationship status: Not on file  . Intimate partner violence:    Fear of current or ex partner: Not on file    Emotionally abused: Not on file    Physically abused: Not on file    Forced sexual activity: Not on file  Other Topics Concern  . Not on file  Social History Narrative  . Not on file    Allergies  Allergen Reactions  . Codeine      Constitutional: Denies headache, fatigue, fever or abrupt weight changes.  HEENT:  Positive watery eyes, runny nose, sore throat. Denies eye redness, eye pain, pressure behind the eyes, facial pain, nasal congestion, ear pain, ringing in the ears, wax buildup, or bloody nose. Respiratory: Positive cough. Denies difficulty breathing or shortness of breath.  Cardiovascular: Denies chest pain, chest tightness, palpitations or swelling in the hands or feet.   No other specific complaints in a complete review of systems (except as listed in HPI above).  Objective:  BP 134/82   Pulse 78   Temp 98.2 F (36.8 C) (  Oral)   Wt 245 lb (111.1 kg)   SpO2 97%   BMI 38.09 kg/m   Wt Readings from Last 3 Encounters:  10/13/17 245 lb (111.1 kg)  08/24/17 239 lb 12 oz (108.7 kg)  06/22/17 245 lb (111.1 kg)     General: Appears his stated age, well developed, well nourished in NAD. HEENT: Head: normal shape and size, no sinus tenderness noted; Ears: Tm's gray and intact, normal light reflex; Nose: mucosa pink and moist, septum midline; Throat/Mouth: + PND. Teeth present, mucosa erythematous and moist, no exudate noted, no lesions or ulcerations noted.  Neck: No cervical lymphadenopathy.  Cardiovascular: Normal rate and rhythm. S1,S2 noted.  No murmur, rubs or gallops noted.  Pulmonary/Chest: Normal effort and positive vesicular breath sounds. No respiratory distress. No wheezes, rales or ronchi noted.       Assessment & Plan:   Upper Respiratory  Infection:  Get some rest and drink plenty of water 80 mg Depo IM today eRx for Azithromax x 5 days Delsym as needed for cough  RTC as needed or if symptoms persist.   Webb Silversmith, NP

## 2017-10-13 NOTE — Telephone Encounter (Signed)
Pt. Reports 3-4 day history of cough. "It's getting worse." Reports dark brown mucus and wheezing, day and night. Denies chest pain. Has some runny nose. Denies fever. Appointment made for today.  Reason for Disposition . SEVERE coughing spells (e.g., whooping sound after coughing, vomiting after coughing)  Answer Assessment - Initial Assessment Questions 1. ONSET: "When did the cough begin?"      Started 3-4 days 2. SEVERITY: "How bad is the cough today?"      Severe 3. RESPIRATORY DISTRESS: "Describe your breathing."      Some wheezing 4. FEVER: "Do you have a fever?" If so, ask: "What is your temperature, how was it measured, and when did it start?"     No 5. SPUTUM: "Describe the color of your sputum" (clear, white, yellow, green)     Dark brownish 6. HEMOPTYSIS: "Are you coughing up any blood?" If so ask: "How much?" (flecks, streaks, tablespoons, etc.)     No 7. CARDIAC HISTORY: "Do you have any history of heart disease?" (e.g., heart attack, congestive heart failure)      No 8. LUNG HISTORY: "Do you have any history of lung disease?"  (e.g., pulmonary embolus, asthma, emphysema)     Had asthma 9. PE RISK FACTORS: "Do you have a history of blood clots?" (or: recent major surgery, recent prolonged travel, bedridden )     No 10. OTHER SYMPTOMS: "Do you have any other symptoms?" (e.g., runny nose, wheezing, chest pain)       Wheezing, cough, some runny nose 11. PREGNANCY: "Is there any chance you are pregnant?" "When was your last menstrual period?"       No 12. TRAVEL: "Have you traveled out of the country in the last month?" (e.g., travel history, exposures)       No  Protocols used: [place]

## 2017-10-16 ENCOUNTER — Ambulatory Visit: Payer: Self-pay | Admitting: *Deleted

## 2017-10-16 ENCOUNTER — Encounter: Payer: Self-pay | Admitting: Family Medicine

## 2017-10-16 ENCOUNTER — Ambulatory Visit: Payer: BLUE CROSS/BLUE SHIELD | Admitting: Family Medicine

## 2017-10-16 VITALS — BP 128/78 | HR 78 | Temp 98.0°F | Ht 67.25 in | Wt 239.5 lb

## 2017-10-16 DIAGNOSIS — R05 Cough: Secondary | ICD-10-CM

## 2017-10-16 DIAGNOSIS — R059 Cough, unspecified: Secondary | ICD-10-CM

## 2017-10-16 MED ORDER — ALBUTEROL SULFATE HFA 108 (90 BASE) MCG/ACT IN AERS: 2.0000 | INHALATION_SPRAY | RESPIRATORY_TRACT | 1 refills | Status: DC | PRN

## 2017-10-16 MED ORDER — BENZONATATE 100 MG PO CAPS: 100.0000 mg | ORAL_CAPSULE | Freq: Three times a day (TID) | ORAL | 0 refills | Status: DC | PRN

## 2017-10-16 NOTE — Patient Instructions (Signed)
Good to see you today  I've prescribed an albuterol inhaler for use every 4-6 hours for cough, wheeze and cough pills  Can take Tylenol 2 tablets every 8 to 12 hours as needed for pain/sore throat  Can try regular Sudafed, one in am, one after lunchtime for sinus pressure/congestion  Continue daily antihistamine

## 2017-10-16 NOTE — Telephone Encounter (Signed)
Pt called with complaints of wheezing and shortness of breath; he states that he was seen in office 10/13/17 and was given Depo shot and Z-pak; he states that he is still wheezing, coughing, sore throat and still feels like he has an elephant on his chest; he also says that he is leaving for the beach today at 12 noon; recommendations made per nurse triage to include being seen in office today; pt offered and accepted appointment with Clarene Reamer, [place], today at 1100; he verbalizes understanding; will route to office for notification of this upcoming appointment.   Reason for Disposition . [1] Taking antibiotics > 24 hours AND [2] symptoms WORSE . [1] Taking antibiotic > 72 hours (3 days) AND [2] symptoms (other than fever) not improved  Answer Assessment - Initial Assessment Questions 1. INFECTION: "What infection is the antibiotic being given for?"     Upper respiratory tract 2. ANTIBIOTIC: "What antibiotic are you taking" "How many times per day?"     Azithromycin 250 mg daily 3. DURATION: "When was the antibiotic started?"     10/13/17 4. MAIN CONCERN OR SYMPTOM:  "What is your main concern right now?"     Cough, wheezing, sore throat 5. BETTER-SAME-WORSE: "Are you getting better, staying the same, or getting worse compared to when you first started the antibiotics?" If getting worse, ask: "In what way?"    same 6. FEVER: "Do you have a fever?" If so, ask: "What is your temperature, how was it measured, and when did it start?"     no 7. SYMPTOMS: "Are there any other symptoms you're concerned about?" If so, ask: "When did it start?"     All of the above 8. FOLLOW-UP APPOINTMENT: "Do you have a follow-up appointment with your doctor?"     no  Protocols used: INFECTION ON ANTIBIOTIC FOLLOW-UP CALL-A-AH

## 2017-10-16 NOTE — Progress Notes (Signed)
Subjective:    Patient ID: Lonnie Jones, male    DOB: 1976/03/12, 42 y.o.   MRN: [id number]  HPI This is a 42 yo male who presents today not feeling better from visit 3 days ago. At that visit he was diagnosed with upper respiratory infection and given Depo 80 mg, Zpack. Today he reports continued dry cough, wheeze off and on, sore throat, runny nose in am (clear), no ear pain, no headache. Taking azithromycin, has 1 pill left. Took Allegra, no cough medicine, takes Meloxicam daily for shoulder pain. No fever. Feels SOB. Has history of asthma, has had inhaler in the past, not for 5 years. Not fatigued.   Past Medical History:  Diagnosis Date  . Acute idiopathic gout involving toe 06/22/2017  . Allergic rhinitis due to allergen 08/25/2011  . Allergy   . Asthma   . Diabetes mellitus   . GERD (gastroesophageal reflux disease)   . Hyperlipidemia LDL goal < 70 12/16/2010  . Migraine headache   . Type 2 diabetes mellitus with peripheral neuropathy ([place]) 08/08/2010   Qualifier: Diagnosis of  By: Lorelei Pont MD, Frederico Hamman     Past Surgical History:  Procedure Laterality Date  . APPENDECTOMY  2000  . CARPAL TUNNEL RELEASE    . KNEE SURGERY  2011   No family history on file. Social History   Tobacco Use  . Smoking status: Former Smoker    Last attempt to quit: 08/19/2001    Years since quitting: 16.1  . Smokeless tobacco: Current User    Types: Snuff  Substance Use Topics  . Alcohol use: No    Alcohol/week: 0.0 oz  . Drug use: No      Review of Systems Per HPI    Objective:   Physical Exam  Constitutional: He is oriented to person, place, and time. He appears well-developed and well-nourished. No distress.  HENT:  Head: Normocephalic and atraumatic.  Right Ear: External ear normal.  Left Ear: External ear normal.  Nose: Nose normal.  Mouth/Throat: Oropharynx is clear and moist. No oropharyngeal exudate.  Eyes: Conjunctivae are normal.  Neck: Normal range of motion. Neck supple.   Cardiovascular: Normal rate, regular rhythm and normal heart sounds.  Pulmonary/Chest: Effort normal and breath sounds normal. No stridor. No respiratory distress. He has no wheezes. He has no rales.  Lymphadenopathy:    He has no cervical adenopathy.  Neurological: He is alert and oriented to person, place, and time.  Skin: Skin is warm and dry. He is not diaphoretic.  Psychiatric: He has a normal mood and affect. His behavior is normal. Judgment and thought content normal.  Vitals reviewed.     BP 128/78 (BP Location: Right Arm, Patient Position: Sitting, Cuff Size: Normal)   Pulse 78   Temp 98 F (36.7 C) (Oral)   Ht 5' 7.25" (1.708 m)   Wt 239 lb 8 oz (108.6 kg)   SpO2 95%   BMI 37.23 kg/m  Wt Readings from Last 3 Encounters:  10/16/17 239 lb 8 oz (108.6 kg)  10/13/17 245 lb (111.1 kg)  08/24/17 239 lb 12 oz (108.7 kg)       Assessment & Plan:  1. Cough - reassured patient, lungs are clear, he was instructed to complete course of antibiotic - RTC precautions reviewed - albuterol (PROVENTIL HFA;VENTOLIN HFA) 108 (90 Base) MCG/ACT inhaler; Inhale 2 puffs into the lungs every 4 (four) hours as needed for wheezing or shortness of breath (cough, shortness of breath or  wheezing.).  Dispense: 1 Inhaler; Refill: 1 - benzonatate (TESSALON) 100 MG capsule; Take 1-2 capsules (100-200 mg total) by mouth 3 (three) times daily as needed.  Dispense: 30 capsule; Refill: 0 -  Patient Instructions  Good to see you today  I've prescribed an albuterol inhaler for use every 4-6 hours for cough, wheeze and cough pills  Can take Tylenol 2 tablets every 8 to 12 hours as needed for pain/sore throat  Can try regular Sudafed, one in am, one after lunchtime for sinus pressure/congestion  Continue daily antihistamine      Clarene Reamer, FNP-BC  [facility] at [facility], [place]  10/20/2017 8:02 PM

## 2017-10-20 ENCOUNTER — Encounter: Payer: Self-pay | Admitting: Family Medicine

## 2017-11-17 DIAGNOSIS — E119 Type 2 diabetes mellitus without complications: Secondary | ICD-10-CM | POA: Diagnosis not present

## 2017-11-17 LAB — HM DIABETES EYE EXAM

## 2017-12-02 ENCOUNTER — Other Ambulatory Visit: Payer: Self-pay | Admitting: Family Medicine

## 2017-12-02 DIAGNOSIS — R059 Cough, unspecified: Secondary | ICD-10-CM

## 2017-12-02 DIAGNOSIS — R05 Cough: Secondary | ICD-10-CM

## 2017-12-02 NOTE — Telephone Encounter (Signed)
Please Advise Last OV was an acute cough visit  Filled on 10/16/17 I inhaler with 1 refill

## 2017-12-17 ENCOUNTER — Encounter: Payer: Self-pay | Admitting: Family Medicine

## 2017-12-18 MED ORDER — GLUCOSE BLOOD VI STRP: ORAL_STRIP | 1 refills | Status: DC

## 2018-01-04 ENCOUNTER — Other Ambulatory Visit: Payer: Self-pay | Admitting: Family Medicine

## 2018-01-04 NOTE — Telephone Encounter (Signed)
Last office visit 10/16/2017 with Lonnie Jones for cough.  Last refilled 01/09/2017 for #90 with 3 refills.  Ok to refill?

## 2018-02-10 ENCOUNTER — Ambulatory Visit: Payer: BLUE CROSS/BLUE SHIELD | Admitting: Family Medicine

## 2018-02-10 ENCOUNTER — Encounter: Payer: Self-pay | Admitting: Family Medicine

## 2018-02-10 VITALS — BP 122/72 | HR 88 | Temp 98.3°F | Resp 18 | Ht 67.25 in | Wt 243.0 lb

## 2018-02-10 DIAGNOSIS — R0981 Nasal congestion: Secondary | ICD-10-CM | POA: Diagnosis not present

## 2018-02-10 DIAGNOSIS — J309 Allergic rhinitis, unspecified: Secondary | ICD-10-CM

## 2018-02-10 DIAGNOSIS — E669 Obesity, unspecified: Secondary | ICD-10-CM

## 2018-02-10 MED ORDER — PREDNISONE 20 MG PO TABS: 20.0000 mg | ORAL_TABLET | Freq: Every day | ORAL | 0 refills | Status: DC

## 2018-02-10 MED ORDER — AMOXICILLIN-POT CLAVULANATE 875-125 MG PO TABS: 1.0000 | ORAL_TABLET | Freq: Two times a day (BID) | ORAL | 0 refills | Status: DC

## 2018-02-10 NOTE — Patient Instructions (Addendum)
Please add mucinex (plain) to help thin your secretions  Use saline nasal spray 4-5 times a day  Can use Afrin type nasal spray 2 times a day for 3 days max  Try switching your antihistamine  Drink enough water to make your urine light yellow  If not better in 3-4 days, start antibiotic

## 2018-02-10 NOTE — Progress Notes (Signed)
Subjective:    Patient ID: Lonnie Jones, male    DOB: 10/17/1975, 42 y.o.   MRN: [id number]  HPI This is a 42 yo male who presents today with nasal congestion, sore throat and cough x 10 days. No fever. Cough productive of dark sputum, no wheezing or SOB. Mostly symptoms are nasal drainage, sore throat, ear pressure. Taking sudafed daily in the morning and Allegra. No muscle aches. Not sleeping because of nasal congestion. Does not feel that symptoms are in his chest.  Had sinus surgery 2 years ago, didn't feel like it helped his chronic congestion. Has switched antihistamines, uses Flonase sporadically.   Doesn't check blood sugars at home. Struggles to make good food choices due to amount of snack food (donuts, chips, etc) that his wife brings into the home.   Past Medical History:  Diagnosis Date  . Acute idiopathic gout involving toe 06/22/2017  . Allergic rhinitis due to allergen 08/25/2011  . Allergy   . Asthma   . Diabetes mellitus   . GERD (gastroesophageal reflux disease)   . Hyperlipidemia LDL goal < 70 12/16/2010  . Migraine headache   . Type 2 diabetes mellitus with peripheral neuropathy ([place]) 08/08/2010   Qualifier: Diagnosis of  By: Lorelei Pont MD, Frederico Hamman        Review of Systems Per HPI    Objective:   Physical Exam  Constitutional: He is oriented to person, place, and time. He appears well-developed and well-nourished. He does not appear ill. No distress.  Audible nasal congestion.   HENT:  Head: Normocephalic and atraumatic.  Right Ear: Tympanic membrane and ear canal normal.  Left Ear: Tympanic membrane and ear canal normal.  Nose: Mucosal edema and rhinorrhea present. Right sinus exhibits no maxillary sinus tenderness and no frontal sinus tenderness. Left sinus exhibits no maxillary sinus tenderness and no frontal sinus tenderness.  Mouth/Throat: Uvula is midline and mucous membranes are normal. Posterior oropharyngeal erythema present. No tonsillar abscesses.  Tonsils are 2+ on the right. Tonsils are 2+ on the left. No tonsillar exudate.  Neck: Normal range of motion. Neck supple.  Cardiovascular: Normal rate, regular rhythm and normal heart sounds.  Pulmonary/Chest: Effort normal and breath sounds normal.  Lymphadenopathy:    He has no cervical adenopathy.  Neurological: He is alert and oriented to person, place, and time.  Skin: Skin is warm and dry.  Psychiatric: He has a normal mood and affect. His behavior is normal.  Vitals reviewed.     BP 122/72 (BP Location: Right Arm, Patient Position: Sitting, Cuff Size: Normal)   Pulse 88   Temp 98.3 F (36.8 C) (Oral)   Resp 18   Ht 5' 7.25" (1.708 m)   Wt 243 lb (110.2 kg)   SpO2 96%   BMI 37.78 kg/m  Wt Readings from Last 3 Encounters:  02/10/18 243 lb (110.2 kg)  10/16/17 239 lb 8 oz (108.6 kg)  10/13/17 245 lb (111.1 kg)       Assessment & Plan:  1. Nasal congestion - suspect viral illness on top of chronic allergic rhinitis, did provided wait and see prescription for augmentin if no improvement in 3-4 days - predniSONE (DELTASONE) 20 MG tablet; Take 1 tablet (20 mg total) by mouth daily with breakfast.  Dispense: 5 tablet; Refill: 0 - Ambulatory referral to Allergy -  Patient Instructions  Please add mucinex (plain) to help thin your secretions  Use saline nasal spray 4-5 times a day  Can use Afrin type  nasal spray 2 times a day for 3 days max  Try switching your antihistamine  Drink enough water to make your urine light yellow  If not better in 3-4 days, start antibiotic    2. Allergic rhinitis, unspecified seasonality, unspecified trigger - Ambulatory referral to Allergy  3. Obesity (BMI 30-39.9) - encouraged him to address stressors and sleep issues which are no doubt affecting weight, also discussed healthy food choices and his barriers - follow up with PCP next week as scheduled for routine follow up   Clarene Reamer, FNP-BC  [facility] at  [facility], [place]  02/10/2018 10:41 AM

## 2018-02-16 DIAGNOSIS — J4520 Mild intermittent asthma, uncomplicated: Secondary | ICD-10-CM | POA: Diagnosis not present

## 2018-02-16 DIAGNOSIS — J301 Allergic rhinitis due to pollen: Secondary | ICD-10-CM | POA: Diagnosis not present

## 2018-02-16 DIAGNOSIS — R05 Cough: Secondary | ICD-10-CM | POA: Diagnosis not present

## 2018-02-16 DIAGNOSIS — J3089 Other allergic rhinitis: Secondary | ICD-10-CM | POA: Diagnosis not present

## 2018-02-18 ENCOUNTER — Other Ambulatory Visit: Payer: BLUE CROSS/BLUE SHIELD

## 2018-02-18 ENCOUNTER — Other Ambulatory Visit: Payer: Self-pay | Admitting: Family Medicine

## 2018-02-22 DIAGNOSIS — J301 Allergic rhinitis due to pollen: Secondary | ICD-10-CM | POA: Diagnosis not present

## 2018-02-23 DIAGNOSIS — J3089 Other allergic rhinitis: Secondary | ICD-10-CM | POA: Diagnosis not present

## 2018-02-24 ENCOUNTER — Encounter: Payer: Self-pay | Admitting: Family Medicine

## 2018-02-24 ENCOUNTER — Ambulatory Visit: Payer: BLUE CROSS/BLUE SHIELD | Admitting: Family Medicine

## 2018-02-24 VITALS — BP 104/70 | HR 75 | Temp 98.1°F | Ht 67.25 in | Wt 241.0 lb

## 2018-02-24 DIAGNOSIS — E1142 Type 2 diabetes mellitus with diabetic polyneuropathy: Secondary | ICD-10-CM | POA: Diagnosis not present

## 2018-02-24 DIAGNOSIS — E785 Hyperlipidemia, unspecified: Secondary | ICD-10-CM

## 2018-02-24 DIAGNOSIS — Z23 Encounter for immunization: Secondary | ICD-10-CM | POA: Diagnosis not present

## 2018-02-24 LAB — HEMOGLOBIN A1C: Hgb A1c MFr Bld: 6.9 % — ABNORMAL HIGH (ref 4.6–6.5)

## 2018-02-24 LAB — HEPATIC FUNCTION PANEL
ALT: 33 U/L (ref 0–53)
AST: 17 U/L (ref 0–37)
Albumin: 4.4 g/dL (ref 3.5–5.2)
Alkaline Phosphatase: 46 U/L (ref 39–117)
Bilirubin, Direct: 0.2 mg/dL (ref 0.0–0.3)
Total Bilirubin: 1 mg/dL (ref 0.2–1.2)
Total Protein: 7 g/dL (ref 6.0–8.3)

## 2018-02-24 LAB — LIPID PANEL
Cholesterol: 140 mg/dL (ref 0–200)
HDL: 29.3 mg/dL — ABNORMAL LOW (ref >39.00)
LDL Cholesterol: 72 mg/dL (ref 0–99)
NonHDL: 110.28
Total CHOL/HDL Ratio: 5
Triglycerides: 190 mg/dL — ABNORMAL HIGH (ref 0.0–149.0)
VLDL: 38 mg/dL (ref 0.0–40.0)

## 2018-02-24 NOTE — Progress Notes (Signed)
Dr. Frederico Hamman T. Jalise Zawistowski, MD, [facility] [place], [address] Phone: [phone] Fax: [phone]  02/24/2018  Patient: Lonnie Jones, MRN: [id number], DOB: 1976/04/15, 42 y.o.  Primary Physician:  Owens Loffler, MD   Chief Complaint  Patient presents with  . Diabetes   Subjective:   Lonnie Jones is a 42 y.o. very pleasant male patient who presents with the following:  Wt Readings from Last 3 Encounters:  02/24/18 241 lb (109.3 kg)  02/10/18 243 lb (110.2 kg)  10/16/17 239 lb 8 oz (108.6 kg)    Busy all the time then off. Work is really stressful. Sometimes many call back to back.   Diet is terrible. Recently maybe better. Some decreased snacking.   Diabetes Mellitus: Tolerating Medications: yes Compliance with diet: fair/poor Exercise: minimal / intermittent Avg blood sugars at home: not checking Foot problems: none Hypoglycemia: none No nausea, vomitting, blurred vision, polyuria.  Lab Results  Component Value Date   HGBA1C 8.5 (H) 08/19/2017   HGBA1C 8.0 (H) 11/26/2016   HGBA1C 8.3 (H) 08/15/2016   Lab Results  Component Value Date   MICROALBUR 8.4 (H) 08/19/2017   LDLCALC 88 08/19/2017   CREATININE 0.84 08/19/2017    Wt Readings from Last 3 Encounters:  02/24/18 241 lb (109.3 kg)  02/10/18 243 lb (110.2 kg)  10/16/17 239 lb 8 oz (108.6 kg)    Body mass index is 37.47 kg/m.   Lipids: Doing well, stable. Tolerating meds fine with no SE. Panel reviewed with patient.  Lipids:    Component Value Date/Time   CHOL 169 08/19/2017 0800   TRIG 195.0 (H) 08/19/2017 0800   HDL 41.50 08/19/2017 0800   LDLDIRECT 88.0 08/15/2016 0936   VLDL 39.0 08/19/2017 0800   CHOLHDL 4 08/19/2017 0800    Lab Results  Component Value Date   ALT 38 08/19/2017   AST 19 08/19/2017   ALKPHOS 53 08/19/2017   BILITOT 0.4 08/19/2017     Past Medical History, Surgical History, Social History, Family  History, Problem List, Medications, and Allergies have been reviewed and updated if relevant.  Patient Active Problem List   Diagnosis Date Noted  . Acute idiopathic gout involving toe 06/22/2017  . Peripheral autonomic neuropathy due to diabetes mellitus ([place]) 02/16/2015  . Allergic rhinitis due to allergen 08/25/2011  . Hyperlipidemia with target LDL less than 70 12/16/2010  . Type 2 diabetes mellitus with peripheral neuropathy ([place]) 08/08/2010  . MIGRAINE HEADACHE 08/08/2010  . ASTHMA 08/08/2010  . GERD 08/08/2010    Past Medical History:  Diagnosis Date  . Acute idiopathic gout involving toe 06/22/2017  . Allergic rhinitis due to allergen 08/25/2011  . Allergy   . Asthma   . Diabetes mellitus   . GERD (gastroesophageal reflux disease)   . Hyperlipidemia LDL goal < 70 12/16/2010  . Migraine headache   . Type 2 diabetes mellitus with peripheral neuropathy ([place]) 08/08/2010   Qualifier: Diagnosis of  By: Lorelei Pont MD, Frederico Hamman      Past Surgical History:  Procedure Laterality Date  . APPENDECTOMY  2000  . CARPAL TUNNEL RELEASE    . KNEE SURGERY  2011    Social History   Socioeconomic History  . Marital status: Married    Spouse name: Not on file  . Number of children: 2  . Years of education: Not on file  . Highest education level: Not on file  Occupational History  Comment: Sewer  Social Needs  . Financial resource strain: Not on file  . Food insecurity:    Worry: Not on file    Inability: Not on file  . Transportation needs:    Medical: Not on file    Non-medical: Not on file  Tobacco Use  . Smoking status: Former Smoker    Last attempt to quit: 08/19/2001    Years since quitting: 16.5  . Smokeless tobacco: Current User    Types: Snuff  Substance and Sexual Activity  . Alcohol use: No    Alcohol/week: 0.0 standard drinks  . Drug use: No  . Sexual activity: Not on file  Lifestyle  . Physical activity:    Days per week: Not on file    Minutes per session:  Not on file  . Stress: Not on file  Relationships  . Social connections:    Talks on phone: Not on file    Gets together: Not on file    Attends religious service: Not on file    Active member of club or organization: Not on file    Attends meetings of clubs or organizations: Not on file    Relationship status: Not on file  . Intimate partner violence:    Fear of current or ex partner: Not on file    Emotionally abused: Not on file    Physically abused: Not on file    Forced sexual activity: Not on file  Other Topics Concern  . Not on file  Social History Narrative  . Not on file    History reviewed. No pertinent family history.  Allergies  Allergen Reactions  . Codeine     Medication list reviewed and updated in full in [facility].   GEN: No acute illnesses, no fevers, chills. GI: No n/v/d, eating normally Pulm: No SOB Interactive and getting along well at home.  Otherwise, ROS is as per the HPI.  Objective:   BP 104/70   Pulse 75   Temp 98.1 F (36.7 C) (Oral)   Ht 5' 7.25" (1.708 m)   Wt 241 lb (109.3 kg)   BMI 37.47 kg/m   GEN: WDWN, NAD, Non-toxic, A & O x 3 HEENT: Atraumatic, Normocephalic. Neck supple. No masses, No LAD. Ears and Nose: No external deformity. CV: RRR, No M/G/R. No JVD. No thrill. No extra heart sounds. PULM: CTA B, no wheezes, crackles, rhonchi. No retractions. No resp. distress. No accessory muscle use. EXTR: No c/c/e NEURO Normal gait.  PSYCH: Normally interactive. Conversant. Not depressed or anxious appearing.  Calm demeanor.   Laboratory and Imaging Data:  Assessment and Plan:   Type 2 diabetes mellitus with peripheral neuropathy ([place]) - Plan: POCT glucose (manual entry), Hemoglobin A1c  Need for prophylactic vaccination and inoculation against influenza - Plan: Flu Vaccine QUAD 6+ mos PF IM (Fluarix Quad PF)  Hyperlipidemia with target LDL less than 70 - Plan: Hepatic function panel, Lipid panel  Check labs -  adjust meds if needed  Follow-up: Return in about 6 months (around 08/25/2018) for Complete Physical.  Orders Placed This Encounter  Procedures  . Flu Vaccine QUAD 6+ mos PF IM (Fluarix Quad PF)  . Hemoglobin A1c  . Hepatic function panel  . Lipid panel  . POCT glucose (manual entry)    Signed,  Fed Ceci T. Carvel Huskins, MD   Allergies as of 02/24/2018      Reactions   Codeine       Medication List  Accurate as of 02/24/18  8:31 AM. Always use your most recent med list.          APPLE CIDER VINEGAR PO Take 2 capsules by mouth daily.   BAYER CONTOUR NEXT MONITOR w/Device Kit 1 each by Does not apply route daily.   BAYER MICROLET LANCETS lancets Use to check blood sugars once daily. E11.42   DYMISTA 137-50 MCG/ACT Susp Generic drug:  Azelastine-Fluticasone INHALE 1 SPRAY IN EACH NOSTRIL 2 TIMES DAILY   EPINEPHrine 0.3 mg/0.3 mL Soaj injection Commonly known as:  EPI-PEN See admin instructions. for allergic reaction   Fish Oil 1000 MG Caps Take 1 capsule by mouth daily.   gabapentin 300 MG capsule Commonly known as:  NEURONTIN TAKE ONE CAPSULE BY MOUTH TWICE DAILY   glipiZIDE 5 MG tablet Commonly known as:  GLUCOTROL TAKE 1 TABLET (5 MG TOTAL) BY MOUTH 2 (TWO) TIMES DAILY BEFORE A MEAL.   glucose blood test strip Use to check blood sugars once daily.  Dx: E11.42   levocetirizine 5 MG tablet Commonly known as:  XYZAL every evening.   meloxicam 15 MG tablet Commonly known as:  MOBIC TAKE 1 TABLET BY MOUTH ONCE DAILY   metFORMIN 1000 MG tablet Commonly known as:  GLUCOPHAGE Take 1 tablet (1,000 mg total) by mouth 2 (two) times daily with a meal.   montelukast 10 MG tablet Commonly known as:  SINGULAIR Take 10 mg by mouth daily.   OVER THE COUNTER MEDICATION Beet root daily   OVER THE COUNTER MEDICATION daily. Cinnamon   OVER THE COUNTER MEDICATION Tumeric daily   pioglitazone 45 MG tablet Commonly known as:  ACTOS Take 1 tablet (45 mg  total) by mouth daily.   pravastatin 20 MG tablet Commonly known as:  PRAVACHOL Take 1 tablet (20 mg total) by mouth daily.   TART CHERRY ADVANCED Caps Take 2 capsules by mouth daily.   VENTOLIN HFA 108 (90 Base) MCG/ACT inhaler Generic drug:  albuterol INHALE 2 PUFFS INTO THE LUNGS EVERY 4 HOURS AS NEEDED FOR WHEEZING OR SHORTNESS OF BREATH

## 2018-03-09 DIAGNOSIS — J301 Allergic rhinitis due to pollen: Secondary | ICD-10-CM | POA: Diagnosis not present

## 2018-03-09 DIAGNOSIS — J3089 Other allergic rhinitis: Secondary | ICD-10-CM | POA: Diagnosis not present

## 2018-03-11 DIAGNOSIS — J3089 Other allergic rhinitis: Secondary | ICD-10-CM | POA: Diagnosis not present

## 2018-03-11 DIAGNOSIS — J301 Allergic rhinitis due to pollen: Secondary | ICD-10-CM | POA: Diagnosis not present

## 2018-03-16 DIAGNOSIS — J301 Allergic rhinitis due to pollen: Secondary | ICD-10-CM | POA: Diagnosis not present

## 2018-03-16 DIAGNOSIS — J3089 Other allergic rhinitis: Secondary | ICD-10-CM | POA: Diagnosis not present

## 2018-03-18 DIAGNOSIS — J3089 Other allergic rhinitis: Secondary | ICD-10-CM | POA: Diagnosis not present

## 2018-03-18 DIAGNOSIS — J301 Allergic rhinitis due to pollen: Secondary | ICD-10-CM | POA: Diagnosis not present

## 2018-03-23 DIAGNOSIS — J3089 Other allergic rhinitis: Secondary | ICD-10-CM | POA: Diagnosis not present

## 2018-03-23 DIAGNOSIS — J301 Allergic rhinitis due to pollen: Secondary | ICD-10-CM | POA: Diagnosis not present

## 2018-03-25 DIAGNOSIS — J3089 Other allergic rhinitis: Secondary | ICD-10-CM | POA: Diagnosis not present

## 2018-03-25 DIAGNOSIS — J301 Allergic rhinitis due to pollen: Secondary | ICD-10-CM | POA: Diagnosis not present

## 2018-03-27 ENCOUNTER — Other Ambulatory Visit: Payer: Self-pay | Admitting: Family Medicine

## 2018-03-29 NOTE — Telephone Encounter (Signed)
Last office visit 02/24/2018 for DM.  Last refilled 03/12/2017 for #180 with 3 refills.  Ok to refill?

## 2018-03-30 DIAGNOSIS — J301 Allergic rhinitis due to pollen: Secondary | ICD-10-CM | POA: Diagnosis not present

## 2018-03-30 DIAGNOSIS — J3089 Other allergic rhinitis: Secondary | ICD-10-CM | POA: Diagnosis not present

## 2018-04-06 DIAGNOSIS — J3089 Other allergic rhinitis: Secondary | ICD-10-CM | POA: Diagnosis not present

## 2018-04-06 DIAGNOSIS — J301 Allergic rhinitis due to pollen: Secondary | ICD-10-CM | POA: Diagnosis not present

## 2018-04-08 DIAGNOSIS — J301 Allergic rhinitis due to pollen: Secondary | ICD-10-CM | POA: Diagnosis not present

## 2018-04-08 DIAGNOSIS — J3089 Other allergic rhinitis: Secondary | ICD-10-CM | POA: Diagnosis not present

## 2018-04-15 DIAGNOSIS — J3089 Other allergic rhinitis: Secondary | ICD-10-CM | POA: Diagnosis not present

## 2018-04-15 DIAGNOSIS — J301 Allergic rhinitis due to pollen: Secondary | ICD-10-CM | POA: Diagnosis not present

## 2018-04-20 DIAGNOSIS — J3089 Other allergic rhinitis: Secondary | ICD-10-CM | POA: Diagnosis not present

## 2018-04-20 DIAGNOSIS — J301 Allergic rhinitis due to pollen: Secondary | ICD-10-CM | POA: Diagnosis not present

## 2018-04-27 DIAGNOSIS — J3089 Other allergic rhinitis: Secondary | ICD-10-CM | POA: Diagnosis not present

## 2018-04-27 DIAGNOSIS — J301 Allergic rhinitis due to pollen: Secondary | ICD-10-CM | POA: Diagnosis not present

## 2018-05-05 DIAGNOSIS — J3089 Other allergic rhinitis: Secondary | ICD-10-CM | POA: Diagnosis not present

## 2018-05-05 DIAGNOSIS — J301 Allergic rhinitis due to pollen: Secondary | ICD-10-CM | POA: Diagnosis not present

## 2018-05-11 DIAGNOSIS — J3089 Other allergic rhinitis: Secondary | ICD-10-CM | POA: Diagnosis not present

## 2018-05-11 DIAGNOSIS — J301 Allergic rhinitis due to pollen: Secondary | ICD-10-CM | POA: Diagnosis not present

## 2018-05-13 DIAGNOSIS — J301 Allergic rhinitis due to pollen: Secondary | ICD-10-CM | POA: Diagnosis not present

## 2018-05-13 DIAGNOSIS — J3089 Other allergic rhinitis: Secondary | ICD-10-CM | POA: Diagnosis not present

## 2018-05-18 DIAGNOSIS — J301 Allergic rhinitis due to pollen: Secondary | ICD-10-CM | POA: Diagnosis not present

## 2018-05-18 DIAGNOSIS — J3089 Other allergic rhinitis: Secondary | ICD-10-CM | POA: Diagnosis not present

## 2018-05-20 DIAGNOSIS — J3089 Other allergic rhinitis: Secondary | ICD-10-CM | POA: Diagnosis not present

## 2018-05-20 DIAGNOSIS — J301 Allergic rhinitis due to pollen: Secondary | ICD-10-CM | POA: Diagnosis not present

## 2018-05-27 DIAGNOSIS — J3089 Other allergic rhinitis: Secondary | ICD-10-CM | POA: Diagnosis not present

## 2018-05-27 DIAGNOSIS — J301 Allergic rhinitis due to pollen: Secondary | ICD-10-CM | POA: Diagnosis not present

## 2018-06-01 DIAGNOSIS — J301 Allergic rhinitis due to pollen: Secondary | ICD-10-CM | POA: Diagnosis not present

## 2018-06-01 DIAGNOSIS — J3089 Other allergic rhinitis: Secondary | ICD-10-CM | POA: Diagnosis not present

## 2018-06-03 DIAGNOSIS — J3089 Other allergic rhinitis: Secondary | ICD-10-CM | POA: Diagnosis not present

## 2018-06-03 DIAGNOSIS — J301 Allergic rhinitis due to pollen: Secondary | ICD-10-CM | POA: Diagnosis not present

## 2018-06-08 DIAGNOSIS — J301 Allergic rhinitis due to pollen: Secondary | ICD-10-CM | POA: Diagnosis not present

## 2018-06-08 DIAGNOSIS — J3089 Other allergic rhinitis: Secondary | ICD-10-CM | POA: Diagnosis not present

## 2018-06-10 DIAGNOSIS — J3089 Other allergic rhinitis: Secondary | ICD-10-CM | POA: Diagnosis not present

## 2018-06-10 DIAGNOSIS — J301 Allergic rhinitis due to pollen: Secondary | ICD-10-CM | POA: Diagnosis not present

## 2018-06-15 DIAGNOSIS — J301 Allergic rhinitis due to pollen: Secondary | ICD-10-CM | POA: Diagnosis not present

## 2018-06-15 DIAGNOSIS — J3089 Other allergic rhinitis: Secondary | ICD-10-CM | POA: Diagnosis not present

## 2018-06-17 DIAGNOSIS — J3089 Other allergic rhinitis: Secondary | ICD-10-CM | POA: Diagnosis not present

## 2018-06-17 DIAGNOSIS — J301 Allergic rhinitis due to pollen: Secondary | ICD-10-CM | POA: Diagnosis not present

## 2018-06-22 DIAGNOSIS — J301 Allergic rhinitis due to pollen: Secondary | ICD-10-CM | POA: Diagnosis not present

## 2018-06-22 DIAGNOSIS — J3089 Other allergic rhinitis: Secondary | ICD-10-CM | POA: Diagnosis not present

## 2018-06-23 ENCOUNTER — Other Ambulatory Visit: Payer: Self-pay | Admitting: Family Medicine

## 2018-06-29 DIAGNOSIS — J3089 Other allergic rhinitis: Secondary | ICD-10-CM | POA: Diagnosis not present

## 2018-06-29 DIAGNOSIS — J301 Allergic rhinitis due to pollen: Secondary | ICD-10-CM | POA: Diagnosis not present

## 2018-07-01 ENCOUNTER — Other Ambulatory Visit: Payer: Self-pay | Admitting: Family Medicine

## 2018-07-01 NOTE — Telephone Encounter (Signed)
Last office visit 02/24/2018 for DM.  Last refilled 01/04/2018 for #90 with 1 refill.  CPE scheduled for 08/30/2018.

## 2018-07-06 DIAGNOSIS — J3089 Other allergic rhinitis: Secondary | ICD-10-CM | POA: Diagnosis not present

## 2018-07-06 DIAGNOSIS — J301 Allergic rhinitis due to pollen: Secondary | ICD-10-CM | POA: Diagnosis not present

## 2018-07-09 DIAGNOSIS — J301 Allergic rhinitis due to pollen: Secondary | ICD-10-CM | POA: Diagnosis not present

## 2018-07-12 DIAGNOSIS — J3089 Other allergic rhinitis: Secondary | ICD-10-CM | POA: Diagnosis not present

## 2018-07-13 DIAGNOSIS — J301 Allergic rhinitis due to pollen: Secondary | ICD-10-CM | POA: Diagnosis not present

## 2018-07-13 DIAGNOSIS — J3089 Other allergic rhinitis: Secondary | ICD-10-CM | POA: Diagnosis not present

## 2018-07-20 DIAGNOSIS — J3089 Other allergic rhinitis: Secondary | ICD-10-CM | POA: Diagnosis not present

## 2018-07-20 DIAGNOSIS — J301 Allergic rhinitis due to pollen: Secondary | ICD-10-CM | POA: Diagnosis not present

## 2018-07-27 DIAGNOSIS — J3089 Other allergic rhinitis: Secondary | ICD-10-CM | POA: Diagnosis not present

## 2018-07-27 DIAGNOSIS — J301 Allergic rhinitis due to pollen: Secondary | ICD-10-CM | POA: Diagnosis not present

## 2018-08-03 DIAGNOSIS — J3089 Other allergic rhinitis: Secondary | ICD-10-CM | POA: Diagnosis not present

## 2018-08-03 DIAGNOSIS — J301 Allergic rhinitis due to pollen: Secondary | ICD-10-CM | POA: Diagnosis not present

## 2018-08-10 DIAGNOSIS — J301 Allergic rhinitis due to pollen: Secondary | ICD-10-CM | POA: Diagnosis not present

## 2018-08-10 DIAGNOSIS — J3089 Other allergic rhinitis: Secondary | ICD-10-CM | POA: Diagnosis not present

## 2018-08-12 ENCOUNTER — Other Ambulatory Visit: Payer: Self-pay | Admitting: *Deleted

## 2018-08-12 DIAGNOSIS — J3089 Other allergic rhinitis: Secondary | ICD-10-CM | POA: Diagnosis not present

## 2018-08-12 DIAGNOSIS — J301 Allergic rhinitis due to pollen: Secondary | ICD-10-CM | POA: Diagnosis not present

## 2018-08-12 MED ORDER — GLIPIZIDE 5 MG PO TABS: 5.0000 mg | ORAL_TABLET | Freq: Two times a day (BID) | ORAL | 0 refills | Status: DC

## 2018-08-17 DIAGNOSIS — J301 Allergic rhinitis due to pollen: Secondary | ICD-10-CM | POA: Diagnosis not present

## 2018-08-17 DIAGNOSIS — J3089 Other allergic rhinitis: Secondary | ICD-10-CM | POA: Diagnosis not present

## 2018-08-19 ENCOUNTER — Other Ambulatory Visit: Payer: Self-pay | Admitting: Family Medicine

## 2018-08-19 DIAGNOSIS — J3089 Other allergic rhinitis: Secondary | ICD-10-CM | POA: Diagnosis not present

## 2018-08-19 DIAGNOSIS — J301 Allergic rhinitis due to pollen: Secondary | ICD-10-CM | POA: Diagnosis not present

## 2018-08-19 DIAGNOSIS — E1142 Type 2 diabetes mellitus with diabetic polyneuropathy: Secondary | ICD-10-CM

## 2018-08-19 DIAGNOSIS — R739 Hyperglycemia, unspecified: Secondary | ICD-10-CM

## 2018-08-19 DIAGNOSIS — Z0000 Encounter for general adult medical examination without abnormal findings: Secondary | ICD-10-CM

## 2018-08-22 ENCOUNTER — Other Ambulatory Visit: Payer: Self-pay | Admitting: Family Medicine

## 2018-08-24 DIAGNOSIS — J3089 Other allergic rhinitis: Secondary | ICD-10-CM | POA: Diagnosis not present

## 2018-08-24 DIAGNOSIS — J301 Allergic rhinitis due to pollen: Secondary | ICD-10-CM | POA: Diagnosis not present

## 2018-08-26 DIAGNOSIS — J301 Allergic rhinitis due to pollen: Secondary | ICD-10-CM | POA: Diagnosis not present

## 2018-08-27 ENCOUNTER — Other Ambulatory Visit: Payer: BLUE CROSS/BLUE SHIELD

## 2018-08-30 ENCOUNTER — Encounter: Payer: BLUE CROSS/BLUE SHIELD | Admitting: Family Medicine

## 2018-09-02 DIAGNOSIS — J301 Allergic rhinitis due to pollen: Secondary | ICD-10-CM | POA: Diagnosis not present

## 2018-09-02 DIAGNOSIS — J3089 Other allergic rhinitis: Secondary | ICD-10-CM | POA: Diagnosis not present

## 2018-09-09 DIAGNOSIS — J301 Allergic rhinitis due to pollen: Secondary | ICD-10-CM | POA: Diagnosis not present

## 2018-09-09 DIAGNOSIS — J3089 Other allergic rhinitis: Secondary | ICD-10-CM | POA: Diagnosis not present

## 2018-09-12 ENCOUNTER — Other Ambulatory Visit: Payer: Self-pay | Admitting: Family Medicine

## 2018-09-14 DIAGNOSIS — J301 Allergic rhinitis due to pollen: Secondary | ICD-10-CM | POA: Diagnosis not present

## 2018-09-14 DIAGNOSIS — J3089 Other allergic rhinitis: Secondary | ICD-10-CM | POA: Diagnosis not present

## 2018-09-23 ENCOUNTER — Other Ambulatory Visit: Payer: Self-pay | Admitting: Family Medicine

## 2018-09-23 DIAGNOSIS — J301 Allergic rhinitis due to pollen: Secondary | ICD-10-CM | POA: Diagnosis not present

## 2018-09-23 DIAGNOSIS — J3089 Other allergic rhinitis: Secondary | ICD-10-CM | POA: Diagnosis not present

## 2018-09-23 NOTE — Telephone Encounter (Signed)
Last office visit 02/24/2018 for DM.  Last refilled 03/29/2018 for #180 with 1 refill.  CPE scheduled for 11/17/2018.

## 2018-09-28 DIAGNOSIS — J4520 Mild intermittent asthma, uncomplicated: Secondary | ICD-10-CM | POA: Diagnosis not present

## 2018-09-28 DIAGNOSIS — J3089 Other allergic rhinitis: Secondary | ICD-10-CM | POA: Diagnosis not present

## 2018-09-28 DIAGNOSIS — J301 Allergic rhinitis due to pollen: Secondary | ICD-10-CM | POA: Diagnosis not present

## 2018-10-05 DIAGNOSIS — J301 Allergic rhinitis due to pollen: Secondary | ICD-10-CM | POA: Diagnosis not present

## 2018-10-05 DIAGNOSIS — J3089 Other allergic rhinitis: Secondary | ICD-10-CM | POA: Diagnosis not present

## 2018-10-12 DIAGNOSIS — J301 Allergic rhinitis due to pollen: Secondary | ICD-10-CM | POA: Diagnosis not present

## 2018-10-12 DIAGNOSIS — J3089 Other allergic rhinitis: Secondary | ICD-10-CM | POA: Diagnosis not present

## 2018-10-21 DIAGNOSIS — J3089 Other allergic rhinitis: Secondary | ICD-10-CM | POA: Diagnosis not present

## 2018-10-21 DIAGNOSIS — J301 Allergic rhinitis due to pollen: Secondary | ICD-10-CM | POA: Diagnosis not present

## 2018-10-28 DIAGNOSIS — J3089 Other allergic rhinitis: Secondary | ICD-10-CM | POA: Diagnosis not present

## 2018-10-28 DIAGNOSIS — J301 Allergic rhinitis due to pollen: Secondary | ICD-10-CM | POA: Diagnosis not present

## 2018-11-02 ENCOUNTER — Other Ambulatory Visit: Payer: Self-pay | Admitting: Family Medicine

## 2018-11-02 DIAGNOSIS — J3089 Other allergic rhinitis: Secondary | ICD-10-CM | POA: Diagnosis not present

## 2018-11-02 DIAGNOSIS — J301 Allergic rhinitis due to pollen: Secondary | ICD-10-CM | POA: Diagnosis not present

## 2018-11-04 DIAGNOSIS — S83242D Other tear of medial meniscus, current injury, left knee, subsequent encounter: Secondary | ICD-10-CM | POA: Diagnosis not present

## 2018-11-09 DIAGNOSIS — J301 Allergic rhinitis due to pollen: Secondary | ICD-10-CM | POA: Diagnosis not present

## 2018-11-09 DIAGNOSIS — J3089 Other allergic rhinitis: Secondary | ICD-10-CM | POA: Diagnosis not present

## 2018-11-12 ENCOUNTER — Other Ambulatory Visit: Payer: Self-pay

## 2018-11-12 ENCOUNTER — Other Ambulatory Visit (INDEPENDENT_AMBULATORY_CARE_PROVIDER_SITE_OTHER): Payer: BC Managed Care – PPO

## 2018-11-12 DIAGNOSIS — Z0000 Encounter for general adult medical examination without abnormal findings: Secondary | ICD-10-CM

## 2018-11-12 DIAGNOSIS — E1142 Type 2 diabetes mellitus with diabetic polyneuropathy: Secondary | ICD-10-CM | POA: Diagnosis not present

## 2018-11-12 LAB — HEPATIC FUNCTION PANEL
ALT: 37 U/L (ref 0–53)
AST: 17 U/L (ref 0–37)
Albumin: 4.3 g/dL (ref 3.5–5.2)
Alkaline Phosphatase: 61 U/L (ref 39–117)
Bilirubin, Direct: 0.2 mg/dL (ref 0.0–0.3)
Total Bilirubin: 0.8 mg/dL (ref 0.2–1.2)
Total Protein: 6.3 g/dL (ref 6.0–8.3)

## 2018-11-12 LAB — CBC WITH DIFFERENTIAL/PLATELET
Basophils Absolute: 0 10*3/uL (ref 0.0–0.1)
Basophils Relative: 0.5 % (ref 0.0–3.0)
Eosinophils Absolute: 0.1 10*3/uL (ref 0.0–0.7)
Eosinophils Relative: 2 % (ref 0.0–5.0)
HCT: 47.2 % (ref 39.0–52.0)
Hemoglobin: 16.3 g/dL (ref 13.0–17.0)
Lymphocytes Relative: 28.8 % (ref 12.0–46.0)
Lymphs Abs: 1.2 10*3/uL (ref 0.7–4.0)
MCHC: 34.6 g/dL (ref 30.0–36.0)
MCV: 88.4 fl (ref 78.0–100.0)
Monocytes Absolute: 0.4 10*3/uL (ref 0.1–1.0)
Monocytes Relative: 8.3 % (ref 3.0–12.0)
Neutro Abs: 2.5 10*3/uL (ref 1.4–7.7)
Neutrophils Relative %: 60.4 % (ref 43.0–77.0)
Platelets: 190 10*3/uL (ref 150.0–400.0)
RBC: 5.34 Mil/uL (ref 4.22–5.81)
RDW: 12.6 % (ref 11.5–15.5)
WBC: 4.2 10*3/uL (ref 4.0–10.5)

## 2018-11-12 LAB — HEMOGLOBIN A1C: Hgb A1c MFr Bld: 7.9 % — ABNORMAL HIGH (ref 4.6–6.5)

## 2018-11-12 LAB — LIPID PANEL
Cholesterol: 163 mg/dL (ref 0–200)
HDL: 32.5 mg/dL — ABNORMAL LOW (ref >39.00)
LDL Cholesterol: 91 mg/dL (ref 0–99)
NonHDL: 130.03
Total CHOL/HDL Ratio: 5
Triglycerides: 197 mg/dL — ABNORMAL HIGH (ref 0.0–149.0)
VLDL: 39.4 mg/dL (ref 0.0–40.0)

## 2018-11-12 LAB — BASIC METABOLIC PANEL
BUN: 23 mg/dL (ref 6–23)
CO2: 28 mEq/L (ref 19–32)
Calcium: 9.2 mg/dL (ref 8.4–10.5)
Chloride: 100 mEq/L (ref 96–112)
Creatinine, Ser: 0.75 mg/dL (ref 0.40–1.50)
GFR: 113.94 mL/min (ref >60.00)
Glucose, Bld: 242 mg/dL — ABNORMAL HIGH (ref 70–99)
Potassium: 4.6 mEq/L (ref 3.5–5.1)
Sodium: 136 mEq/L (ref 135–145)

## 2018-11-12 LAB — MICROALBUMIN / CREATININE URINE RATIO
Creatinine,U: 73.4 mg/dL
Microalb Creat Ratio: 10.9 mg/g (ref 0.0–30.0)
Microalb, Ur: 8 mg/dL — ABNORMAL HIGH (ref 0.0–1.9)

## 2018-11-15 LAB — PSA, TOTAL AND FREE
PSA, % Free: 25 % (calc) — ABNORMAL LOW (ref >25)
PSA, Free: 0.2 ng/mL
PSA, Total: 0.8 ng/mL (ref <=4.0)

## 2018-11-16 ENCOUNTER — Encounter: Payer: Self-pay | Admitting: Family Medicine

## 2018-11-16 ENCOUNTER — Telehealth: Payer: Self-pay | Admitting: Family Medicine

## 2018-11-16 DIAGNOSIS — J3089 Other allergic rhinitis: Secondary | ICD-10-CM | POA: Diagnosis not present

## 2018-11-16 DIAGNOSIS — J301 Allergic rhinitis due to pollen: Secondary | ICD-10-CM | POA: Diagnosis not present

## 2018-11-16 MED ORDER — GLIPIZIDE 5 MG PO TABS: 5.0000 mg | ORAL_TABLET | Freq: Two times a day (BID) | ORAL | 0 refills | Status: DC

## 2018-11-16 NOTE — Telephone Encounter (Signed)
Look like all his medications have refills except for the glipizide.  Refill for 90 day supply of glipizide sent to [facility] in [place].

## 2018-11-16 NOTE — Telephone Encounter (Signed)
Spoke with pt  On screening for covid pt was positive for sore throat and sinus issues. I rescheduled his cpx to 8/13  Can you refill his meds until then  [facility] [place]

## 2018-11-17 ENCOUNTER — Encounter: Payer: BLUE CROSS/BLUE SHIELD | Admitting: Family Medicine

## 2018-11-23 DIAGNOSIS — J301 Allergic rhinitis due to pollen: Secondary | ICD-10-CM | POA: Diagnosis not present

## 2018-11-23 DIAGNOSIS — J3089 Other allergic rhinitis: Secondary | ICD-10-CM | POA: Diagnosis not present

## 2018-11-24 ENCOUNTER — Other Ambulatory Visit: Payer: Self-pay | Admitting: *Deleted

## 2018-11-24 DIAGNOSIS — E1142 Type 2 diabetes mellitus with diabetic polyneuropathy: Secondary | ICD-10-CM

## 2018-11-24 MED ORDER — DEXCOM G6 SENSOR MISC: 1.0000 | 11 refills | Status: DC

## 2018-11-24 MED ORDER — DEXCOM G6 RECEIVER DEVI: 1.0000 | Freq: Every day | 0 refills | Status: DC

## 2018-11-24 MED ORDER — DEXCOM G6 TRANSMITTER MISC: 1.0000 | 3 refills | Status: DC

## 2018-11-24 NOTE — Telephone Encounter (Signed)
I have already sent the prescriptions to his pharmacy.  Patient is aware.

## 2018-11-24 NOTE — Telephone Encounter (Signed)
Lonnie Penny,   Do you know how to order a Dexcom blood sugar meter for him?

## 2018-11-24 NOTE — Telephone Encounter (Signed)
Received fax from [facility] stating they received a request for Dexcom G6 Continuous Monitoring System for Lonnie Jones.  They sent directions and ask that we e-Prescribe Dexcom G6 directly to the patient's pharmacy.  Prescriptions for Dexcom G6 Receiver, Transmitter and Sensors [facility] [place] as instructed by fax.  August notified by telephone.

## 2018-11-26 DIAGNOSIS — M25562 Pain in left knee: Secondary | ICD-10-CM | POA: Diagnosis not present

## 2018-11-30 ENCOUNTER — Other Ambulatory Visit: Payer: Self-pay | Admitting: Orthopedic Surgery

## 2018-11-30 DIAGNOSIS — M25512 Pain in left shoulder: Secondary | ICD-10-CM

## 2018-11-30 DIAGNOSIS — J301 Allergic rhinitis due to pollen: Secondary | ICD-10-CM | POA: Diagnosis not present

## 2018-11-30 DIAGNOSIS — J3089 Other allergic rhinitis: Secondary | ICD-10-CM | POA: Diagnosis not present

## 2018-12-07 DIAGNOSIS — J3089 Other allergic rhinitis: Secondary | ICD-10-CM | POA: Diagnosis not present

## 2018-12-07 DIAGNOSIS — J301 Allergic rhinitis due to pollen: Secondary | ICD-10-CM | POA: Diagnosis not present

## 2018-12-08 DIAGNOSIS — J3089 Other allergic rhinitis: Secondary | ICD-10-CM | POA: Diagnosis not present

## 2018-12-09 ENCOUNTER — Encounter: Payer: Self-pay | Admitting: Family Medicine

## 2018-12-14 DIAGNOSIS — J301 Allergic rhinitis due to pollen: Secondary | ICD-10-CM | POA: Diagnosis not present

## 2018-12-14 DIAGNOSIS — J3089 Other allergic rhinitis: Secondary | ICD-10-CM | POA: Diagnosis not present

## 2018-12-15 ENCOUNTER — Encounter: Payer: Self-pay | Admitting: Family Medicine

## 2018-12-15 ENCOUNTER — Other Ambulatory Visit: Payer: Self-pay | Admitting: Family Medicine

## 2018-12-15 MED ORDER — FREESTYLE LIBRE 14 DAY SENSOR MISC: 1.0000 | 5 refills | Status: DC

## 2018-12-15 MED ORDER — FREESTYLE LIBRE 14 DAY READER DEVI: 1.0000 | 0 refills | Status: DC

## 2018-12-15 NOTE — Telephone Encounter (Signed)
See note from pharmacy. Please confirm directions, refills and quantity.

## 2018-12-21 DIAGNOSIS — J3089 Other allergic rhinitis: Secondary | ICD-10-CM | POA: Diagnosis not present

## 2018-12-21 DIAGNOSIS — J301 Allergic rhinitis due to pollen: Secondary | ICD-10-CM | POA: Diagnosis not present

## 2018-12-23 ENCOUNTER — Other Ambulatory Visit: Payer: Self-pay | Admitting: Orthopedic Surgery

## 2018-12-25 ENCOUNTER — Other Ambulatory Visit: Payer: Self-pay

## 2018-12-25 ENCOUNTER — Ambulatory Visit
Admission: RE | Admit: 2018-12-25 | Discharge: 2018-12-25 | Disposition: A | Discharge status: Home / Self Care | Payer: BC Managed Care – PPO | Source: Ambulatory Visit | Attending: Orthopedic Surgery | Admitting: Orthopedic Surgery

## 2018-12-25 DIAGNOSIS — M25512 Pain in left shoulder: Secondary | ICD-10-CM

## 2018-12-25 DIAGNOSIS — M75122 Complete rotator cuff tear or rupture of left shoulder, not specified as traumatic: Secondary | ICD-10-CM | POA: Diagnosis not present

## 2018-12-28 DIAGNOSIS — M25512 Pain in left shoulder: Secondary | ICD-10-CM | POA: Diagnosis not present

## 2018-12-28 DIAGNOSIS — J301 Allergic rhinitis due to pollen: Secondary | ICD-10-CM | POA: Diagnosis not present

## 2018-12-28 DIAGNOSIS — J3089 Other allergic rhinitis: Secondary | ICD-10-CM | POA: Diagnosis not present

## 2018-12-30 ENCOUNTER — Other Ambulatory Visit: Payer: Self-pay | Admitting: Family Medicine

## 2018-12-30 DIAGNOSIS — J301 Allergic rhinitis due to pollen: Secondary | ICD-10-CM | POA: Diagnosis not present

## 2018-12-30 DIAGNOSIS — J3089 Other allergic rhinitis: Secondary | ICD-10-CM | POA: Diagnosis not present

## 2018-12-30 NOTE — Telephone Encounter (Signed)
Last office visit 02/24/2018 for DM.  Last refilled 07/01/2018 for #90 with 1 refill.  CPE scheduled for 01/13/2019.

## 2019-01-04 ENCOUNTER — Other Ambulatory Visit: Payer: Self-pay

## 2019-01-04 ENCOUNTER — Encounter: Payer: Self-pay | Admitting: Family Medicine

## 2019-01-04 ENCOUNTER — Ambulatory Visit (INDEPENDENT_AMBULATORY_CARE_PROVIDER_SITE_OTHER): Payer: BC Managed Care – PPO | Admitting: Family Medicine

## 2019-01-04 VITALS — BP 110/66 | HR 79 | Temp 98.0°F | Ht 67.25 in | Wt 248.0 lb

## 2019-01-04 DIAGNOSIS — J301 Allergic rhinitis due to pollen: Secondary | ICD-10-CM | POA: Diagnosis not present

## 2019-01-04 DIAGNOSIS — M545 Low back pain, unspecified: Secondary | ICD-10-CM

## 2019-01-04 DIAGNOSIS — J3089 Other allergic rhinitis: Secondary | ICD-10-CM | POA: Diagnosis not present

## 2019-01-04 LAB — POC URINALSYSI DIPSTICK (AUTOMATED)
Bilirubin, UA: NEGATIVE
Blood, UA: NEGATIVE
Glucose, UA: NEGATIVE
Ketones, UA: NEGATIVE
Leukocytes, UA: NEGATIVE
Nitrite, UA: NEGATIVE
Protein, UA: NEGATIVE
Spec Grav, UA: 1.02 (ref 1.010–1.025)
Urobilinogen, UA: 0.2 E.U./dL
pH, UA: 6 (ref 5.0–8.0)

## 2019-01-04 MED ORDER — CYCLOBENZAPRINE HCL 10 MG PO TABS: 5.0000 mg | ORAL_TABLET | Freq: Every evening | ORAL | 0 refills | Status: DC | PRN

## 2019-01-04 NOTE — Patient Instructions (Signed)
Treat with heat, home physical therapy,  muscle relaxant prn and restart anti-inflammatory like meloxicam or ibuproifen after shoulder surgery.

## 2019-01-04 NOTE — Progress Notes (Signed)
Chief Complaint  Patient presents with  . Back Pain    History of Present Illness: HPI  43 year old male pt of Dr. Lillie Fragmin with history of DM presents with new onset pain in low back. Hx of periphearl neuropathy on gabapentin He reports  In last 1 week bilateral low back pain.    Pain on 7/10 pain scale.. worse with leaning back, no pain with lying flat. No issue sleep. No radiation of pain.    No fall/no injury. Tylenol helps a little with pain. He does have chronic knee and shoulder pain Using meloxicam.. has to stop  Tomorrow given shoulder surgery next week.  No dysuria. No frequency, no incontinence.   No numbness or weakness in legs.    Lab Results  Component Value Date   HGBA1C 7.9 (H) 11/12/2018   Urinalysis    Component Value Date/Time   BILIRUBINUR Negative 01/04/2019 1439   PROTEINUR Negative 01/04/2019 1439   UROBILINOGEN 0.2 01/04/2019 1439   NITRITE Negative 01/04/2019 1439   LEUKOCYTESUR Negative 01/04/2019 1439       COVID 19 screen No recent travel or known exposure to COVID19 The patient denies respiratory symptoms of COVID 19 at this time.  The importance of social distancing was discussed today.   Review of Systems  Constitutional: Negative for chills and fever.  HENT: Negative for congestion and ear pain.   Eyes: Negative for pain and redness.  Respiratory: Negative for cough and shortness of breath.   Cardiovascular: Negative for chest pain, palpitations and leg swelling.  Gastrointestinal: Negative for abdominal pain, blood in stool, constipation, diarrhea, nausea and vomiting.  Genitourinary: Negative for dysuria.  Musculoskeletal: Negative for falls and myalgias.  Skin: Negative for rash.  Neurological: Negative for dizziness.  Psychiatric/Behavioral: Negative for depression. The patient is not nervous/anxious.       Past Medical History:  Diagnosis Date  . Acute idiopathic gout involving toe 06/22/2017  . Allergic rhinitis due  to allergen 08/25/2011  . Allergy   . Asthma   . Diabetes mellitus   . GERD (gastroesophageal reflux disease)   . Hyperlipidemia LDL goal < 70 12/16/2010  . Migraine headache   . Type 2 diabetes mellitus with peripheral neuropathy ([place]) 08/08/2010   Qualifier: Diagnosis of  By: [place] MD, Frederico Hamman      reports that he quit smoking about 17 years ago. His smokeless tobacco use includes snuff. He reports that he does not drink alcohol or use drugs.   Current Outpatient Medications:  .  azelastine (ASTELIN) 0.1 % nasal spray, INHALE 2 SPRAYS IN EACH NOSTRIL TWICE A DAY, Disp: , Rfl:  .  BAYER MICROLET LANCETS lancets, Use to check blood sugars once daily. E11.42, Disp: 100 each, Rfl: 3 .  Blood Glucose Monitoring Suppl (BAYER CONTOUR NEXT MONITOR) w/Device KIT, 1 each by Does not apply route daily., Disp: 1 kit, Rfl: 0 .  Continuous Blood Gluc Receiver (FREESTYLE LIBRE 14 DAY READER) DEVI, Place 1 Device onto the skin every 14 (fourteen) days for 14 days. Use device with sensor, Disp: 1 Device, Rfl: 0 .  Continuous Blood Gluc Sensor (FREESTYLE LIBRE 14 DAY SENSOR) MISC, Place 1 patch onto the skin every 14 (fourteen) days., Disp: 2 each, Rfl: 5 .  EPINEPHrine 0.3 mg/0.3 mL IJ SOAJ injection, See admin instructions. for allergic reaction, Disp: , Rfl: 1 .  fluticasone (FLONASE) 50 MCG/ACT nasal spray, SPRAY 1 SPRAY INTO EACH NOSTRIL EVERY DAY, Disp: , Rfl:  .  gabapentin (  NEURONTIN) 300 MG capsule, TAKE 1 CAPSULE BY MOUTH TWICE A DAY, Disp: 180 capsule, Rfl: 1 .  glipiZIDE (GLUCOTROL) 5 MG tablet, Take 1 tablet (5 mg total) by mouth 2 (two) times daily before a meal., Disp: 180 tablet, Rfl: 0 .  glucose blood test strip, USE TO CHECK BLOOD SUGARS ONCE DAILY. DX: E11.42, Disp: 100 each, Rfl: 3 .  levocetirizine (XYZAL) 5 MG tablet, every evening., Disp: , Rfl: 5 .  meloxicam (MOBIC) 15 MG tablet, TAKE 1 TABLET BY MOUTH EVERY DAY, Disp: 90 tablet, Rfl: 1 .  metFORMIN (GLUCOPHAGE) 1000 MG tablet, TAKE  1 TABLET (1,000 MG TOTAL) BY MOUTH 2 (TWO) TIMES DAILY WITH A MEAL., Disp: 180 tablet, Rfl: 1 .  montelukast (SINGULAIR) 10 MG tablet, Take 10 mg by mouth daily., Disp: , Rfl: 5 .  pioglitazone (ACTOS) 45 MG tablet, TAKE 1 TABLET (45 MG TOTAL) BY MOUTH DAILY., Disp: 90 tablet, Rfl: 0 .  pravastatin (PRAVACHOL) 20 MG tablet, TAKE 1 TABLET BY MOUTH EVERY DAY, Disp: 90 tablet, Rfl: 0 .  VENTOLIN HFA 108 (90 Base) MCG/ACT inhaler, INHALE 2 PUFFS INTO THE LUNGS EVERY 4 HOURS AS NEEDED FOR WHEEZING OR SHORTNESS OF BREATH, Disp: 18 Inhaler, Rfl: 2   Observations/Objective: Blood pressure 110/66, pulse 79, temperature 98 F (36.7 C), temperature source Temporal, height 5' 7.25" (1.708 m), weight 248 lb (112.5 kg), SpO2 95 %.  Physical Exam Musculoskeletal:     Lumbar back: He exhibits decreased range of motion and tenderness. He exhibits no bony tenderness.       Back:     Comments: Bilateral low back ttp over paraspinous muscles  neg faber's neg SLR bialterally  Neurological:     General: No focal deficit present.     Cranial Nerves: Cranial nerves are intact.     Sensory: Sensation is intact.     Motor: Motor function is intact.     Coordination: Coordination is intact.     Gait: Gait is intact.      Assessment and Plan      Lonnie Lofts, MD

## 2019-01-04 NOTE — Assessment & Plan Note (Signed)
Without sciatica.  ? Walking differently with chronic knee issues.  No sign of radiculopathy.  Treat with heat, home PT ( info given) muscle relaxant prn and restart NSADIS after shoulder surgery.

## 2019-01-06 DIAGNOSIS — J301 Allergic rhinitis due to pollen: Secondary | ICD-10-CM | POA: Diagnosis not present

## 2019-01-06 DIAGNOSIS — J3089 Other allergic rhinitis: Secondary | ICD-10-CM | POA: Diagnosis not present

## 2019-01-11 DIAGNOSIS — J301 Allergic rhinitis due to pollen: Secondary | ICD-10-CM | POA: Diagnosis not present

## 2019-01-11 DIAGNOSIS — J3089 Other allergic rhinitis: Secondary | ICD-10-CM | POA: Diagnosis not present

## 2019-01-12 DIAGNOSIS — Y999 Unspecified external cause status: Secondary | ICD-10-CM | POA: Diagnosis not present

## 2019-01-12 DIAGNOSIS — M7552 Bursitis of left shoulder: Secondary | ICD-10-CM | POA: Diagnosis not present

## 2019-01-12 DIAGNOSIS — M24112 Other articular cartilage disorders, left shoulder: Secondary | ICD-10-CM | POA: Diagnosis not present

## 2019-01-12 DIAGNOSIS — M75122 Complete rotator cuff tear or rupture of left shoulder, not specified as traumatic: Secondary | ICD-10-CM | POA: Diagnosis not present

## 2019-01-12 DIAGNOSIS — M7522 Bicipital tendinitis, left shoulder: Secondary | ICD-10-CM | POA: Diagnosis not present

## 2019-01-12 DIAGNOSIS — X58XXXA Exposure to other specified factors, initial encounter: Secondary | ICD-10-CM | POA: Diagnosis not present

## 2019-01-12 DIAGNOSIS — S43432A Superior glenoid labrum lesion of left shoulder, initial encounter: Secondary | ICD-10-CM | POA: Diagnosis not present

## 2019-01-12 DIAGNOSIS — G8918 Other acute postprocedural pain: Secondary | ICD-10-CM | POA: Diagnosis not present

## 2019-01-13 ENCOUNTER — Encounter: Payer: BC Managed Care – PPO | Admitting: Family Medicine

## 2019-01-18 DIAGNOSIS — J3089 Other allergic rhinitis: Secondary | ICD-10-CM | POA: Diagnosis not present

## 2019-01-18 DIAGNOSIS — J301 Allergic rhinitis due to pollen: Secondary | ICD-10-CM | POA: Diagnosis not present

## 2019-01-19 ENCOUNTER — Ambulatory Visit (INDEPENDENT_AMBULATORY_CARE_PROVIDER_SITE_OTHER): Payer: BC Managed Care – PPO | Admitting: Family Medicine

## 2019-01-19 ENCOUNTER — Encounter: Payer: Self-pay | Admitting: Family Medicine

## 2019-01-19 ENCOUNTER — Other Ambulatory Visit: Payer: Self-pay

## 2019-01-19 VITALS — BP 130/80 | HR 77 | Temp 97.4°F | Ht 67.5 in | Wt 244.5 lb

## 2019-01-19 DIAGNOSIS — Z0000 Encounter for general adult medical examination without abnormal findings: Secondary | ICD-10-CM | POA: Diagnosis not present

## 2019-01-19 MED ORDER — ESZOPICLONE 1 MG PO TABS: 1.0000 mg | ORAL_TABLET | Freq: Every evening | ORAL | 1 refills | Status: DC | PRN

## 2019-01-19 NOTE — Progress Notes (Signed)
Lonnie Dragos T. Levester Waldridge, MD Primary Care and [place] at [facility] [place], [address] Phone: [phone]  FAX: [place] - 43 y.o. male  MRN [id number]  Date of Birth: 30-Dec-1975  Visit Date: 01/19/2019  PCP: Owens Loffler, MD  Referred by: Owens Loffler, MD  Chief Complaint  Patient presents with  . Annual Exam   Patient Care Team: Owens Loffler, MD as PCP - General (Family Medicine) Subjective:   Lonnie Jones is a 43 y.o. pleasant patient who presents with the following:  Preventative Health Maintenance Visit:  Health Maintenance Summary Reviewed and updated, unless pt declines services.  Tobacco History Reviewed. Alcohol: No concerns, no excessive use Exercise Habits: Some activity, rec at least 30 mins 5 times a week STD concerns: no risk or activity to increase risk Drug Use: None Encouraged self-testicular check  Sleep?  Diabetes Mellitus: Tolerating Medications: yes Compliance with diet: fair, Body mass index is 37.73 kg/m. Exercise: minimal / intermittent Avg blood sugars at home: improved, 120 Foot problems: none Hypoglycemia: none No nausea, vomitting, blurred vision, polyuria.  Lab Results  Component Value Date   HGBA1C 7.9 (H) 11/12/2018   HGBA1C 6.9 (H) 02/24/2018   HGBA1C 8.5 (H) 08/19/2017   Lab Results  Component Value Date   MICROALBUR 8.0 (H) 11/12/2018   LDLCALC 91 11/12/2018   CREATININE 0.75 11/12/2018    Wt Readings from Last 3 Encounters:  01/19/19 244 lb 8 oz (110.9 kg)  01/04/19 248 lb (112.5 kg)  02/24/18 241 lb (109.3 kg)     Health Maintenance  Topic Date Due  . FOOT EXAM  08/20/2017  . OPHTHALMOLOGY EXAM  11/18/2018  . INFLUENZA VACCINE  01/01/2019  . HEMOGLOBIN A1C  05/14/2019  . URINE MICROALBUMIN  11/12/2019  . TETANUS/TDAP  07/28/2022  . PNEUMOCOCCAL POLYSACCHARIDE VACCINE AGE 55-64 HIGH RISK  Completed  . HIV Screening   Completed   Immunization History  Administered Date(s) Administered  . Influenza, Seasonal, Injecte, Preservative Fre 07/28/2012  . Influenza,inj,Quad PF,6+ Mos 01/26/2013, 02/15/2015, 05/12/2016, 02/24/2018  . Pneumococcal Polysaccharide-23 07/28/2012, 08/24/2017  . Tdap 07/28/2012   Patient Active Problem List   Diagnosis Date Noted  . Acute idiopathic gout involving toe 06/22/2017  . Peripheral autonomic neuropathy due to diabetes mellitus ([place]) 02/16/2015  . Allergic rhinitis due to allergen 08/25/2011  . Hyperlipidemia with target LDL less than 70 12/16/2010  . Type 2 diabetes mellitus with peripheral neuropathy ([place]) 08/08/2010  . MIGRAINE HEADACHE 08/08/2010  . ASTHMA 08/08/2010  . GERD 08/08/2010  . Low back pain 08/08/2010   Past Medical History:  Diagnosis Date  . Acute idiopathic gout involving toe 06/22/2017  . Allergic rhinitis due to allergen 08/25/2011  . Allergy   . Asthma   . Diabetes mellitus   . GERD (gastroesophageal reflux disease)   . Hyperlipidemia LDL goal < 70 12/16/2010  . Migraine headache   . Type 2 diabetes mellitus with peripheral neuropathy ([place]) 08/08/2010   Qualifier: Diagnosis of  By: Lorelei Pont MD, Frederico Hamman     Past Surgical History:  Procedure Laterality Date  . APPENDECTOMY  2000  . CARPAL TUNNEL RELEASE    . KNEE SURGERY  2011   Social History   Socioeconomic History  . Marital status: Married    Spouse name: Not on file  . Number of children: 2  . Years of education: Not on file  . Highest education level: Not on  file  Occupational History    Comment: Sewer  Social Needs  . Financial resource strain: Not on file  . Food insecurity    Worry: Not on file    Inability: Not on file  . Transportation needs    Medical: Not on file    Non-medical: Not on file  Tobacco Use  . Smoking status: Former Smoker    Quit date: 08/19/2001    Years since quitting: 17.4  . Smokeless tobacco: Current User    Types: Snuff  Substance and Sexual  Activity  . Alcohol use: No    Alcohol/week: 0.0 standard drinks  . Drug use: No  . Sexual activity: Not on file  Lifestyle  . Physical activity    Days per week: Not on file    Minutes per session: Not on file  . Stress: Not on file  Relationships  . Social Herbalist on phone: Not on file    Gets together: Not on file    Attends religious service: Not on file    Active member of club or organization: Not on file    Attends meetings of clubs or organizations: Not on file    Relationship status: Not on file  . Intimate partner violence    Fear of current or ex partner: Not on file    Emotionally abused: Not on file    Physically abused: Not on file    Forced sexual activity: Not on file  Other Topics Concern  . Not on file  Social History Narrative  . Not on file   History reviewed. No pertinent family history. Allergies  Allergen Reactions  . Codeine     Medication list has been reviewed and updated.   General: Denies fever, chills, sweats. No significant weight loss. Eyes: Denies blurring,significant itching ENT: Denies earache, sore throat, and hoarseness. Cardiovascular: Denies chest pains, palpitations, dyspnea on exertion Respiratory: Denies cough, dyspnea at rest,wheeezing Breast: no concerns about lumps GI: Denies nausea, vomiting, diarrhea, constipation, change in bowel habits, abdominal pain, melena, hematochezia GU: Denies penile discharge, ED, urinary flow / outflow problems. No STD concerns. Musculoskeletal: recent RTC repair  Derm: Denies rash, itching Neuro: Denies  paresthesias, frequent falls, frequent headaches Psych: Denies depression, anxiety Endocrine: Denies cold intolerance, heat intolerance, polydipsia Heme: Denies enlarged lymph nodes Allergy: No hayfever  Objective:   BP 130/80   Pulse 77   Temp (!) 97.4 F (36.3 C) (Temporal)   Ht 5' 7.5" (1.715 m)   Wt 244 lb 8 oz (110.9 kg)   SpO2 94%   BMI 37.73 kg/m  Ideal Body  Weight: Weight in (lb) to have BMI = 25: 161.7  Ideal Body Weight: Weight in (lb) to have BMI = 25: 161.7 No exam data present Depression screen [facility] 2/9 01/19/2019 08/24/2017 12/04/2016  Decreased Interest 0 0 0  Down, Depressed, Hopeless 0 0 0  PHQ - 2 Score 0 0 0     GEN: well developed, well nourished, no acute distress Eyes: conjunctiva and lids normal, PERRLA, EOMI ENT: TM clear, nares clear, oral exam WNL Neck: supple, no lymphadenopathy, no thyromegaly, no JVD Pulm: clear to auscultation and percussion, respiratory effort normal CV: regular rate and rhythm, S1-S2, no murmur, rub or gallop, no bruits, peripheral pulses normal and symmetric, no cyanosis, clubbing, edema or varicosities GI: soft, non-tender; no hepatosplenomegaly, masses; active bowel sounds all quadrants GU: no hernia, testicular mass, penile discharge Lymph: no cervical, axillary or inguinal adenopathy MSK:  sling SKIN: clear, good turgor, color WNL, no rashes, lesions, or ulcerations Neuro: normal mental status, normal strength, sensation, and motion Psych: alert; oriented to person, place and time, normally interactive and not anxious or depressed in appearance. All labs reviewed with patient. Results for orders placed or performed in visit on 01/04/19  POCT Urinalysis Dipstick (Automated)  Result Value Ref Range   Color, UA Yellow    Clarity, UA Clear    Glucose, UA Negative Negative   Bilirubin, UA Negative    Ketones, UA Negative    Spec Grav, UA 1.020 1.010 - 1.025   Blood, UA Negative    pH, UA 6.0 5.0 - 8.0   Protein, UA Negative Negative   Urobilinogen, UA 0.2 0.2 or 1.0 E.U./dL   Nitrite, UA Negative    Leukocytes, UA Negative Negative    Assessment and Plan:     ICD-10-CM   1. Healthcare maintenance  Z00.00    He has been eating poorly Sleep meds to help during RTC repair recovery - if it doesn't help then a higher dose is reasonable  Health Maintenance Exam: The patient's preventative  maintenance and recommended screening tests for an annual wellness exam were reviewed in full today. Brought up to date unless services declined.  Counselled on the importance of diet, exercise, and its role in overall health and mortality. The patient's FH and SH was reviewed, including their home life, tobacco status, and drug and alcohol status.  Follow-up in 1 year for physical exam or additional follow-up below.  Follow-up: Return in about 6 months (around 07/22/2019) for diabetes follow-up. Or follow-up in 1 year if not noted.  Meds ordered this encounter  Medications  . eszopiclone (LUNESTA) 1 MG TABS tablet    Sig: Take 1 tablet (1 mg total) by mouth at bedtime as needed for sleep. Take immediately before bedtime    Dispense:  30 tablet    Refill:  1   Medications Discontinued During This Encounter  Medication Reason  . cyclobenzaprine (FLEXERIL) 10 MG tablet Change in therapy   No orders of the defined types were placed in this encounter.   Signed,  Maud Deed. Rudean Icenhour, MD   Allergies as of 01/19/2019      Reactions   Codeine       Medication List       Accurate as of January 19, 2019 11:59 PM. If you have any questions, ask your nurse or doctor.        STOP taking these medications   cyclobenzaprine 10 MG tablet Commonly known as: FLEXERIL Stopped by: Owens Loffler, MD     TAKE these medications   azelastine 0.1 % nasal spray Commonly known as: ASTELIN INHALE 2 SPRAYS IN EACH NOSTRIL TWICE A DAY   Bayer Contour Next Monitor w/Device Kit 1 each by Does not apply route daily.   Bayer Microlet Lancets lancets Use to check blood sugars once daily. E11.42   EPINEPHrine 0.3 mg/0.3 mL Soaj injection Commonly known as: EPI-PEN See admin instructions. for allergic reaction   eszopiclone 1 MG Tabs tablet Commonly known as: LUNESTA Take 1 tablet (1 mg total) by mouth at bedtime as needed for sleep. Take immediately before bedtime Started by: Owens Loffler, MD   fluticasone 50 MCG/ACT nasal spray Commonly known as: FLONASE SPRAY 1 SPRAY INTO EACH NOSTRIL EVERY DAY   FreeStyle Libre 14 Day Reader Devi Place 1 Device onto the skin every 14 (fourteen) days for 14 days. Use device  with sensor   FreeStyle Libre 14 Day Sensor Misc Place 1 patch onto the skin every 14 (fourteen) days.   gabapentin 300 MG capsule Commonly known as: NEURONTIN TAKE 1 CAPSULE BY MOUTH TWICE A DAY   glipiZIDE 5 MG tablet Commonly known as: GLUCOTROL Take 1 tablet (5 mg total) by mouth 2 (two) times daily before a meal.   glucose blood test strip USE TO CHECK BLOOD SUGARS ONCE DAILY. DX: E11.42   levocetirizine 5 MG tablet Commonly known as: XYZAL every evening.   meloxicam 15 MG tablet Commonly known as: MOBIC TAKE 1 TABLET BY MOUTH EVERY DAY   metFORMIN 1000 MG tablet Commonly known as: GLUCOPHAGE TAKE 1 TABLET (1,000 MG TOTAL) BY MOUTH 2 (TWO) TIMES DAILY WITH A MEAL.   methocarbamol 750 MG tablet Commonly known as: ROBAXIN TAKE 1 TABLET BY MOUTH EVERY SIX TO EIGHT HOURS AS NEEDED FOR SPASM   montelukast 10 MG tablet Commonly known as: SINGULAIR Take 10 mg by mouth daily.   pioglitazone 45 MG tablet Commonly known as: ACTOS TAKE 1 TABLET (45 MG TOTAL) BY MOUTH DAILY.   pravastatin 20 MG tablet Commonly known as: PRAVACHOL TAKE 1 TABLET BY MOUTH EVERY DAY   Ventolin HFA 108 (90 Base) MCG/ACT inhaler Generic drug: albuterol INHALE 2 PUFFS INTO THE LUNGS EVERY 4 HOURS AS NEEDED FOR WHEEZING OR SHORTNESS OF BREATH

## 2019-01-20 DIAGNOSIS — M24112 Other articular cartilage disorders, left shoulder: Secondary | ICD-10-CM | POA: Diagnosis not present

## 2019-01-24 DIAGNOSIS — M6281 Muscle weakness (generalized): Secondary | ICD-10-CM | POA: Diagnosis not present

## 2019-01-24 DIAGNOSIS — M25612 Stiffness of left shoulder, not elsewhere classified: Secondary | ICD-10-CM | POA: Diagnosis not present

## 2019-01-24 DIAGNOSIS — M24112 Other articular cartilage disorders, left shoulder: Secondary | ICD-10-CM | POA: Diagnosis not present

## 2019-01-24 DIAGNOSIS — M75122 Complete rotator cuff tear or rupture of left shoulder, not specified as traumatic: Secondary | ICD-10-CM | POA: Diagnosis not present

## 2019-01-25 DIAGNOSIS — J301 Allergic rhinitis due to pollen: Secondary | ICD-10-CM | POA: Diagnosis not present

## 2019-01-25 DIAGNOSIS — J3089 Other allergic rhinitis: Secondary | ICD-10-CM | POA: Diagnosis not present

## 2019-01-26 ENCOUNTER — Other Ambulatory Visit: Payer: Self-pay | Admitting: Family Medicine

## 2019-01-27 DIAGNOSIS — M25512 Pain in left shoulder: Secondary | ICD-10-CM | POA: Diagnosis not present

## 2019-01-27 DIAGNOSIS — M25612 Stiffness of left shoulder, not elsewhere classified: Secondary | ICD-10-CM | POA: Diagnosis not present

## 2019-01-27 DIAGNOSIS — M75122 Complete rotator cuff tear or rupture of left shoulder, not specified as traumatic: Secondary | ICD-10-CM | POA: Diagnosis not present

## 2019-01-27 DIAGNOSIS — M6281 Muscle weakness (generalized): Secondary | ICD-10-CM | POA: Diagnosis not present

## 2019-01-31 DIAGNOSIS — M75122 Complete rotator cuff tear or rupture of left shoulder, not specified as traumatic: Secondary | ICD-10-CM | POA: Diagnosis not present

## 2019-01-31 DIAGNOSIS — M25612 Stiffness of left shoulder, not elsewhere classified: Secondary | ICD-10-CM | POA: Diagnosis not present

## 2019-01-31 DIAGNOSIS — M6281 Muscle weakness (generalized): Secondary | ICD-10-CM | POA: Diagnosis not present

## 2019-01-31 DIAGNOSIS — M25512 Pain in left shoulder: Secondary | ICD-10-CM | POA: Diagnosis not present

## 2019-02-01 DIAGNOSIS — J3089 Other allergic rhinitis: Secondary | ICD-10-CM | POA: Diagnosis not present

## 2019-02-01 DIAGNOSIS — J301 Allergic rhinitis due to pollen: Secondary | ICD-10-CM | POA: Diagnosis not present

## 2019-02-03 DIAGNOSIS — M25512 Pain in left shoulder: Secondary | ICD-10-CM | POA: Diagnosis not present

## 2019-02-03 DIAGNOSIS — M6281 Muscle weakness (generalized): Secondary | ICD-10-CM | POA: Diagnosis not present

## 2019-02-03 DIAGNOSIS — M75122 Complete rotator cuff tear or rupture of left shoulder, not specified as traumatic: Secondary | ICD-10-CM | POA: Diagnosis not present

## 2019-02-03 DIAGNOSIS — M25612 Stiffness of left shoulder, not elsewhere classified: Secondary | ICD-10-CM | POA: Diagnosis not present

## 2019-02-08 ENCOUNTER — Encounter: Payer: Self-pay | Admitting: Family Medicine

## 2019-02-08 DIAGNOSIS — J3089 Other allergic rhinitis: Secondary | ICD-10-CM | POA: Diagnosis not present

## 2019-02-08 DIAGNOSIS — J301 Allergic rhinitis due to pollen: Secondary | ICD-10-CM | POA: Diagnosis not present

## 2019-02-08 DIAGNOSIS — E119 Type 2 diabetes mellitus without complications: Secondary | ICD-10-CM | POA: Diagnosis not present

## 2019-02-11 ENCOUNTER — Ambulatory Visit (INDEPENDENT_AMBULATORY_CARE_PROVIDER_SITE_OTHER): Payer: BC Managed Care – PPO

## 2019-02-11 ENCOUNTER — Other Ambulatory Visit: Payer: Self-pay

## 2019-02-11 DIAGNOSIS — M6281 Muscle weakness (generalized): Secondary | ICD-10-CM | POA: Diagnosis not present

## 2019-02-11 DIAGNOSIS — M25512 Pain in left shoulder: Secondary | ICD-10-CM | POA: Diagnosis not present

## 2019-02-11 DIAGNOSIS — M75122 Complete rotator cuff tear or rupture of left shoulder, not specified as traumatic: Secondary | ICD-10-CM | POA: Diagnosis not present

## 2019-02-11 DIAGNOSIS — M25612 Stiffness of left shoulder, not elsewhere classified: Secondary | ICD-10-CM | POA: Diagnosis not present

## 2019-02-11 DIAGNOSIS — Z23 Encounter for immunization: Secondary | ICD-10-CM | POA: Diagnosis not present

## 2019-02-14 DIAGNOSIS — M25512 Pain in left shoulder: Secondary | ICD-10-CM | POA: Diagnosis not present

## 2019-02-14 DIAGNOSIS — M25612 Stiffness of left shoulder, not elsewhere classified: Secondary | ICD-10-CM | POA: Diagnosis not present

## 2019-02-14 DIAGNOSIS — M6281 Muscle weakness (generalized): Secondary | ICD-10-CM | POA: Diagnosis not present

## 2019-02-14 DIAGNOSIS — M75122 Complete rotator cuff tear or rupture of left shoulder, not specified as traumatic: Secondary | ICD-10-CM | POA: Diagnosis not present

## 2019-02-17 DIAGNOSIS — M6281 Muscle weakness (generalized): Secondary | ICD-10-CM | POA: Diagnosis not present

## 2019-02-17 DIAGNOSIS — M25512 Pain in left shoulder: Secondary | ICD-10-CM | POA: Diagnosis not present

## 2019-02-17 DIAGNOSIS — J3089 Other allergic rhinitis: Secondary | ICD-10-CM | POA: Diagnosis not present

## 2019-02-17 DIAGNOSIS — J301 Allergic rhinitis due to pollen: Secondary | ICD-10-CM | POA: Diagnosis not present

## 2019-02-17 DIAGNOSIS — M75122 Complete rotator cuff tear or rupture of left shoulder, not specified as traumatic: Secondary | ICD-10-CM | POA: Diagnosis not present

## 2019-02-17 DIAGNOSIS — M25612 Stiffness of left shoulder, not elsewhere classified: Secondary | ICD-10-CM | POA: Diagnosis not present

## 2019-02-19 ENCOUNTER — Other Ambulatory Visit: Payer: Self-pay | Admitting: Family Medicine

## 2019-02-21 DIAGNOSIS — M75122 Complete rotator cuff tear or rupture of left shoulder, not specified as traumatic: Secondary | ICD-10-CM | POA: Diagnosis not present

## 2019-02-21 DIAGNOSIS — M25512 Pain in left shoulder: Secondary | ICD-10-CM | POA: Diagnosis not present

## 2019-02-21 DIAGNOSIS — M25612 Stiffness of left shoulder, not elsewhere classified: Secondary | ICD-10-CM | POA: Diagnosis not present

## 2019-02-21 DIAGNOSIS — M6281 Muscle weakness (generalized): Secondary | ICD-10-CM | POA: Diagnosis not present

## 2019-02-22 DIAGNOSIS — J3089 Other allergic rhinitis: Secondary | ICD-10-CM | POA: Diagnosis not present

## 2019-02-22 DIAGNOSIS — M1712 Unilateral primary osteoarthritis, left knee: Secondary | ICD-10-CM | POA: Diagnosis not present

## 2019-02-22 DIAGNOSIS — J301 Allergic rhinitis due to pollen: Secondary | ICD-10-CM | POA: Diagnosis not present

## 2019-02-23 DIAGNOSIS — M6281 Muscle weakness (generalized): Secondary | ICD-10-CM | POA: Diagnosis not present

## 2019-02-23 DIAGNOSIS — M25612 Stiffness of left shoulder, not elsewhere classified: Secondary | ICD-10-CM | POA: Diagnosis not present

## 2019-02-23 DIAGNOSIS — M75122 Complete rotator cuff tear or rupture of left shoulder, not specified as traumatic: Secondary | ICD-10-CM | POA: Diagnosis not present

## 2019-02-23 DIAGNOSIS — M25512 Pain in left shoulder: Secondary | ICD-10-CM | POA: Diagnosis not present

## 2019-02-28 ENCOUNTER — Encounter: Payer: Self-pay | Admitting: Family Medicine

## 2019-02-28 DIAGNOSIS — M6281 Muscle weakness (generalized): Secondary | ICD-10-CM | POA: Diagnosis not present

## 2019-02-28 DIAGNOSIS — M75122 Complete rotator cuff tear or rupture of left shoulder, not specified as traumatic: Secondary | ICD-10-CM | POA: Diagnosis not present

## 2019-02-28 DIAGNOSIS — M25612 Stiffness of left shoulder, not elsewhere classified: Secondary | ICD-10-CM | POA: Diagnosis not present

## 2019-02-28 DIAGNOSIS — M25512 Pain in left shoulder: Secondary | ICD-10-CM | POA: Diagnosis not present

## 2019-03-01 DIAGNOSIS — J3089 Other allergic rhinitis: Secondary | ICD-10-CM | POA: Diagnosis not present

## 2019-03-01 DIAGNOSIS — J301 Allergic rhinitis due to pollen: Secondary | ICD-10-CM | POA: Diagnosis not present

## 2019-03-01 DIAGNOSIS — M1712 Unilateral primary osteoarthritis, left knee: Secondary | ICD-10-CM | POA: Diagnosis not present

## 2019-03-02 ENCOUNTER — Other Ambulatory Visit: Payer: Self-pay

## 2019-03-02 ENCOUNTER — Ambulatory Visit (INDEPENDENT_AMBULATORY_CARE_PROVIDER_SITE_OTHER): Payer: BC Managed Care – PPO | Admitting: Family Medicine

## 2019-03-02 ENCOUNTER — Other Ambulatory Visit: Payer: Self-pay | Admitting: Family Medicine

## 2019-03-02 ENCOUNTER — Encounter: Payer: Self-pay | Admitting: Family Medicine

## 2019-03-02 VITALS — BP 124/84 | HR 83 | Temp 98.1°F | Ht 67.5 in | Wt 253.0 lb

## 2019-03-02 DIAGNOSIS — R21 Rash and other nonspecific skin eruption: Secondary | ICD-10-CM | POA: Diagnosis not present

## 2019-03-02 MED ORDER — TRIAMCINOLONE ACETONIDE 0.1 % EX CREA: 1.0000 "application " | TOPICAL_CREAM | Freq: Two times a day (BID) | CUTANEOUS | 1 refills | Status: AC

## 2019-03-02 NOTE — Progress Notes (Signed)
Teosha Casso T. Valyncia Wiens, MD Primary Care and [place] at [facility] [place], [address] Phone: [phone]  FAX: [place] - 43 y.o. male  MRN [id number]  Date of Birth: 1975/06/12  Visit Date: 03/02/2019  PCP: Owens Loffler, MD  Referred by: Owens Loffler, MD  Chief Complaint  Patient presents with  . Red Spots on Bilateral Lower Legs   Subjective:   Lonnie Jones is a 43 y.o. very pleasant male patient who presents with the following:  Red spots on his lower legs. ? If related to DM  He has had some flat rash that is itchy, in his lower legs, and the lower half of his lower extremities bilaterally.  He is not had any new soaps, boots, or any other known exposures.  Past Medical History, Surgical History, Social History, Family History, Problem List, Medications, and Allergies have been reviewed and updated if relevant.  Patient Active Problem List   Diagnosis Date Noted  . Acute idiopathic gout involving toe 06/22/2017  . Peripheral autonomic neuropathy due to diabetes mellitus ([place]) 02/16/2015  . Allergic rhinitis due to allergen 08/25/2011  . Hyperlipidemia with target LDL less than 70 12/16/2010  . Type 2 diabetes mellitus with peripheral neuropathy ([place]) 08/08/2010  . MIGRAINE HEADACHE 08/08/2010  . ASTHMA 08/08/2010  . GERD 08/08/2010  . Low back pain 08/08/2010    Past Medical History:  Diagnosis Date  . Acute idiopathic gout involving toe 06/22/2017  . Allergic rhinitis due to allergen 08/25/2011  . Allergy   . Asthma   . Diabetes mellitus   . GERD (gastroesophageal reflux disease)   . Hyperlipidemia LDL goal < 70 12/16/2010  . Migraine headache   . Type 2 diabetes mellitus with peripheral neuropathy ([place]) 08/08/2010   Qualifier: Diagnosis of  By: Lorelei Pont MD, Frederico Hamman      Past Surgical History:  Procedure Laterality Date  . APPENDECTOMY  2000  . CARPAL TUNNEL RELEASE     . KNEE SURGERY  2011    Social History   Socioeconomic History  . Marital status: Married    Spouse name: Not on file  . Number of children: 2  . Years of education: Not on file  . Highest education level: Not on file  Occupational History    Comment: Sewer  Social Needs  . Financial resource strain: Not on file  . Food insecurity    Worry: Not on file    Inability: Not on file  . Transportation needs    Medical: Not on file    Non-medical: Not on file  Tobacco Use  . Smoking status: Former Smoker    Quit date: 08/19/2001    Years since quitting: 17.5  . Smokeless tobacco: Current User    Types: Snuff  Substance and Sexual Activity  . Alcohol use: No    Alcohol/week: 0.0 standard drinks  . Drug use: No  . Sexual activity: Not on file  Lifestyle  . Physical activity    Days per week: Not on file    Minutes per session: Not on file  . Stress: Not on file  Relationships  . Social Herbalist on phone: Not on file    Gets together: Not on file    Attends religious service: Not on file    Active member of club or organization: Not on file    Attends meetings of clubs or organizations: Not  on file    Relationship status: Not on file  . Intimate partner violence    Fear of current or ex partner: Not on file    Emotionally abused: Not on file    Physically abused: Not on file    Forced sexual activity: Not on file  Other Topics Concern  . Not on file  Social History Narrative  . Not on file    No family history on file.  Allergies  Allergen Reactions  . Codeine     Medication list reviewed and updated in full in [place].   GEN: No acute illnesses, no fevers, chills. GI: No n/v/d, eating normally Pulm: No SOB Interactive and getting along well at home.  Otherwise, ROS is as per the HPI.  Objective:   BP 124/84   Pulse 83   Temp 98.1 F (36.7 C) (Temporal)   Ht 5' 7.5" (1.715 m)   Wt 253 lb (114.8 kg)   SpO2 96%   BMI 39.04  kg/m   GEN: WDWN, NAD, Non-toxic, A & O x 3 HEENT: Atraumatic, Normocephalic. Neck supple. No masses, No LAD. Ears and Nose: No external deformity. EXTR: No c/c/e NEURO Normal gait.  PSYCH: Normally interactive. Conversant. Not depressed or anxious appearing.  Calm demeanor.   He does have a rash with some little dots that is honestly very difficult to see but I cannot appreciate them in the bottom half of his lower extremities.  Laboratory and Imaging Data:  Assessment and Plan:     ICD-10-CM   1. Rash and nonspecific skin eruption  R21    This does not appear to be fungal, some in a give him some topical Kenalog.  Use this twice daily.  If for some reason this does not improve after a week or so then I could give him some oral prednisone.  Follow-up: No follow-ups on file.  Meds ordered this encounter  Medications  . triamcinolone cream (KENALOG) 0.1 %    Sig: Apply 1 application topically 2 (two) times daily.    Dispense:  454 g    Refill:  1   No orders of the defined types were placed in this encounter.   Signed,  Maud Deed. Kayvon Mo, MD   Outpatient Encounter Medications as of 03/02/2019  Medication Sig  . azelastine (ASTELIN) 0.1 % nasal spray INHALE 2 SPRAYS IN EACH NOSTRIL TWICE A DAY  . BAYER MICROLET LANCETS lancets Use to check blood sugars once daily. E11.42  . Blood Glucose Monitoring Suppl (BAYER CONTOUR NEXT MONITOR) w/Device KIT 1 each by Does not apply route daily.  . Continuous Blood Gluc Receiver (FREESTYLE LIBRE 14 DAY READER) DEVI Place 1 Device onto the skin every 14 (fourteen) days for 14 days. Use device with sensor  . Continuous Blood Gluc Sensor (FREESTYLE LIBRE 14 DAY SENSOR) MISC Place 1 patch onto the skin every 14 (fourteen) days.  Marland Kitchen EPINEPHrine 0.3 mg/0.3 mL IJ SOAJ injection See admin instructions. for allergic reaction  . eszopiclone (LUNESTA) 1 MG TABS tablet Take 1 tablet (1 mg total) by mouth at bedtime as needed for sleep. Take  immediately before bedtime  . fluticasone (FLONASE) 50 MCG/ACT nasal spray SPRAY 1 SPRAY INTO EACH NOSTRIL EVERY DAY  . gabapentin (NEURONTIN) 300 MG capsule TAKE 1 CAPSULE BY MOUTH TWICE A DAY  . glipiZIDE (GLUCOTROL) 5 MG tablet TAKE 1 TABLET (5 MG TOTAL) BY MOUTH 2 (TWO) TIMES DAILY BEFORE A MEAL.  Marland Kitchen glucose blood test strip  USE TO CHECK BLOOD SUGARS ONCE DAILY. DX: E11.42  . levocetirizine (XYZAL) 5 MG tablet every evening.  . meloxicam (MOBIC) 15 MG tablet TAKE 1 TABLET BY MOUTH EVERY DAY  . metFORMIN (GLUCOPHAGE) 1000 MG tablet TAKE 1 TABLET (1,000 MG TOTAL) BY MOUTH 2 (TWO) TIMES DAILY WITH A MEAL.  . methocarbamol (ROBAXIN) 750 MG tablet TAKE 1 TABLET BY MOUTH EVERY SIX TO EIGHT HOURS AS NEEDED FOR SPASM  . montelukast (SINGULAIR) 10 MG tablet Take 10 mg by mouth daily.  . pioglitazone (ACTOS) 45 MG tablet TAKE 1 TABLET BY MOUTH EVERY DAY  . pravastatin (PRAVACHOL) 20 MG tablet TAKE 1 TABLET BY MOUTH EVERY DAY  . VENTOLIN HFA 108 (90 Base) MCG/ACT inhaler INHALE 2 PUFFS INTO THE LUNGS EVERY 4 HOURS AS NEEDED FOR WHEEZING OR SHORTNESS OF BREATH  . triamcinolone cream (KENALOG) 0.1 % Apply 1 application topically 2 (two) times daily.   No facility-administered encounter medications on file as of 03/02/2019.

## 2019-03-03 ENCOUNTER — Encounter: Payer: Self-pay | Admitting: Family Medicine

## 2019-03-03 DIAGNOSIS — M75122 Complete rotator cuff tear or rupture of left shoulder, not specified as traumatic: Secondary | ICD-10-CM | POA: Diagnosis not present

## 2019-03-03 DIAGNOSIS — M25512 Pain in left shoulder: Secondary | ICD-10-CM | POA: Diagnosis not present

## 2019-03-03 DIAGNOSIS — M6281 Muscle weakness (generalized): Secondary | ICD-10-CM | POA: Diagnosis not present

## 2019-03-03 DIAGNOSIS — M25612 Stiffness of left shoulder, not elsewhere classified: Secondary | ICD-10-CM | POA: Diagnosis not present

## 2019-03-07 DIAGNOSIS — M25612 Stiffness of left shoulder, not elsewhere classified: Secondary | ICD-10-CM | POA: Diagnosis not present

## 2019-03-07 DIAGNOSIS — M75122 Complete rotator cuff tear or rupture of left shoulder, not specified as traumatic: Secondary | ICD-10-CM | POA: Diagnosis not present

## 2019-03-07 DIAGNOSIS — M6281 Muscle weakness (generalized): Secondary | ICD-10-CM | POA: Diagnosis not present

## 2019-03-07 DIAGNOSIS — M25512 Pain in left shoulder: Secondary | ICD-10-CM | POA: Diagnosis not present

## 2019-03-08 ENCOUNTER — Telehealth: Payer: Self-pay

## 2019-03-08 DIAGNOSIS — M1712 Unilateral primary osteoarthritis, left knee: Secondary | ICD-10-CM | POA: Diagnosis not present

## 2019-03-08 DIAGNOSIS — J301 Allergic rhinitis due to pollen: Secondary | ICD-10-CM | POA: Diagnosis not present

## 2019-03-08 DIAGNOSIS — J3089 Other allergic rhinitis: Secondary | ICD-10-CM | POA: Diagnosis not present

## 2019-03-08 NOTE — Telephone Encounter (Signed)
Dolly with [facility] left v/m the [employer] G 6 sensor & transmitter with other accessories with it are not going thru and requiring a PA. Richmond Campbell was trying to send as prior Josem Kaufmann but would not go thru and request cb for additional details.

## 2019-03-08 NOTE — Telephone Encounter (Signed)
PA completed and faxed to [facility] at [phone].

## 2019-03-08 NOTE — Telephone Encounter (Signed)
Spoke with [place].  [employer] requires PA for Sensor, Microbiologist.  PA forms requested from [facility].

## 2019-03-09 NOTE — Telephone Encounter (Signed)
PA denied.  [employer]ex[employer] G6 is only covered when a member uses insulin to manage their diabetes.  Left message for Lonnie Jones letting him know.

## 2019-03-10 DIAGNOSIS — M6281 Muscle weakness (generalized): Secondary | ICD-10-CM | POA: Diagnosis not present

## 2019-03-10 DIAGNOSIS — M75122 Complete rotator cuff tear or rupture of left shoulder, not specified as traumatic: Secondary | ICD-10-CM | POA: Diagnosis not present

## 2019-03-10 DIAGNOSIS — M25612 Stiffness of left shoulder, not elsewhere classified: Secondary | ICD-10-CM | POA: Diagnosis not present

## 2019-03-10 DIAGNOSIS — M25512 Pain in left shoulder: Secondary | ICD-10-CM | POA: Diagnosis not present

## 2019-03-14 DIAGNOSIS — M6281 Muscle weakness (generalized): Secondary | ICD-10-CM | POA: Diagnosis not present

## 2019-03-14 DIAGNOSIS — M25512 Pain in left shoulder: Secondary | ICD-10-CM | POA: Diagnosis not present

## 2019-03-14 DIAGNOSIS — M75122 Complete rotator cuff tear or rupture of left shoulder, not specified as traumatic: Secondary | ICD-10-CM | POA: Diagnosis not present

## 2019-03-14 DIAGNOSIS — M25612 Stiffness of left shoulder, not elsewhere classified: Secondary | ICD-10-CM | POA: Diagnosis not present

## 2019-03-15 DIAGNOSIS — J3089 Other allergic rhinitis: Secondary | ICD-10-CM | POA: Diagnosis not present

## 2019-03-15 DIAGNOSIS — J301 Allergic rhinitis due to pollen: Secondary | ICD-10-CM | POA: Diagnosis not present

## 2019-03-16 DIAGNOSIS — M6281 Muscle weakness (generalized): Secondary | ICD-10-CM | POA: Diagnosis not present

## 2019-03-16 DIAGNOSIS — M75122 Complete rotator cuff tear or rupture of left shoulder, not specified as traumatic: Secondary | ICD-10-CM | POA: Diagnosis not present

## 2019-03-16 DIAGNOSIS — M25612 Stiffness of left shoulder, not elsewhere classified: Secondary | ICD-10-CM | POA: Diagnosis not present

## 2019-03-16 DIAGNOSIS — M25512 Pain in left shoulder: Secondary | ICD-10-CM | POA: Diagnosis not present

## 2019-03-22 DIAGNOSIS — J3089 Other allergic rhinitis: Secondary | ICD-10-CM | POA: Diagnosis not present

## 2019-03-22 DIAGNOSIS — J301 Allergic rhinitis due to pollen: Secondary | ICD-10-CM | POA: Diagnosis not present

## 2019-03-23 DIAGNOSIS — M75122 Complete rotator cuff tear or rupture of left shoulder, not specified as traumatic: Secondary | ICD-10-CM | POA: Diagnosis not present

## 2019-03-23 DIAGNOSIS — M25512 Pain in left shoulder: Secondary | ICD-10-CM | POA: Diagnosis not present

## 2019-03-23 DIAGNOSIS — M25612 Stiffness of left shoulder, not elsewhere classified: Secondary | ICD-10-CM | POA: Diagnosis not present

## 2019-03-23 DIAGNOSIS — M6281 Muscle weakness (generalized): Secondary | ICD-10-CM | POA: Diagnosis not present

## 2019-03-25 DIAGNOSIS — M6281 Muscle weakness (generalized): Secondary | ICD-10-CM | POA: Diagnosis not present

## 2019-03-25 DIAGNOSIS — M25512 Pain in left shoulder: Secondary | ICD-10-CM | POA: Diagnosis not present

## 2019-03-25 DIAGNOSIS — M75122 Complete rotator cuff tear or rupture of left shoulder, not specified as traumatic: Secondary | ICD-10-CM | POA: Diagnosis not present

## 2019-03-25 DIAGNOSIS — M25612 Stiffness of left shoulder, not elsewhere classified: Secondary | ICD-10-CM | POA: Diagnosis not present

## 2019-03-28 DIAGNOSIS — M75122 Complete rotator cuff tear or rupture of left shoulder, not specified as traumatic: Secondary | ICD-10-CM | POA: Diagnosis not present

## 2019-03-28 DIAGNOSIS — M25512 Pain in left shoulder: Secondary | ICD-10-CM | POA: Diagnosis not present

## 2019-03-28 DIAGNOSIS — M6281 Muscle weakness (generalized): Secondary | ICD-10-CM | POA: Diagnosis not present

## 2019-03-28 DIAGNOSIS — M25612 Stiffness of left shoulder, not elsewhere classified: Secondary | ICD-10-CM | POA: Diagnosis not present

## 2019-03-29 DIAGNOSIS — J301 Allergic rhinitis due to pollen: Secondary | ICD-10-CM | POA: Diagnosis not present

## 2019-03-29 DIAGNOSIS — J3089 Other allergic rhinitis: Secondary | ICD-10-CM | POA: Diagnosis not present

## 2019-03-30 DIAGNOSIS — M25512 Pain in left shoulder: Secondary | ICD-10-CM | POA: Diagnosis not present

## 2019-03-30 DIAGNOSIS — M75122 Complete rotator cuff tear or rupture of left shoulder, not specified as traumatic: Secondary | ICD-10-CM | POA: Diagnosis not present

## 2019-03-30 DIAGNOSIS — M25612 Stiffness of left shoulder, not elsewhere classified: Secondary | ICD-10-CM | POA: Diagnosis not present

## 2019-03-30 DIAGNOSIS — M6281 Muscle weakness (generalized): Secondary | ICD-10-CM | POA: Diagnosis not present

## 2019-04-04 DIAGNOSIS — M25612 Stiffness of left shoulder, not elsewhere classified: Secondary | ICD-10-CM | POA: Diagnosis not present

## 2019-04-04 DIAGNOSIS — M75122 Complete rotator cuff tear or rupture of left shoulder, not specified as traumatic: Secondary | ICD-10-CM | POA: Diagnosis not present

## 2019-04-04 DIAGNOSIS — M25512 Pain in left shoulder: Secondary | ICD-10-CM | POA: Diagnosis not present

## 2019-04-04 DIAGNOSIS — M6281 Muscle weakness (generalized): Secondary | ICD-10-CM | POA: Diagnosis not present

## 2019-04-05 DIAGNOSIS — J301 Allergic rhinitis due to pollen: Secondary | ICD-10-CM | POA: Diagnosis not present

## 2019-04-05 DIAGNOSIS — J3089 Other allergic rhinitis: Secondary | ICD-10-CM | POA: Diagnosis not present

## 2019-04-07 DIAGNOSIS — M25612 Stiffness of left shoulder, not elsewhere classified: Secondary | ICD-10-CM | POA: Diagnosis not present

## 2019-04-07 DIAGNOSIS — M25512 Pain in left shoulder: Secondary | ICD-10-CM | POA: Diagnosis not present

## 2019-04-07 DIAGNOSIS — M6281 Muscle weakness (generalized): Secondary | ICD-10-CM | POA: Diagnosis not present

## 2019-04-07 DIAGNOSIS — M75122 Complete rotator cuff tear or rupture of left shoulder, not specified as traumatic: Secondary | ICD-10-CM | POA: Diagnosis not present

## 2019-04-11 DIAGNOSIS — M6281 Muscle weakness (generalized): Secondary | ICD-10-CM | POA: Diagnosis not present

## 2019-04-11 DIAGNOSIS — M25512 Pain in left shoulder: Secondary | ICD-10-CM | POA: Diagnosis not present

## 2019-04-11 DIAGNOSIS — M25612 Stiffness of left shoulder, not elsewhere classified: Secondary | ICD-10-CM | POA: Diagnosis not present

## 2019-04-11 DIAGNOSIS — M75122 Complete rotator cuff tear or rupture of left shoulder, not specified as traumatic: Secondary | ICD-10-CM | POA: Diagnosis not present

## 2019-04-12 DIAGNOSIS — J301 Allergic rhinitis due to pollen: Secondary | ICD-10-CM | POA: Diagnosis not present

## 2019-04-12 DIAGNOSIS — J3089 Other allergic rhinitis: Secondary | ICD-10-CM | POA: Diagnosis not present

## 2019-04-13 DIAGNOSIS — M75122 Complete rotator cuff tear or rupture of left shoulder, not specified as traumatic: Secondary | ICD-10-CM | POA: Diagnosis not present

## 2019-04-13 DIAGNOSIS — M25512 Pain in left shoulder: Secondary | ICD-10-CM | POA: Diagnosis not present

## 2019-04-13 DIAGNOSIS — M6281 Muscle weakness (generalized): Secondary | ICD-10-CM | POA: Diagnosis not present

## 2019-04-13 DIAGNOSIS — M25612 Stiffness of left shoulder, not elsewhere classified: Secondary | ICD-10-CM | POA: Diagnosis not present

## 2019-04-14 DIAGNOSIS — M1712 Unilateral primary osteoarthritis, left knee: Secondary | ICD-10-CM | POA: Diagnosis not present

## 2019-04-18 ENCOUNTER — Other Ambulatory Visit: Payer: Self-pay | Admitting: Family Medicine

## 2019-04-18 DIAGNOSIS — M25612 Stiffness of left shoulder, not elsewhere classified: Secondary | ICD-10-CM | POA: Diagnosis not present

## 2019-04-18 DIAGNOSIS — M75122 Complete rotator cuff tear or rupture of left shoulder, not specified as traumatic: Secondary | ICD-10-CM | POA: Diagnosis not present

## 2019-04-18 DIAGNOSIS — M25512 Pain in left shoulder: Secondary | ICD-10-CM | POA: Diagnosis not present

## 2019-04-18 DIAGNOSIS — M6281 Muscle weakness (generalized): Secondary | ICD-10-CM | POA: Diagnosis not present

## 2019-04-18 NOTE — Telephone Encounter (Signed)
Last office visit 03/02/2019 for rash.  Last refilled 09/23/2018 for #180 with 1 refill.  Next Appt; 07/25/2019 for 6 month follow up.

## 2019-04-19 DIAGNOSIS — J3089 Other allergic rhinitis: Secondary | ICD-10-CM | POA: Diagnosis not present

## 2019-04-19 DIAGNOSIS — J301 Allergic rhinitis due to pollen: Secondary | ICD-10-CM | POA: Diagnosis not present

## 2019-04-21 DIAGNOSIS — M25512 Pain in left shoulder: Secondary | ICD-10-CM | POA: Diagnosis not present

## 2019-04-21 DIAGNOSIS — M25612 Stiffness of left shoulder, not elsewhere classified: Secondary | ICD-10-CM | POA: Diagnosis not present

## 2019-04-21 DIAGNOSIS — M75122 Complete rotator cuff tear or rupture of left shoulder, not specified as traumatic: Secondary | ICD-10-CM | POA: Diagnosis not present

## 2019-04-21 DIAGNOSIS — M6281 Muscle weakness (generalized): Secondary | ICD-10-CM | POA: Diagnosis not present

## 2019-05-02 ENCOUNTER — Encounter: Payer: Self-pay | Admitting: Family Medicine

## 2019-05-02 DIAGNOSIS — E1142 Type 2 diabetes mellitus with diabetic polyneuropathy: Secondary | ICD-10-CM

## 2019-05-03 ENCOUNTER — Encounter: Payer: Self-pay | Admitting: Family Medicine

## 2019-05-03 DIAGNOSIS — M25612 Stiffness of left shoulder, not elsewhere classified: Secondary | ICD-10-CM | POA: Diagnosis not present

## 2019-05-03 DIAGNOSIS — J301 Allergic rhinitis due to pollen: Secondary | ICD-10-CM | POA: Diagnosis not present

## 2019-05-03 DIAGNOSIS — J3089 Other allergic rhinitis: Secondary | ICD-10-CM | POA: Diagnosis not present

## 2019-05-03 DIAGNOSIS — M6281 Muscle weakness (generalized): Secondary | ICD-10-CM | POA: Diagnosis not present

## 2019-05-03 DIAGNOSIS — M25512 Pain in left shoulder: Secondary | ICD-10-CM | POA: Diagnosis not present

## 2019-05-03 DIAGNOSIS — M75122 Complete rotator cuff tear or rupture of left shoulder, not specified as traumatic: Secondary | ICD-10-CM | POA: Diagnosis not present

## 2019-05-03 NOTE — Telephone Encounter (Signed)
Last office visit 03/02/2019 for rash.  Last refilled 01/19/2019 for #30 with 1 refill.  Next Appt: 07/25/2019 for 6 month follow up.

## 2019-05-04 ENCOUNTER — Encounter: Payer: Self-pay | Admitting: Family Medicine

## 2019-05-04 MED ORDER — ESZOPICLONE 1 MG PO TABS: 1.0000 mg | ORAL_TABLET | Freq: Every evening | ORAL | 1 refills | Status: DC | PRN

## 2019-05-10 DIAGNOSIS — J301 Allergic rhinitis due to pollen: Secondary | ICD-10-CM | POA: Diagnosis not present

## 2019-05-10 DIAGNOSIS — M75122 Complete rotator cuff tear or rupture of left shoulder, not specified as traumatic: Secondary | ICD-10-CM | POA: Diagnosis not present

## 2019-05-10 DIAGNOSIS — M25612 Stiffness of left shoulder, not elsewhere classified: Secondary | ICD-10-CM | POA: Diagnosis not present

## 2019-05-10 DIAGNOSIS — J3089 Other allergic rhinitis: Secondary | ICD-10-CM | POA: Diagnosis not present

## 2019-05-10 DIAGNOSIS — M25512 Pain in left shoulder: Secondary | ICD-10-CM | POA: Diagnosis not present

## 2019-05-10 DIAGNOSIS — M6281 Muscle weakness (generalized): Secondary | ICD-10-CM | POA: Diagnosis not present

## 2019-05-17 DIAGNOSIS — J3089 Other allergic rhinitis: Secondary | ICD-10-CM | POA: Diagnosis not present

## 2019-05-17 DIAGNOSIS — J301 Allergic rhinitis due to pollen: Secondary | ICD-10-CM | POA: Diagnosis not present

## 2019-05-23 ENCOUNTER — Other Ambulatory Visit: Payer: Self-pay

## 2019-05-24 DIAGNOSIS — J3089 Other allergic rhinitis: Secondary | ICD-10-CM | POA: Diagnosis not present

## 2019-05-24 DIAGNOSIS — J301 Allergic rhinitis due to pollen: Secondary | ICD-10-CM | POA: Diagnosis not present

## 2019-05-25 ENCOUNTER — Other Ambulatory Visit: Payer: Self-pay

## 2019-05-25 ENCOUNTER — Ambulatory Visit (INDEPENDENT_AMBULATORY_CARE_PROVIDER_SITE_OTHER): Payer: BC Managed Care – PPO | Admitting: Internal Medicine

## 2019-05-25 ENCOUNTER — Encounter: Payer: Self-pay | Admitting: Internal Medicine

## 2019-05-25 VITALS — BP 124/78 | HR 74 | Temp 98.8°F | Ht 68.0 in | Wt 250.2 lb

## 2019-05-25 DIAGNOSIS — E1142 Type 2 diabetes mellitus with diabetic polyneuropathy: Secondary | ICD-10-CM | POA: Diagnosis not present

## 2019-05-25 DIAGNOSIS — E1165 Type 2 diabetes mellitus with hyperglycemia: Secondary | ICD-10-CM

## 2019-05-25 LAB — POCT GLYCOSYLATED HEMOGLOBIN (HGB A1C): Hemoglobin A1C: 7.5 % — AB (ref 4.0–5.6)

## 2019-05-25 MED ORDER — METFORMIN HCL 1000 MG PO TABS: 1000.0000 mg | ORAL_TABLET | Freq: Every day | ORAL | 3 refills | Status: DC

## 2019-05-25 MED ORDER — FARXIGA 5 MG PO TABS: 5.0000 mg | ORAL_TABLET | Freq: Every day | ORAL | 0 refills | Status: DC

## 2019-05-25 MED ORDER — FARXIGA 10 MG PO TABS: 10.0000 mg | ORAL_TABLET | Freq: Every day | ORAL | 6 refills | Status: DC

## 2019-05-25 NOTE — Patient Instructions (Addendum)
-   Decrease Metformin 100 mg to once daily  - Start Farxiga 5 mg before breakfast for 30 days, if no side effects , please pick up the 10 mg tablets - Continue Glipizide 5 mg Twice daily before meals - Continue Pioglitazone 45 mg daily         Choose healthy, lower carb lower calorie snacks: toss salad, cooked vegetables, cottage cheese, peanut butter, low fat cheese / string cheese, lower sodium deli meat, tuna salad or chicken salad     HOW TO TREAT LOW BLOOD SUGARS (Blood sugar LESS THAN 70 MG/DL)  Please follow the RULE OF 15 for the treatment of hypoglycemia treatment (when your (blood sugars are less than 70 mg/dL)    STEP 1: Take 15 grams of carbohydrates when your blood sugar is low, which includes:   3-4 GLUCOSE TABS  OR  3-4 OZ OF JUICE OR REGULAR SODA OR  ONE TUBE OF GLUCOSE GEL     STEP 2: RECHECK blood sugar in 15 MINUTES STEP 3: If your blood sugar is still low at the 15 minute recheck --> then, go back to STEP 1 and treat AGAIN with another 15 grams of carbohydrates.

## 2019-05-25 NOTE — Progress Notes (Signed)
Name: Lonnie Jones  MRN/ DOB: [id number], 1975-12-12   Age/ Sex: 43 y.o., male    PCP: Owens Loffler, MD   Reason for Endocrinology Evaluation: Type 2 Diabetes Mellitus     Date of Initial Endocrinology Visit: 05/25/2019     PATIENT IDENTIFIER: [name] is a 43 y.o. male with a past medical history of T2DM, Asthma, Dyslipidemia. The patient presented for initial endocrinology clinic visit on 05/25/2019 for consultative assistance with his diabetes management.    HPI: [name] was    Diagnosed with DM in 2016 Prior Medications tried/Intolerance: as listed  Currently checking blood sugars multiple times a day  Through the freestyle libre  Hypoglycemia episodes : no              Hemoglobin A1c has ranged from 6.9% in 2019, peaking at 8.5% in 07/2017. Patient required assistance for hypoglycemia:  Patient has required hospitalization within the last 1 year from hyper or hypoglycemia: no   In terms of diet, the patient avoids sugar sweetened beverages, eats 2 meals a day but downfall is snacking     Works in sewer and drain cleaning  Has abdominal aches and diarrhea that he attributes to diarrhea.   HOME DIABETES REGIMEN: Glipizide 5 mg BID Metformin 1000 mg BID  Pioglitazone 45 mg daily    Statin: Yes ACE-I/ARB: yes Prior Diabetic Education: no    CGM : unable to download   DIABETIC COMPLICATIONS: Microvascular complications:   Neuropathy  Denies: CKD, retinopathy   Last eye exam: Completed 05/2019  Macrovascular complications:   Denies: CAD, PVD, CVA   PAST HISTORY: Past Medical History:  Past Medical History:  Diagnosis Date  . Acute idiopathic gout involving toe 06/22/2017  . Allergic rhinitis due to allergen 08/25/2011  . Allergy   . Asthma   . Diabetes mellitus   . GERD (gastroesophageal reflux disease)   . Hyperlipidemia LDL goal < 70 12/16/2010  . Migraine headache   . Type 2 diabetes mellitus with peripheral neuropathy ([place])  08/08/2010   Qualifier: Diagnosis of  By: Lorelei Pont MD, Frederico Hamman     Past Surgical History:  Past Surgical History:  Procedure Laterality Date  . APPENDECTOMY  2000  . CARPAL TUNNEL RELEASE    . KNEE SURGERY  2011      Social History:  reports that he quit smoking about 17 years ago. His smokeless tobacco use includes snuff. He reports that he does not drink alcohol or use drugs.  Family History: No family history on file.   HOME MEDICATIONS: Allergies as of 05/25/2019      Reactions   Codeine       Medication List       Accurate as of May 25, 2019  9:51 AM. If you have any questions, ask your nurse or doctor.        azelastine 0.1 % nasal spray Commonly known as: ASTELIN INHALE 2 SPRAYS IN EACH NOSTRIL TWICE A DAY   Bayer Contour Next Monitor w/Device Kit 1 each by Does not apply route daily.   Bayer Microlet Lancets lancets Use to check blood sugars once daily. E11.42   EPINEPHrine 0.3 mg/0.3 mL Soaj injection Commonly known as: EPI-PEN See admin instructions. for allergic reaction   eszopiclone 1 MG Tabs tablet Commonly known as: LUNESTA Take 1 tablet (1 mg total) by mouth at bedtime as needed for sleep. Take immediately before bedtime   fluticasone 50 MCG/ACT nasal spray Commonly known as:  FLONASE SPRAY 1 SPRAY INTO EACH NOSTRIL EVERY DAY   FreeStyle Libre 14 Day Reader Devi Place 1 Device onto the skin every 14 (fourteen) days for 14 days. Use device with sensor   FreeStyle Libre 14 Day Sensor Misc Place 1 patch onto the skin every 14 (fourteen) days.   gabapentin 300 MG capsule Commonly known as: NEURONTIN TAKE 1 CAPSULE BY MOUTH TWICE A DAY   glipiZIDE 5 MG tablet Commonly known as: GLUCOTROL TAKE 1 TABLET (5 MG TOTAL) BY MOUTH 2 (TWO) TIMES DAILY BEFORE A MEAL.   glucose blood test strip USE TO CHECK BLOOD SUGARS ONCE DAILY. DX: E11.42   levocetirizine 5 MG tablet Commonly known as: XYZAL every evening.   meloxicam 15 MG tablet  Commonly known as: MOBIC TAKE 1 TABLET BY MOUTH EVERY DAY   metFORMIN 1000 MG tablet Commonly known as: GLUCOPHAGE TAKE 1 TABLET (1,000 MG TOTAL) BY MOUTH 2 (TWO) TIMES DAILY WITH A MEAL.   methocarbamol 750 MG tablet Commonly known as: ROBAXIN TAKE 1 TABLET BY MOUTH EVERY SIX TO EIGHT HOURS AS NEEDED FOR SPASM   montelukast 10 MG tablet Commonly known as: SINGULAIR Take 10 mg by mouth daily.   pioglitazone 45 MG tablet Commonly known as: ACTOS TAKE 1 TABLET BY MOUTH EVERY DAY   pravastatin 20 MG tablet Commonly known as: PRAVACHOL TAKE 1 TABLET BY MOUTH EVERY DAY   triamcinolone cream 0.1 % Commonly known as: KENALOG Apply 1 application topically 2 (two) times daily.   Ventolin HFA 108 (90 Base) MCG/ACT inhaler Generic drug: albuterol INHALE 2 PUFFS INTO THE LUNGS EVERY 4 HOURS AS NEEDED FOR WHEEZING OR SHORTNESS OF BREATH        ALLERGIES: Allergies  Allergen Reactions  . Codeine      REVIEW OF SYSTEMS: A comprehensive ROS was conducted with the patient and is negative except as per HPI and below:  Review of Systems  Constitutional: Negative for chills and fever.  HENT: Negative for congestion and sore throat.   Respiratory: Negative for cough and shortness of breath.   Cardiovascular: Negative for chest pain and palpitations.  Gastrointestinal: Positive for abdominal pain and diarrhea.  Musculoskeletal: Positive for joint pain.  Skin: Negative.   Neurological: Negative for tingling and tremors.      OBJECTIVE:   VITAL SIGNS: BP 124/78 (BP Location: Left Arm, Patient Position: Sitting, Cuff Size: Large)   Pulse 74   Temp 98.8 F (37.1 C)   Ht 5' 8"  (1.727 m)   Wt 250 lb 3.2 oz (113.5 kg)   SpO2 99%   BMI 38.04 kg/m    PHYSICAL EXAM:  General: Pt appears well and is in NAD  Hydration: Well-hydrated with moist mucous membranes and good skin turgor  HEENT: Head: Unremarkable  Eyes: External eye exam normal without stare, lid lag or exophthalmos.   EOM intact.    Neck: General: Supple without adenopathy or carotid bruits. Thyroid: Thyroid size normal.  No goiter or nodules appreciated. No thyroid bruit.  Lungs: Clear with good BS bilat with no rales, rhonchi, or wheezes  Heart: RRR with normal S1 and S2 and no gallops; no murmurs; no rub  Abdomen: Normoactive bowel sounds, soft, nontender, without masses or organomegaly palpable  Extremities:  Lower extremities - No pretibial edema.   Skin: Normal texture and temperature to palpation. No rash noted. No Acanthosis nigricans/skin tags. No lipohypertrophy.  Neuro: MS is good with appropriate affect, pt is alert and Ox3    DM  foot exam: 05/25/2019  The skin of the feet is intact without sores or ulcerations. The pedal pulses are 1+ on right and 1+ on left. The sensation is intact to a screening 5.07, 10 gram monofilament bilaterally  DATA REVIEWED:  Lab Results  Component Value Date   HGBA1C 7.5 (A) 05/25/2019   HGBA1C 7.9 (H) 11/12/2018   HGBA1C 6.9 (H) 02/24/2018   Lab Results  Component Value Date   MICROALBUR 8.0 (H) 11/12/2018   LDLCALC 91 11/12/2018   CREATININE 0.75 11/12/2018   Lab Results  Component Value Date   MICRALBCREAT 10.9 11/12/2018    Lab Results  Component Value Date   CHOL 163 11/12/2018   HDL 32.50 (L) 11/12/2018   LDLCALC 91 11/12/2018   LDLDIRECT 88.0 08/15/2016   TRIG 197.0 (H) 11/12/2018   CHOLHDL 5 11/12/2018        ASSESSMENT / PLAN / RECOMMENDATIONS:   1) Type 2 Diabetes Mellitus, Sub-Optimally controlled, With Neuropathic complications - Most recent A1c of 7.5 %. Goal A1c < 7.0 %.    Plan: GENERAL: I have discussed with the patient the pathophysiology of diabetes. We went over the natural progression of the disease. We talked about both insulin resistance and insulin deficiency. We stressed the importance of lifestyle changes including diet and exercise. I explained the complications associated with diabetes including retinopathy,  nephropathy, neuropathy as well as increased risk of cardiovascular disease. We went over the benefit seen with glycemic control.    I explained to the patient that diabetic patients are at higher than normal risk for amputations.   Pt will be referred to our RD for low CHO discussion  He is having GI issues, will reduce metformin dose as below, he is also not a candidate for GLP-1 agonists at this time due to GI side effects  We did discuss options of increasing Glipizide vs adding an SGLT-2 inhibitor , after discussing risks and benefits, pt opted to start Farxiga, cautioned against dehydration and genital infections  Will check BMP next week as no phlebotomist is available today   MEDICATIONS: - Decrease Metformin 100 mg to once daily  - Start Farxiga 5 mg before breakfast for 30 days, if no side effects , please pick up the 10 mg tablets - Continue Glipizide 5 mg Twice daily before meals - Continue Pioglitazone 45 mg daily    EDUCATION / INSTRUCTIONS:  BG monitoring instructions: Patient is instructed to check his blood sugars 3 times a day, through freestyle libre.  Call [facility] if: BG persistently < 70 or > 300. . I reviewed the Rule of 15 for the treatment of hypoglycemia in detail with the patient. Literature supplied.   2) Diabetic complications:   Eye: Does not have known diabetic retinopathy.   Neuro/ Feet: Does have known diabetic peripheral neuropathy.  Renal: Patient does not have known baseline CKD. He is not on an ACEI/ARB at present.   3) Lipids: Patient is on pravastatin. We discussed the cardiovascular benefits of statin in the diabetic patients.     F/u in 8 weeks     Signed electronically by: Mack Guise, MD  [facility]  [facility] [address] [place], [place] [address] Phone: [phone] FAX: [phone]   CC: Owens Loffler, [place]  [place] [address] Phone: [phone]  Fax: [phone]    Return to Endocrinology clinic as below: Future Appointments  Date Time Provider [place]  07/25/2019  8:00 AM Copland, Frederico Hamman, MD [facility]

## 2019-05-31 ENCOUNTER — Other Ambulatory Visit: Payer: Self-pay

## 2019-05-31 ENCOUNTER — Other Ambulatory Visit (INDEPENDENT_AMBULATORY_CARE_PROVIDER_SITE_OTHER): Payer: BC Managed Care – PPO

## 2019-05-31 DIAGNOSIS — E1142 Type 2 diabetes mellitus with diabetic polyneuropathy: Secondary | ICD-10-CM | POA: Diagnosis not present

## 2019-05-31 DIAGNOSIS — J301 Allergic rhinitis due to pollen: Secondary | ICD-10-CM | POA: Diagnosis not present

## 2019-05-31 DIAGNOSIS — J3089 Other allergic rhinitis: Secondary | ICD-10-CM | POA: Diagnosis not present

## 2019-05-31 LAB — BASIC METABOLIC PANEL
BUN: 24 mg/dL — ABNORMAL HIGH (ref 6–23)
CO2: 26 mEq/L (ref 19–32)
Calcium: 9.4 mg/dL (ref 8.4–10.5)
Chloride: 99 mEq/L (ref 96–112)
Creatinine, Ser: 0.78 mg/dL (ref 0.40–1.50)
GFR: 108.62 mL/min (ref >60.00)
Glucose, Bld: 186 mg/dL — ABNORMAL HIGH (ref 70–99)
Potassium: 4.3 mEq/L (ref 3.5–5.1)
Sodium: 135 mEq/L (ref 135–145)

## 2019-06-04 ENCOUNTER — Encounter: Payer: Self-pay | Admitting: Internal Medicine

## 2019-06-07 DIAGNOSIS — J301 Allergic rhinitis due to pollen: Secondary | ICD-10-CM | POA: Diagnosis not present

## 2019-06-07 DIAGNOSIS — J3089 Other allergic rhinitis: Secondary | ICD-10-CM | POA: Diagnosis not present

## 2019-06-09 DIAGNOSIS — J3089 Other allergic rhinitis: Secondary | ICD-10-CM | POA: Diagnosis not present

## 2019-06-09 DIAGNOSIS — J301 Allergic rhinitis due to pollen: Secondary | ICD-10-CM | POA: Diagnosis not present

## 2019-06-14 DIAGNOSIS — J301 Allergic rhinitis due to pollen: Secondary | ICD-10-CM | POA: Diagnosis not present

## 2019-06-14 DIAGNOSIS — J3089 Other allergic rhinitis: Secondary | ICD-10-CM | POA: Diagnosis not present

## 2019-06-17 ENCOUNTER — Other Ambulatory Visit: Payer: Self-pay | Admitting: Internal Medicine

## 2019-06-21 DIAGNOSIS — J301 Allergic rhinitis due to pollen: Secondary | ICD-10-CM | POA: Diagnosis not present

## 2019-06-21 DIAGNOSIS — J3089 Other allergic rhinitis: Secondary | ICD-10-CM | POA: Diagnosis not present

## 2019-06-27 ENCOUNTER — Encounter: Payer: Self-pay | Admitting: Dietician

## 2019-06-27 ENCOUNTER — Other Ambulatory Visit: Payer: Self-pay

## 2019-06-27 ENCOUNTER — Encounter: Payer: BC Managed Care – PPO | Attending: Family Medicine | Admitting: Dietician

## 2019-06-27 DIAGNOSIS — E1142 Type 2 diabetes mellitus with diabetic polyneuropathy: Secondary | ICD-10-CM

## 2019-06-27 NOTE — Patient Instructions (Addendum)
Consider drinking water before your coffee. Before eating a snack ask, "am I hungry or eating for another reason".  Choose snacks that are low in carbohydrate such as nuts, cheese, lean meat, vegetables. Consider lower fat sausage and portion size. Mindfulness when eating.  When am I satisfied or full (rather than overfull).  Paying attention to portion sizes. Find ways to be more active Continue to bake rather than fry. Continue to choose beverages without sugar/carbohydrates

## 2019-06-27 NOTE — Progress Notes (Signed)
Diabetes Self-Management Education  Visit Type: First/Initial  Appt. Start Time: 1330 Appt. End Time: 1500  06/27/2019  [name], identified by name and date of birth, is a 44 y.o. male with a diagnosis of Diabetes: Type 2.   ASSESSMENT Patient is here today with his wife Lonnie Jones.  History includes Type 2 Diabetes diagnosed after knee surgery in 2016.  He gained 50 lbs at that time.  Other history includes neuropathy (prior to diabetes dx), HLD, GERD.  Medications include:  Farxiga, Metformin (tolerating the lower dose and diarrhea has improved significantly), glipizide, pioglitazone. He uses the [employer]. Former smoker, current tobacco user (oral). Labs noted:  A1C 05/25/2019 7.5% decreased from 7.9% 11/2018 Cholesterol 163, HDL 32, LDL 91, Triglycerides 197 (11/2019)  Weight hx: 287 lbs 2016 when diagnosed with diabetes and decreased to 215 lbs and then regained to 248 lbs over time.  Works in Social worker.  He had shoulder surgery and has not worked in the past 7 months.  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  He barely has time to eat when he is working but snacks constantly since he is off work often due to boredom. States that he needs knee replacement and this limits exercise.  He does not tolerate increased fat as this causes diarrhea.  Height _0  (1.727 m), weight 258 lb (117 kg). Body mass index is 39.23 kg/m.  Diabetes Self-Management Education - 06/27/19 1552      Visit Information   Visit Type  First/Initial      Initial Visit   Diabetes Type  Type 2    Are you currently following a meal plan?  No    Are you taking your medications as prescribed?  Yes    Date Diagnosed  2016      Health Coping   How would you rate your overall health?  Good      Psychosocial Assessment   Patient Belief/Attitude about Diabetes  Other (comment)   Hates but manages   Self-care barriers  None    Self-management support  Doctor's  office;Family    Other persons present  Patient    Patient Concerns  Nutrition/Meal planning    Special Needs  None    Preferred Learning Style  No preference indicated    Learning Readiness  Ready    How often do you need to have someone help you when you read instructions, pamphlets, or other written materials from your doctor or pharmacy?  1 - Never    What is the last grade level you completed in school?  10th grade      Pre-Education Assessment   Patient understands the diabetes disease and treatment process.  Needs Review    Patient understands incorporating nutritional management into lifestyle.  Needs Review    Patient undertands incorporating physical activity into lifestyle.  Needs Review    Patient understands using medications safely.  Needs Review    Patient understands monitoring blood glucose, interpreting and using results  Needs Review    Patient understands prevention, detection, and treatment of acute complications.  Needs Review    Patient understands prevention, detection, and treatment of chronic complications.  Needs Review    Patient understands how to develop strategies to address psychosocial issues.  Needs Review    Patient understands how to develop strategies to promote health/change behavior.  Needs Review      Complications   Last HgB A1C per patient/outside source  7.5 %   05/25/2019 decreased from 7.9% 11/12/2018   How often do you check your blood sugar?  > 4 times/day    Fasting Blood glucose range (mg/dL)  130-179   about 130 this am   Postprandial Blood glucose range (mg/dL)  >200   220 after lunch today   Number of hypoglycemic episodes per month  0    Number of hyperglycemic episodes per week  14    Have you had a dilated eye exam in the past 12 months?  Yes    Have you had a dental exam in the past 12 months?  Yes    Are you checking your feet?  Yes    How many days per week are you checking your feet?  7      Dietary Intake   Breakfast  2  sausage, scrabled eggs, [employer] low carb toast, butter (usually mayo and most of the time without bread)    Snack (morning)  2 string cheese    Lunch  2 hot dogs with buns, green beens OR Chicken nuggets with sauce OR 1/4 pounder with fries    Snack (afternoon)  string cheese or chicken salad, or spoon of peanut butter or Cookies if they are out    [employer], mac and cheese, creamed corn, sauteed veges (asparagus, peppers, zuchini), ice cream sandwich, sliver of cheese cake (last night) OR [place] OR [place] once per week    Snack (evening)  something sweet    Beverage(s)  coffee with creamer with splenda, diet soda (2-3 per day), water (24-36 oz daily)      Exercise   Exercise Type  Light (walking / raking leaves)      Patient Education   Previous Diabetes Education  No    Disease state   Definition of diabetes, type 1 and 2, and the diagnosis of diabetes;Factors that contribute to the development of diabetes    Nutrition management   Role of diet in the treatment of diabetes and the relationship between the three main macronutrients and blood glucose level;Food label reading, portion sizes and measuring food.;Meal options for control of blood glucose level and chronic complications.;Information on hints to eating out and maintain blood glucose control.    Physical activity and exercise   Role of exercise on diabetes management, blood pressure control and cardiac health.;Helped patient identify appropriate exercises in relation to his/her diabetes, diabetes complications and other health issue.    Medications  Reviewed patients medication for diabetes, action, purpose, timing of dose and side effects.    Monitoring  Purpose and frequency of SMBG.;Identified appropriate SMBG and/or A1C goals.;Daily foot exams;Yearly dilated eye exam    Acute complications  Taught treatment of hypoglycemia - the 15 rule.    Chronic complications  Relationship between chronic complications and blood glucose  control    Psychosocial adjustment  Worked with patient to identify barriers to care and solutions;Role of stress on diabetes;Brainstormed with patient on coping mechanisms for social situations, getting support from significant others, dealing with feelings about diabetes    Personal strategies to promote health  Lifestyle issues that need to be addressed for better diabetes care      Individualized Goals (developed by patient)   Nutrition  General guidelines for healthy choices and portions discussed    Physical Activity  Exercise 3-5 times per week;30 minutes per day    Medications  take my medication as prescribed    Monitoring   test  my blood glucose as discussed    Reducing Risk  increase portions of healthy fats    Health Coping  discuss diabetes with (comment)      Post-Education Assessment   Patient understands the diabetes disease and treatment process.  Demonstrates understanding / competency    Patient understands incorporating nutritional management into lifestyle.  Needs Review    Patient undertands incorporating physical activity into lifestyle.  Demonstrates understanding / competency    Patient understands using medications safely.  Demonstrates understanding / competency    Patient understands monitoring blood glucose, interpreting and using results  Demonstrates understanding / competency    Patient understands prevention, detection, and treatment of acute complications.  Demonstrates understanding / competency    Patient understands prevention, detection, and treatment of chronic complications.  Demonstrates understanding / competency    Patient understands how to develop strategies to address psychosocial issues.  Needs Review    Patient understands how to develop strategies to promote health/change behavior.  Needs Review      Outcomes   Expected Outcomes  Demonstrated interest in learning. Expect positive outcomes    Future DMSE  PRN    Program Status  Completed        Individualized Plan for Diabetes Self-Management Training:   Learning Objective:  Patient will have a greater understanding of diabetes self-management. Patient education plan is to attend individual and/or group sessions per assessed needs and concerns.   Plan:   Patient Instructions  Consider drinking water before your coffee. Before eating a snack ask, "am I hungry or eating for another reason".  Choose snacks that are low in carbohydrate such as nuts, cheese, lean meat, vegetables. Consider lower fat sausage and portion size. Mindfulness when eating.  When am I satisfied or full (rather than overfull).  Paying attention to portion sizes. Find ways to be more active Continue to bake rather than fry. Continue to choose beverages without sugar/carbohydrates   Expected Outcomes:  Demonstrated interest in learning. Expect positive outcomes  Education material provided: ADA - How to Thrive: A Guide for Your Journey with Diabetes, Food label handouts, Meal plan card and Snack sheet  If problems or questions, patient to contact team via:  Phone and Email  Future DSME appointment: PRN

## 2019-06-28 DIAGNOSIS — J301 Allergic rhinitis due to pollen: Secondary | ICD-10-CM | POA: Diagnosis not present

## 2019-06-28 DIAGNOSIS — J3089 Other allergic rhinitis: Secondary | ICD-10-CM | POA: Diagnosis not present

## 2019-07-05 DIAGNOSIS — J3089 Other allergic rhinitis: Secondary | ICD-10-CM | POA: Diagnosis not present

## 2019-07-05 DIAGNOSIS — J301 Allergic rhinitis due to pollen: Secondary | ICD-10-CM | POA: Diagnosis not present

## 2019-07-06 ENCOUNTER — Encounter: Payer: Self-pay | Admitting: Internal Medicine

## 2019-07-06 ENCOUNTER — Other Ambulatory Visit: Payer: Self-pay | Admitting: Family Medicine

## 2019-07-07 MED ORDER — FREESTYLE LIBRE 2 READER DEVI: 1.0000 | 0 refills | Status: DC

## 2019-07-07 MED ORDER — FREESTYLE LIBRE 2 SENSOR MISC: 1.0000 | 11 refills | Status: DC

## 2019-07-12 DIAGNOSIS — J301 Allergic rhinitis due to pollen: Secondary | ICD-10-CM | POA: Diagnosis not present

## 2019-07-12 DIAGNOSIS — J3089 Other allergic rhinitis: Secondary | ICD-10-CM | POA: Diagnosis not present

## 2019-07-13 DIAGNOSIS — J301 Allergic rhinitis due to pollen: Secondary | ICD-10-CM | POA: Diagnosis not present

## 2019-07-14 DIAGNOSIS — J3089 Other allergic rhinitis: Secondary | ICD-10-CM | POA: Diagnosis not present

## 2019-07-17 ENCOUNTER — Other Ambulatory Visit: Payer: Self-pay | Admitting: Family Medicine

## 2019-07-18 ENCOUNTER — Other Ambulatory Visit: Payer: Self-pay | Admitting: Family Medicine

## 2019-07-18 MED ORDER — ESZOPICLONE 1 MG PO TABS: 1.0000 mg | ORAL_TABLET | Freq: Every evening | ORAL | 1 refills | Status: DC | PRN

## 2019-07-18 NOTE — Telephone Encounter (Signed)
Last office visit 03/02/2019 for rash.  Last refilled 12/30/2018 for #90 with 1 refill.  No future appointments.

## 2019-07-19 DIAGNOSIS — J3089 Other allergic rhinitis: Secondary | ICD-10-CM | POA: Diagnosis not present

## 2019-07-19 DIAGNOSIS — J301 Allergic rhinitis due to pollen: Secondary | ICD-10-CM | POA: Diagnosis not present

## 2019-07-22 ENCOUNTER — Other Ambulatory Visit: Payer: Self-pay

## 2019-07-25 ENCOUNTER — Ambulatory Visit: Payer: BC Managed Care – PPO | Admitting: Family Medicine

## 2019-07-25 NOTE — Progress Notes (Signed)
Name: Lonnie Jones  Age/ Sex: 44 y.o., male   MRN/ DOB: [id number], [birth year]     PCP: Lonnie Loffler, MD   Reason for Endocrinology Evaluation: Type 2 Diabetes Mellitus  Initial Endocrine Consultative Visit: 05/25/2019    PATIENT IDENTIFIER: [name] is a 44 y.o. male with a past medical history of T2DM, Asthma,and  Dyslipidemia. The patient has followed with Endocrinology clinic since 05/25/2019 for consultative assistance with management of his diabetes.  DIABETIC HISTORY:  Lonnie Jones was diagnosed with T2DM in 2016, he has been on oral glycemic agents since his diagnosis. No prior use of insulin. His hemoglobin A1c has ranged from  6.9% in 2019, peaking at 8.5% in 07/2017.  On his initial visit to our clinic his A1c was 7.5%. He was on metformin, glipizide and pioglitazone. We reduced Metformin due to GI side effects and started farxiga.   Works in sewer and drain cleaning  SUBJECTIVE:   During the last visit (05/25/2019): A1c 7.5%. We reduced metformin due to GI side effects, we continued glipizide and pioglitazone and added farxiga.    Today (07/26/2019): Lonnie Jones is here for a follow up on diabetes management.  He checks his blood sugars a few times a day through the CGM but did not bring it today.  The patient has not had hypoglycemic episodes since the last clinic visit. Otherwise, the patient has not required any recent emergency interventions for hypoglycemia and has not had recent hospitalizations secondary to hyper or hypoglycemic episodes.    Denies any complaints  HOME DIABETES REGIMEN:  Metformin 1000 mg daily  Farxiga 10 mg daily  Glipizide 5 mg BID Pioglitazone 45 mg daily      Statin: Yes ACE-I/ARB: Yes    METER DOWNLOAD SUMMARY:  Memory recall 90- 140 mg/dL    DIABETIC COMPLICATIONS: Microvascular complications:   Neuropathy  Denies: CKD, retinopathy   Last eye exam: Completed 05/2019  Macrovascular complications:    Denies: CAD, PVD, CVA   HISTORY:  Past Medical History:  Past Medical History:  Diagnosis Date  . Acute idiopathic gout involving toe 06/22/2017  . Allergic rhinitis due to allergen 08/25/2011  . Allergy   . Asthma   . Diabetes mellitus   . GERD (gastroesophageal reflux disease)   . Hyperlipidemia LDL goal < 70 12/16/2010  . Migraine headache   . Type 2 diabetes mellitus with peripheral neuropathy ([place]) 08/08/2010   Qualifier: Diagnosis of  By: Lorelei Pont MD, Frederico Hamman     Past Surgical History:  Past Surgical History:  Procedure Laterality Date  . APPENDECTOMY  2000  . CARPAL TUNNEL RELEASE    . KNEE SURGERY  2011    Social History:  reports that he quit smoking about 17 years ago. His smokeless tobacco use includes snuff. He reports that he does not drink alcohol or use drugs.  Family History: No family history on file.   HOME MEDICATIONS: Allergies as of 07/26/2019      Reactions   Codeine       Medication List       Accurate as of July 26, 2019 11:47 AM. If you have any questions, ask your nurse or doctor.        azelastine 0.1 % nasal spray Commonly known as: ASTELIN INHALE 2 SPRAYS IN EACH NOSTRIL TWICE A DAY   Bayer Contour Next Monitor w/Device Kit 1 each by Does not apply route daily.   Bayer Microlet Lancets lancets Use to check blood  sugars once daily. E11.42   EPINEPHrine 0.3 mg/0.3 mL Soaj injection Commonly known as: EPI-PEN See admin instructions. for allergic reaction   eszopiclone 1 MG Tabs tablet Commonly known as: LUNESTA Take 1 tablet (1 mg total) by mouth at bedtime as needed for sleep. Take immediately before bedtime   Farxiga 5 MG Tabs tablet Generic drug: dapagliflozin propanediol Take 5 mg by mouth daily before breakfast.   Farxiga 10 MG Tabs tablet Generic drug: dapagliflozin propanediol Take 10 mg by mouth daily before breakfast.   fluticasone 50 MCG/ACT nasal spray Commonly known as: FLONASE SPRAY 1 SPRAY INTO EACH  NOSTRIL EVERY DAY   FreeStyle Libre 2 Reader Devi 1 Device by Does not apply route as directed.   FreeStyle Libre 2 Sensor Misc 1 Device by Does not apply route as directed.   gabapentin 300 MG capsule Commonly known as: NEURONTIN TAKE 1 CAPSULE BY MOUTH TWICE A DAY   glipiZIDE 5 MG tablet Commonly known as: GLUCOTROL TAKE 1 TABLET (5 MG TOTAL) BY MOUTH 2 (TWO) TIMES DAILY BEFORE A MEAL.   glucose blood test strip USE TO CHECK BLOOD SUGARS ONCE DAILY. DX: E11.42   levocetirizine 5 MG tablet Commonly known as: XYZAL every evening.   meloxicam 15 MG tablet Commonly known as: MOBIC TAKE 1 TABLET BY MOUTH EVERY DAY   metFORMIN 1000 MG tablet Commonly known as: GLUCOPHAGE Take 1 tablet (1,000 mg total) by mouth daily.   methocarbamol 750 MG tablet Commonly known as: ROBAXIN TAKE 1 TABLET BY MOUTH EVERY SIX TO EIGHT HOURS AS NEEDED FOR SPASM   montelukast 10 MG tablet Commonly known as: SINGULAIR Take 10 mg by mouth daily.   pioglitazone 45 MG tablet Commonly known as: ACTOS TAKE 1 TABLET BY MOUTH EVERY DAY   pravastatin 20 MG tablet Commonly known as: PRAVACHOL TAKE 1 TABLET BY MOUTH EVERY DAY   triamcinolone cream 0.1 % Commonly known as: KENALOG Apply 1 application topically 2 (two) times daily.   Ventolin HFA 108 (90 Base) MCG/ACT inhaler Generic drug: albuterol INHALE 2 PUFFS INTO THE LUNGS EVERY 4 HOURS AS NEEDED FOR WHEEZING OR SHORTNESS OF BREATH        OBJECTIVE:   Vital Signs: BP 118/68 (BP Location: Left Arm, Patient Position: Sitting, Cuff Size: Large)   Pulse 84   Temp 98.1 F (36.7 C)   Ht '5\' 8"'$  (1.727 m)   Wt 244 lb 12.8 oz (111 kg)   SpO2 98%   BMI 37.22 kg/m   Wt Readings from Last 3 Encounters:  07/26/19 244 lb 12.8 oz (111 kg)  06/27/19 258 lb (117 kg)  05/25/19 250 lb 3.2 oz (113.5 kg)     Exam: General: Pt appears well and is in NAD  Neck: General: Supple without adenopathy. Thyroid: Thyroid size normal.  No goiter or  nodules appreciated. No thyroid bruit.  Lungs: Clear with good BS bilat with no rales, rhonchi, or wheezes  Heart: RRR with normal S1 and S2 and no gallops; no murmurs; no rub  Abdomen: Normoactive bowel sounds, soft, nontender, without masses or organomegaly palpable  Extremities: No pretibial edema.   Neuro: MS is good with appropriate affect, pt is alert and Ox3    DM foot exam: 05/25/2019  The skin of the feet is intact without sores or ulcerations. The pedal pulses are 1+ on right and 1+ on left. The sensation is intact to a screening 5.07, 10 gram monofilament bilaterally    DATA REVIEWED:  Lab Results  Component Value Date   HGBA1C 6.4 (A) 07/26/2019   HGBA1C 7.5 (A) 05/25/2019   HGBA1C 7.9 (H) 11/12/2018   Lab Results  Component Value Date   MICROALBUR 8.0 (H) 11/12/2018   LDLCALC 91 11/12/2018   CREATININE 0.78 05/31/2019   Lab Results  Component Value Date   MICRALBCREAT 10.9 11/12/2018     Lab Results  Component Value Date   CHOL 163 11/12/2018   HDL 32.50 (L) 11/12/2018   LDLCALC 91 11/12/2018   LDLDIRECT 88.0 08/15/2016   TRIG 197.0 (H) 11/12/2018   CHOLHDL 5 11/12/2018         ASSESSMENT / PLAN / RECOMMENDATIONS:   1) Type 2 Diabetes Mellitus, Optimally controlled, With Neuropathic complications - Most recent A1c of 6.4 %. Goal A1c < 7.0 %.    -I have praised the patient on optimal glucose control and lifestyle changes, he has lost 14 pounds in the past few weeks. -His GI symptoms have improved with reduction of metformin dose. -He is tolerating [place] well without any side effects. -Patient advised to contact us should his BG's remain consistently below 80 MGs/DL, to start reducing some of his other medications.   MEDICATIONS: - Continue Metformin 1000 mg once daily  - Continue Farxiga 10 mg tablets - Continue Glipizide 5 mg Twice daily before meals - Continue Pioglitazone 45 mg daily   EDUCATION / INSTRUCTIONS:  BG monitoring  instructions: Patient is instructed to check his blood sugars 3 times a day.  Call [place] [facility] clinic if: BG persistently < 70 or > 300. . I reviewed the Rule of 15 for the treatment of hypoglycemia in detail with the patient. Literature supplied.    F/U in 4 months   Signed electronically by: Mack Guise, MD  [facility] Endocrinology  [facility] [address] [place], [place] [address] Phone: [phone] FAX: [phone]   CC: Lonnie Jones, [place] [address] Phone: [phone]  Fax: [phone]  Return to Endocrinology clinic as below: Future Appointments  Date Time Provider [place]  11/23/2019 10:30 AM Ajanee Buren, Melanie Crazier, MD [id number] None

## 2019-07-26 ENCOUNTER — Ambulatory Visit: Payer: BC Managed Care – PPO | Admitting: Internal Medicine

## 2019-07-26 ENCOUNTER — Encounter: Payer: Self-pay | Admitting: Internal Medicine

## 2019-07-26 ENCOUNTER — Other Ambulatory Visit: Payer: Self-pay

## 2019-07-26 VITALS — BP 118/68 | HR 84 | Temp 98.1°F | Ht 68.0 in | Wt 244.8 lb

## 2019-07-26 DIAGNOSIS — J3089 Other allergic rhinitis: Secondary | ICD-10-CM | POA: Diagnosis not present

## 2019-07-26 DIAGNOSIS — E1142 Type 2 diabetes mellitus with diabetic polyneuropathy: Secondary | ICD-10-CM

## 2019-07-26 DIAGNOSIS — J301 Allergic rhinitis due to pollen: Secondary | ICD-10-CM | POA: Diagnosis not present

## 2019-07-26 LAB — POCT GLYCOSYLATED HEMOGLOBIN (HGB A1C): Hemoglobin A1C: 6.4 % — AB (ref 4.0–5.6)

## 2019-07-26 LAB — GLUCOSE, POCT (MANUAL RESULT ENTRY): POC Glucose: 180 mg/dl — AB (ref 70–99)

## 2019-07-26 MED ORDER — GLIPIZIDE 5 MG PO TABS: 5.0000 mg | ORAL_TABLET | Freq: Two times a day (BID) | ORAL | 3 refills | Status: DC

## 2019-07-26 MED ORDER — FARXIGA 10 MG PO TABS: 10.0000 mg | ORAL_TABLET | Freq: Every day | ORAL | 3 refills | Status: DC

## 2019-07-26 MED ORDER — METFORMIN HCL 1000 MG PO TABS: 1000.0000 mg | ORAL_TABLET | Freq: Every day | ORAL | 3 refills | Status: DC

## 2019-07-26 MED ORDER — PIOGLITAZONE HCL 45 MG PO TABS: 45.0000 mg | ORAL_TABLET | Freq: Every day | ORAL | 3 refills | Status: DC

## 2019-07-26 NOTE — Patient Instructions (Addendum)
-    Continue Metformin 1000 mg once daily  - Continue Farxiga 10 mg tablets - Continue Glipizide 5 mg Twice daily before meals - Continue Pioglitazone 45 mg daily     - Please call us if you have sugars at or below 70 mg/dL      HOW TO TREAT LOW BLOOD SUGARS (Blood sugar LESS THAN 70 MG/DL)  Please follow the RULE OF 15 for the treatment of hypoglycemia treatment (when your (blood sugars are less than 70 mg/dL)    STEP 1: Take 15 grams of carbohydrates when your blood sugar is low, which includes:   3-4 GLUCOSE TABS  OR  3-4 OZ OF JUICE OR REGULAR SODA OR  ONE TUBE OF GLUCOSE GEL     STEP 2: RECHECK blood sugar in 15 MINUTES STEP 3: If your blood sugar is still low at the 15 minute recheck --> then, go back to STEP 1 and treat AGAIN with another 15 grams of carbohydrates.

## 2019-08-02 DIAGNOSIS — J301 Allergic rhinitis due to pollen: Secondary | ICD-10-CM | POA: Diagnosis not present

## 2019-08-02 DIAGNOSIS — J3089 Other allergic rhinitis: Secondary | ICD-10-CM | POA: Diagnosis not present

## 2019-08-09 DIAGNOSIS — J3089 Other allergic rhinitis: Secondary | ICD-10-CM | POA: Diagnosis not present

## 2019-08-09 DIAGNOSIS — J301 Allergic rhinitis due to pollen: Secondary | ICD-10-CM | POA: Diagnosis not present

## 2019-08-10 ENCOUNTER — Encounter: Payer: Self-pay | Admitting: Family Medicine

## 2019-08-10 ENCOUNTER — Ambulatory Visit (INDEPENDENT_AMBULATORY_CARE_PROVIDER_SITE_OTHER): Payer: BC Managed Care – PPO | Admitting: Family Medicine

## 2019-08-10 ENCOUNTER — Other Ambulatory Visit: Payer: Self-pay

## 2019-08-10 VITALS — BP 120/80 | HR 75 | Temp 98.4°F | Ht 68.0 in | Wt 239.2 lb

## 2019-08-10 DIAGNOSIS — M549 Dorsalgia, unspecified: Secondary | ICD-10-CM

## 2019-08-10 MED ORDER — TIZANIDINE HCL 4 MG PO TABS: 4.0000 mg | ORAL_TABLET | Freq: Every evening | ORAL | 2 refills | Status: DC | PRN

## 2019-08-10 MED ORDER — PREDNISONE 20 MG PO TABS: ORAL_TABLET | ORAL | 0 refills | Status: DC

## 2019-08-10 NOTE — Progress Notes (Signed)
Azuree Minish T. Bobetta Korf, MD Primary [facility] and [place] [facility] [place], [address] Phone: [phone]  FAX: [place] - 44 y.o. male  MRN [id number]  Date of Birth: 1976-05-28  Visit Date: 08/10/2019  PCP: Owens Loffler, MD  Referred by: Owens Loffler, MD  Chief Complaint  Patient presents with  . Back Pain    This visit occurred during the SARS-CoV-2 public health emergency.  Safety protocols were in place, including screening questions prior to the visit, additional usage of staff PPE, and extensive cleaning of exam room while observing appropriate contact time as indicated for disinfecting solutions.   Subjective:   Lonnie Jones is a 44 y.o. very pleasant male patient who presents with the following: Back Pain  ongoing for approximately: 24 to 48 hours The patient has had back pain before. The back pain is localized into the lumbar spine area. They also describe no radiculopathy.  Bent oer in his Lucianne Lei.  Hurts breathing. Squatted and bent down and had to sit on.  Buttocks and low back.    No numbness or tingling. No bowel or bladder incontinence. No focal weakness. Prior interventions: None Physical therapy: No Chiropractic manipulations: No Acupuncture: No Osteopathic manipulation: No Heat or cold: Minimal effect   Review of Systems is noted in the HPI, as appropriate  Objective:   Blood pressure 120/80, pulse 75, temperature 98.4 F (36.9 C), temperature source Temporal, height 5' 8"  (1.727 m), weight 239 lb 4 oz (108.5 kg), SpO2 97 %.  GEN: No acute distress; alert,appropriate. PULM: Breathing comfortably in no respiratory distress PSYCH: Normally interactive.   Range of motion at  the waist: Flexion, rotation and lateral bending: Minimal limitation  No echymosis or edema Rises to examination table with no difficulty Gait: minimally antalgic  Inspection/Deformity:  No abnormality Paraspinus T: Mildly tender bilaterally L3-S1  B Ankle Dorsiflexion (L5,4): 5/5 B Great Toe Dorsiflexion (L5,4): 5/5 Heel Walk (L5): WNL Toe Walk (S1): WNL Rise/Squat (L4): WNL, mild pain  SENSORY B Medial Foot (L4): WNL B Dorsum (L5): WNL B Lateral (S1): WNL Light Touch: WNL Pinprick: WNL  REFLEXES Knee (L4): 2+ Ankle (S1): 2+  B SLR, seated: neg B SLR, supine: neg B FABER: neg B Reverse FABER: neg B Greater Troch: NT B Log Roll: neg B Stork: NT B Sciatic Notch: NT  Radiology: No results found.  Assessment and Plan:     ICD-10-CM   1. Acute back pain, unspecified back location, unspecified back pain laterality  M54.9     Level of Medical Decision-Making in this case is MODERATE.  Anatomy reviewed. Conservative algorithms for acute back pain generally begin with the following: NSAIDS, Muscle Relaxants, Mild pain medication.  NSAIDs have not made any impact, and he is already on Mobic 15 mg daily.  The Robaxin that he had available also was not helpful. I gave him some steroids and Zanaflex.  Start with medications, core rehab, and progress from there following low back pain algorithm. No red flags are present.  Follow-up: No follow-ups on file.  Meds ordered this encounter  Medications  . tiZANidine (ZANAFLEX) 4 MG tablet    Sig: Take 1 tablet (4 mg total) by mouth at bedtime as needed for muscle spasms.    Dispense:  30 tablet    Refill:  2  . predniSONE (DELTASONE) 20 MG tablet    Sig: 2 tabs po daily for  5 days, then 1 tab po daily for 5 days    Dispense:  15 tablet    Refill:  0   Medications Discontinued During This Encounter  Medication Reason  . VENTOLIN HFA 108 (90 Base) MCG/ACT inhaler Completed Course  . methocarbamol (ROBAXIN) 750 MG tablet    No orders of the defined types were placed in this encounter.   Signed,  Maud Deed. Selim Durden, MD   Outpatient Encounter Medications as of 08/10/2019  Medication Sig  . azelastine  (ASTELIN) 0.1 % nasal spray INHALE 2 SPRAYS IN EACH NOSTRIL TWICE A DAY  . BAYER MICROLET LANCETS lancets Use to check blood sugars once daily. E11.42  . Blood Glucose Monitoring Suppl (BAYER CONTOUR NEXT MONITOR) w/Device KIT 1 each by Does not apply route daily.  . Continuous Blood Gluc Receiver (FREESTYLE LIBRE 2 READER) DEVI 1 Device by Does not apply route as directed.  . Continuous Blood Gluc Sensor (FREESTYLE LIBRE 2 SENSOR) MISC 1 Device by Does not apply route as directed.  . dapagliflozin propanediol (FARXIGA) 10 MG TABS tablet Take 10 mg by mouth daily before breakfast.  . EPINEPHrine 0.3 mg/0.3 mL IJ SOAJ injection See admin instructions. for allergic reaction  . eszopiclone (LUNESTA) 1 MG TABS tablet Take 1 tablet (1 mg total) by mouth at bedtime as needed for sleep. Take immediately before bedtime  . fluticasone (FLONASE) 50 MCG/ACT nasal spray SPRAY 1 SPRAY INTO EACH NOSTRIL EVERY DAY  . gabapentin (NEURONTIN) 300 MG capsule TAKE 1 CAPSULE BY MOUTH TWICE A DAY  . glipiZIDE (GLUCOTROL) 5 MG tablet Take 1 tablet (5 mg total) by mouth 2 (two) times daily before a meal.  . glucose blood test strip USE TO CHECK BLOOD SUGARS ONCE DAILY. DX: E11.42  . levocetirizine (XYZAL) 5 MG tablet every evening.  . meloxicam (MOBIC) 15 MG tablet TAKE 1 TABLET BY MOUTH EVERY DAY  . metFORMIN (GLUCOPHAGE) 1000 MG tablet Take 1 tablet (1,000 mg total) by mouth daily.  . montelukast (SINGULAIR) 10 MG tablet Take 10 mg by mouth daily.  . pioglitazone (ACTOS) 45 MG tablet Take 1 tablet (45 mg total) by mouth daily.  . pravastatin (PRAVACHOL) 20 MG tablet TAKE 1 TABLET BY MOUTH EVERY DAY  . triamcinolone cream (KENALOG) 0.1 % Apply 1 application topically 2 (two) times daily.  . [DISCONTINUED] methocarbamol (ROBAXIN) 750 MG tablet TAKE 1 TABLET BY MOUTH EVERY SIX TO EIGHT HOURS AS NEEDED FOR SPASM  . predniSONE (DELTASONE) 20 MG tablet 2 tabs po daily for 5 days, then 1 tab po daily for 5 days  .  tiZANidine (ZANAFLEX) 4 MG tablet Take 1 tablet (4 mg total) by mouth at bedtime as needed for muscle spasms.  . [DISCONTINUED] VENTOLIN HFA 108 (90 Base) MCG/ACT inhaler INHALE 2 PUFFS INTO THE LUNGS EVERY 4 HOURS AS NEEDED FOR WHEEZING OR SHORTNESS OF BREATH   No facility-administered encounter medications on file as of 08/10/2019.

## 2019-08-16 DIAGNOSIS — J3089 Other allergic rhinitis: Secondary | ICD-10-CM | POA: Diagnosis not present

## 2019-08-16 DIAGNOSIS — J301 Allergic rhinitis due to pollen: Secondary | ICD-10-CM | POA: Diagnosis not present

## 2019-08-19 ENCOUNTER — Other Ambulatory Visit: Payer: Self-pay | Admitting: Family Medicine

## 2019-08-23 DIAGNOSIS — J301 Allergic rhinitis due to pollen: Secondary | ICD-10-CM | POA: Diagnosis not present

## 2019-08-23 DIAGNOSIS — J3089 Other allergic rhinitis: Secondary | ICD-10-CM | POA: Diagnosis not present

## 2019-08-26 ENCOUNTER — Ambulatory Visit: Payer: BC Managed Care – PPO | Attending: Internal Medicine

## 2019-08-26 DIAGNOSIS — Z23 Encounter for immunization: Secondary | ICD-10-CM

## 2019-08-26 NOTE — Progress Notes (Signed)
   Covid-19 Vaccination Clinic  Name:  Lonnie Jones    MRN: [id number] DOB: June 30, 1975  08/26/2019  Lonnie Jones was observed post Covid-19 immunization for 30 minutes based on pre-vaccination screening without incident. He was provided with Vaccine Information Sheet and instruction to access the V-Safe system.   Lonnie Jones was instructed to call 911 with any severe reactions post vaccine: Marland Kitchen Difficulty breathing  . Swelling of face and throat  . A fast heartbeat  . A bad rash all over body  . Dizziness and weakness   Immunizations Administered    Name Date Dose VIS Date Route   Pfizer COVID-19 Vaccine 08/26/2019 10:37 AM 0.3 mL 05/13/2019 Intramuscular   Manufacturer: [place]   Lot: [id number]   [place]: [id number]

## 2019-08-30 DIAGNOSIS — J3089 Other allergic rhinitis: Secondary | ICD-10-CM | POA: Diagnosis not present

## 2019-08-30 DIAGNOSIS — J301 Allergic rhinitis due to pollen: Secondary | ICD-10-CM | POA: Diagnosis not present

## 2019-09-06 DIAGNOSIS — J3089 Other allergic rhinitis: Secondary | ICD-10-CM | POA: Diagnosis not present

## 2019-09-06 DIAGNOSIS — J301 Allergic rhinitis due to pollen: Secondary | ICD-10-CM | POA: Diagnosis not present

## 2019-09-13 DIAGNOSIS — J301 Allergic rhinitis due to pollen: Secondary | ICD-10-CM | POA: Diagnosis not present

## 2019-09-13 DIAGNOSIS — J3089 Other allergic rhinitis: Secondary | ICD-10-CM | POA: Diagnosis not present

## 2019-09-19 ENCOUNTER — Ambulatory Visit: Payer: BC Managed Care – PPO | Attending: Internal Medicine

## 2019-09-19 DIAGNOSIS — Z23 Encounter for immunization: Secondary | ICD-10-CM

## 2019-09-19 NOTE — Progress Notes (Signed)
   Covid-19 Vaccination Clinic  Name:  Lonnie Jones    MRN: [id number] DOB: Jun 23, 1975  09/19/2019  [name] was observed post Covid-19 immunization for 15 minutes without incident. He was provided with Vaccine Information Sheet and instruction to access the V-Safe system.   [name] was instructed to call 911 with any severe reactions post vaccine: Marland Kitchen Difficulty breathing  . Swelling of face and throat  . A fast heartbeat  . A bad rash all over body  . Dizziness and weakness   Immunizations Administered    Name Date Dose VIS Date Route   Pfizer COVID-19 Vaccine 09/19/2019  3:10 PM 0.3 mL 07/27/2018 Intramuscular   Manufacturer: [place]   Lot: [id number]   [place]: [id number]

## 2019-09-20 DIAGNOSIS — J3089 Other allergic rhinitis: Secondary | ICD-10-CM | POA: Diagnosis not present

## 2019-09-20 DIAGNOSIS — J301 Allergic rhinitis due to pollen: Secondary | ICD-10-CM | POA: Diagnosis not present

## 2019-09-27 DIAGNOSIS — J3089 Other allergic rhinitis: Secondary | ICD-10-CM | POA: Diagnosis not present

## 2019-09-27 DIAGNOSIS — H1045 Other chronic allergic conjunctivitis: Secondary | ICD-10-CM | POA: Diagnosis not present

## 2019-09-27 DIAGNOSIS — J301 Allergic rhinitis due to pollen: Secondary | ICD-10-CM | POA: Diagnosis not present

## 2019-09-27 DIAGNOSIS — J4520 Mild intermittent asthma, uncomplicated: Secondary | ICD-10-CM | POA: Diagnosis not present

## 2019-10-04 DIAGNOSIS — J301 Allergic rhinitis due to pollen: Secondary | ICD-10-CM | POA: Diagnosis not present

## 2019-10-04 DIAGNOSIS — J3089 Other allergic rhinitis: Secondary | ICD-10-CM | POA: Diagnosis not present

## 2019-10-11 DIAGNOSIS — J301 Allergic rhinitis due to pollen: Secondary | ICD-10-CM | POA: Diagnosis not present

## 2019-10-11 DIAGNOSIS — J3089 Other allergic rhinitis: Secondary | ICD-10-CM | POA: Diagnosis not present

## 2019-10-18 ENCOUNTER — Other Ambulatory Visit: Payer: Self-pay | Admitting: Family Medicine

## 2019-10-18 DIAGNOSIS — J3089 Other allergic rhinitis: Secondary | ICD-10-CM | POA: Diagnosis not present

## 2019-10-18 DIAGNOSIS — J301 Allergic rhinitis due to pollen: Secondary | ICD-10-CM | POA: Diagnosis not present

## 2019-10-18 NOTE — Telephone Encounter (Signed)
Last office visit 08/10/2019 for acute back pain.  Last refilled 07/18/2019 for #30 with 1 refill.  No future appointments with PCP.

## 2019-10-20 ENCOUNTER — Other Ambulatory Visit: Payer: Self-pay

## 2019-10-20 ENCOUNTER — Encounter (HOSPITAL_COMMUNITY): Payer: Self-pay

## 2019-10-20 ENCOUNTER — Ambulatory Visit (HOSPITAL_COMMUNITY)
Admission: EM | Admit: 2019-10-20 | Discharge: 2019-10-20 | Disposition: A | Discharge status: Home / Self Care | Payer: BC Managed Care – PPO

## 2019-10-20 ENCOUNTER — Ambulatory Visit (INDEPENDENT_AMBULATORY_CARE_PROVIDER_SITE_OTHER): Payer: BC Managed Care – PPO

## 2019-10-20 ENCOUNTER — Telehealth: Payer: Self-pay

## 2019-10-20 DIAGNOSIS — R0781 Pleurodynia: Secondary | ICD-10-CM

## 2019-10-20 MED ORDER — KETOROLAC TROMETHAMINE 30 MG/ML IJ SOLN
INTRAMUSCULAR | Status: AC
  Filled 2019-10-20: qty 1

## 2019-10-20 MED ORDER — KETOROLAC TROMETHAMINE 30 MG/ML IJ SOLN
30.0000 mg | Freq: Once | INTRAMUSCULAR | Status: AC
  Administered 2019-10-20: 30 mg via INTRAMUSCULAR

## 2019-10-20 MED ORDER — CYCLOBENZAPRINE HCL 10 MG PO TABS
10.0000 mg | ORAL_TABLET | Freq: Two times a day (BID) | ORAL | 0 refills | Status: DC | PRN
Start: 2019-10-20 — End: 2020-03-22

## 2019-10-20 NOTE — ED Provider Notes (Signed)
[place]    CSN: [id number] Arrival date & time: 10/20/19  1033      History   Chief Complaint Chief Complaint  Patient presents with  . Flank Pain    HPI Lonnie Jones is a 44 y.o. male.   Patient is a 44 year old male that presents today with left rib pain.  Started yesterday while he was at work.  He was in a tight crawl space and was laying on his left side when he moved and heard a pop.  He instantly had pain and trouble breathing.  He was able to complete the rest of the workday but in pain.  He denies any shortness of breath.  He took Tylenol last night and muscle relaxer without much relief.  He did not sleep well.    ROS per HPI      Past Medical History:  Diagnosis Date  . Acute idiopathic gout involving toe 06/22/2017  . Allergic rhinitis due to allergen 08/25/2011  . Allergy   . Asthma   . Diabetes mellitus   . GERD (gastroesophageal reflux disease)   . Hyperlipidemia LDL goal < 70 12/16/2010  . Migraine headache   . Type 2 diabetes mellitus with peripheral neuropathy ([place]) 08/08/2010   Qualifier: Diagnosis of  By: Lorelei Pont MD, [place]      Patient Active Problem List   Diagnosis Date Noted  . Type 2 diabetes mellitus with hyperglycemia, without long-term current use of insulin ([place]) 05/25/2019  . Acute idiopathic gout involving toe 06/22/2017  . Peripheral autonomic neuropathy due to diabetes mellitus ([place]) 02/16/2015  . Allergic rhinitis due to allergen 08/25/2011  . Hyperlipidemia with target LDL less than 70 12/16/2010  . Type 2 diabetes mellitus with peripheral neuropathy ([place]) 08/08/2010  . MIGRAINE HEADACHE 08/08/2010  . ASTHMA 08/08/2010  . GERD 08/08/2010  . Low back pain 08/08/2010    Past Surgical History:  Procedure Laterality Date  . APPENDECTOMY  2000  . CARPAL TUNNEL RELEASE    . KNEE SURGERY  2011       Home Medications    Prior to Admission medications   Medication Sig Start Date End Date Taking? Authorizing  Provider  eszopiclone (LUNESTA) 1 MG TABS tablet TAKE 1 TABLET (1 MG TOTAL) BY MOUTH AT BEDTIME AS NEEDED FOR SLEEP. TAKE IMMEDIATELY BEFORE BEDTIME 10/18/19   Copland, Frederico Hamman, MD  azelastine (ASTELIN) 0.1 % nasal spray INHALE 2 SPRAYS IN EACH NOSTRIL TWICE A DAY 11/12/18   [provider]  BAYER MICROLET LANCETS lancets Use to check blood sugars once daily. E11.42 08/20/16   Copland, Frederico Hamman, MD  Blood Glucose Monitoring Suppl (BAYER CONTOUR NEXT MONITOR) w/Device KIT 1 each by Does not apply route daily. 01/28/16   Copland, Frederico Hamman, MD  Continuous Blood Gluc Receiver (FREESTYLE LIBRE 2 READER) DEVI 1 Device by Does not apply route as directed. 07/07/19   Shamleffer, Melanie Crazier, MD  Continuous Blood Gluc Sensor (FREESTYLE LIBRE 2 SENSOR) MISC 1 Device by Does not apply route as directed. 07/07/19   Shamleffer, Melanie Crazier, MD  cyclobenzaprine (FLEXERIL) 10 MG tablet Take 1 tablet (10 mg total) by mouth 2 (two) times daily as needed for muscle spasms. 10/20/19   Loura Halt A, NP  dapagliflozin propanediol (FARXIGA) 10 MG TABS tablet Take 10 mg by mouth daily before breakfast. 07/26/19   Shamleffer, Melanie Crazier, MD  desloratadine (CLARINEX) 5 MG tablet Take 5 mg by mouth daily. 09/28/19   [provider]  EPINEPHrine 0.3  mg/0.3 mL IJ SOAJ injection See admin instructions. for allergic reaction 02/16/18   [provider]  fluticasone (FLONASE) 50 MCG/ACT nasal spray SPRAY 1 SPRAY INTO EACH NOSTRIL EVERY DAY 12/09/18   [provider]  gabapentin (NEURONTIN) 300 MG capsule TAKE 1 CAPSULE BY MOUTH TWICE A DAY 04/18/19   Copland, Frederico Hamman, MD  glipiZIDE (GLUCOTROL) 5 MG tablet Take 1 tablet (5 mg total) by mouth 2 (two) times daily before a meal. 07/26/19   Shamleffer, Melanie Crazier, MD  glucose blood test strip USE TO CHECK BLOOD SUGARS ONCE DAILY. DX: E11.42 06/23/18   Copland, Frederico Hamman, MD  levocetirizine (XYZAL) 5 MG tablet every evening. 02/16/18   [provider]  meloxicam (MOBIC) 15 MG tablet TAKE 1 TABLET BY MOUTH EVERY DAY 07/18/19   Copland, Frederico Hamman, MD  metFORMIN (GLUCOPHAGE) 1000 MG tablet Take 1 tablet (1,000 mg total) by mouth daily. 07/26/19   Shamleffer, Melanie Crazier, MD  montelukast (SINGULAIR) 10 MG tablet Take 10 mg by mouth daily. 02/16/18   [provider]  olopatadine (PATANOL) 0.1 % ophthalmic solution  09/28/19   [provider]  pioglitazone (ACTOS) 45 MG tablet Take 1 tablet (45 mg total) by mouth daily. 07/26/19   Shamleffer, Melanie Crazier, MD  pravastatin (PRAVACHOL) 20 MG tablet TAKE 1 TABLET BY MOUTH EVERY DAY 01/26/19   Copland, Frederico Hamman, MD  predniSONE (DELTASONE) 20 MG tablet 2 tabs po daily for 5 days, then 1 tab po daily for 5 days 08/10/19   Copland, Frederico Hamman, MD  tiZANidine (ZANAFLEX) 4 MG tablet Take 1 tablet (4 mg total) by mouth at bedtime as needed for muscle spasms. 08/10/19 11/08/19  Copland, Frederico Hamman, MD  triamcinolone cream (KENALOG) 0.1 % Apply 1 application topically 2 (two) times daily. 03/02/19 03/01/20  Owens Loffler, MD    Family History Family History  Problem Relation Age of Onset  . Healthy Mother   . Healthy Father     Social History Social History   Tobacco Use  . Smoking status: Former Smoker    Quit date: 08/19/2001    Years since quitting: 18.1  . Smokeless tobacco: Current User    Types: Snuff  Substance Use Topics  . Alcohol use: No    Alcohol/week: 0.0 standard drinks  . Drug use: No     Allergies   Codeine   Review of Systems Review of Systems   Physical Exam Triage Vital Signs ED Triage Vitals  Enc Vitals Group     BP 10/20/19 1119 125/78     Pulse Rate 10/20/19 1119 78     Resp 10/20/19 1119 19     Temp 10/20/19 1119 98.4 F (36.9 C)     Temp Source 10/20/19 1119 Oral     SpO2 10/20/19 1119 96 %     Weight 10/20/19 1116 238 lb (108 kg)     Height --      Head Circumference --      Peak Flow --      Pain Score 10/20/19 1116 3     Pain  Loc --      Pain Edu? --      Excl. in [place]? --    No data found.  Updated Vital Signs BP 125/78 (BP Location: Right Arm)   Pulse 78   Temp 98.4 F (36.9 C) (Oral)   Resp 19   Wt 238 lb (108 kg)   SpO2 96%   BMI 36.19 kg/m   Visual Acuity Right Eye  Distance:   Left Eye Distance:   Bilateral Distance:    Right Eye Near:   Left Eye Near:    Bilateral Near:     Physical Exam Vitals and nursing note reviewed.  Constitutional:      Appearance: Normal appearance.  HENT:     Head: Normocephalic and atraumatic.     Nose: Nose normal.  Eyes:     Conjunctiva/sclera: Conjunctivae normal.  Cardiovascular:     Rate and Rhythm: Normal rate and regular rhythm.     Heart sounds: Normal heart sounds.  Pulmonary:     Effort: Pulmonary effort is normal.     Breath sounds: Normal breath sounds.  Chest:     Chest wall: Tenderness present. No swelling.       Comments: No swelling, bruising or deformities.  Tender to palpation. Musculoskeletal:        General: Normal range of motion.     Cervical back: Normal range of motion.  Skin:    General: Skin is warm and dry.  Neurological:     Mental Status: He is alert.  Psychiatric:        Mood and Affect: Mood normal.      UC Treatments / Results  Labs (all labs ordered are listed, but only abnormal results are displayed) Labs Reviewed - No data to display  EKG   Radiology DG Ribs Unilateral W/Chest Left  Result Date: 10/20/2019 CLINICAL DATA:  Left axillary rib pain EXAM: LEFT RIBS AND CHEST - 3+ VIEW COMPARISON:  01/31/2007 FINDINGS: No fracture or other bone lesions are seen involving the ribs. There is no evidence of pneumothorax or pleural effusion. Both lungs are clear. Heart size and mediastinal contours are within normal limits. IMPRESSION: Negative. Electronically Signed   By: Rolm Baptise M.D.   On: 10/20/2019 11:58    Procedures Procedures (including critical care time)  Medications Ordered in UC Medications    ketorolac (TORADOL) 30 MG/ML injection 30 mg (30 mg Intramuscular Given 10/20/19 1213)    Initial Impression / Assessment and Plan / UC Course  I have reviewed the triage vital signs and the nursing notes.  Pertinent labs & imaging results that were available during my care of the patient were reviewed by me and considered in my medical decision making (see chart for details).     Rib pain X-ray without any acute fractures.  Most likely Muscle strain Toradol given here for acute pain.  We will have him use Flexeril as needed for muscle relaxant. Recommend alternate heat and ice Follow up as needed for continued or worsening symptoms  Final Clinical Impressions(s) / UC Diagnoses   Final diagnoses:  Rib pain     Discharge Instructions     Your x-ray was normal.  This is most likely muscle strain Toradol given here for pain. You can alternate heat and ice to the area.  We will try different muscle x-ray to see if this helps. Follow up as needed for continued or worsening symptoms      ED Prescriptions    Medication Sig Dispense Auth. Provider   cyclobenzaprine (FLEXERIL) 10 MG tablet Take 1 tablet (10 mg total) by mouth 2 (two) times daily as needed for muscle spasms. 20 tablet Jashawna Reever A, NP     I have reviewed the PDMP during this encounter.   Orvan July, NP 10/20/19 1223

## 2019-10-20 NOTE — Telephone Encounter (Signed)
I am not exactly sure who to report this to, but this could very easily have been handled in our office tomorrow.  Even if I am completely booked, anyone in our office could handle this even if there are multiple fractured ribs.

## 2019-10-20 NOTE — Telephone Encounter (Signed)
[facility] [facility] - Client TELEPHONE ADVICE RECORD [facility] Patient Name: KENTREZ CIARAMELLA Gender: Male DOB: 04-08-1976 Age: 44 Y 89 M 3 D Return Phone Number: [phone] (Primary), [phone] (Secondary), [phone] (Alternate) Address[place] [address] Client [facility] - Client Client Site [place] - Day Physician Copland, Frederico Hamman - MD Contact Type Call Who Is Calling Patient / Member / Family / Caregiver Call Type Triage / Clinical Caller Name Lukasz Moravec Relationship To Patient Spouse Return Phone Number [phone] (Primary) Chief Complaint BREATHING - shortness of breath or sounds breathless Reason for Call Symptomatic / Request for [place] states her husband heard a pop when he bent over yesterday. Now it hurts when he breathes. His rib feels broken. Translation No Nurse Assessment Nurse: Leilani Merl, RN, Heather Date/Time (Eastern Time): 10/20/2019 8:13:20 AM Confirm and document reason for call. If symptomatic, describe symptoms. ---Caller states that he was laying on a mound of dirt in a crawl space and he felt a pop in his ribs and he has had pain since. Has the patient had close contact with a person known or suspected to have the novel coronavirus illness OR traveled / lives in area with major community spread (including international travel) in the last 14 days from the onset of symptoms? * If Asymptomatic, screen for exposure and travel within the last 14 days. ---No Does the patient have any new or worsening symptoms? ---Yes Will a triage be completed? ---Yes Related visit to physician within the last 2 weeks? ---No Does the PT have any chronic conditions? (i.e. diabetes, asthma, this includes High risk factors for pregnancy, etc.) ---Yes List chronic conditions. ---DM, asthma Is this a behavioral health or substance abuse call?  ---No Guidelines Guideline Title Affirmed Question Affirmed Notes Nurse Date/Time (Eastern Time) Chest Injury [1] Chest wall swelling, bruise or pain AND [2] present < 7 days Standifer, RN, Nira Conn 10/20/2019 8:14:06 AM PLEASE NOTE: All timestamps contained within this report are represented as [place] Standard Time. CONFIDENTIALTY NOTICE: This fax transmission is intended only for the addressee. It contains information that is legally privileged, confidential or otherwise protected from use or disclosure. If you are not the intended recipient, you are strictly prohibited from reviewing, disclosing, copying using or disseminating any of this information or taking any action in reliance on or regarding this information. If you have received this fax in error, please notify us immediately by telephone so that we can arrange for its return to [place]. Phone: [phone], Toll-Free: [phone], Fax: [phone] Page: 2 of 2 Call Id: [id number] [place]. Time Eilene Ghazi Time) Disposition Final User 10/20/2019 8:11:25 AM Send to Urgent Queue Izola Price 10/20/2019 8:19:31 AM See PCP within 24 Hours Yes Standifer, RN, Nira Conn Disposition Overriden: Home Care Override Reason: Patient's symptoms need a higher level of care Caller Disagree/Comply Comply Caller Understands Yes PreDisposition Call Doctor Care Advice Given Per Guideline SEE PCP WITHIN 24 HOURS: * IF OFFICE WILL BE OPEN: You need to be seen within the next 24 hours. Call your doctor (or NP/PA) when the office opens and make an appointment. CALL BACK IF: * You become worse. CARE ADVICE given per Chest Injury (Adult) guideline. Comments User: Ave Filter, RN Date/Time Eilene Ghazi Time): 10/20/2019 8:20:31 AM Called office backline, [phone], they do not have any appts for today. Informed caller to go to [facility]. Caller verbalizes understanding. Referrals GO TO FACILITY UNDECIDED

## 2019-10-20 NOTE — Discharge Instructions (Addendum)
Your x-ray was normal.  This is most likely muscle strain Toradol given here for pain. You can alternate heat and ice to the area.  We will try different muscle x-ray to see if this helps. Follow up as needed for continued or worsening symptoms

## 2019-10-20 NOTE — Telephone Encounter (Signed)
I will forward this to [place] who communicates with Access Nurse to review any breakdowns.  Thanks for bringing this to my attention.

## 2019-10-20 NOTE — ED Triage Notes (Signed)
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 Pt has taken Tylenol to relieve discomfort.

## 2019-10-25 DIAGNOSIS — J301 Allergic rhinitis due to pollen: Secondary | ICD-10-CM | POA: Diagnosis not present

## 2019-10-25 DIAGNOSIS — J3089 Other allergic rhinitis: Secondary | ICD-10-CM | POA: Diagnosis not present

## 2019-11-01 ENCOUNTER — Other Ambulatory Visit: Payer: Self-pay | Admitting: Family Medicine

## 2019-11-01 DIAGNOSIS — J301 Allergic rhinitis due to pollen: Secondary | ICD-10-CM | POA: Diagnosis not present

## 2019-11-01 DIAGNOSIS — J3089 Other allergic rhinitis: Secondary | ICD-10-CM | POA: Diagnosis not present

## 2019-11-01 NOTE — Telephone Encounter (Signed)
Last office visit 08/10/2019 for acute back pain.  Last refilled 03/102021 for #30 with 2 refills.  No future appointments.

## 2019-11-03 ENCOUNTER — Encounter: Payer: Self-pay | Admitting: Internal Medicine

## 2019-11-04 ENCOUNTER — Other Ambulatory Visit: Payer: Self-pay | Admitting: Family Medicine

## 2019-11-04 NOTE — Telephone Encounter (Signed)
Last office visit 08/10/2019 for back pain.  Last refilled Meloxicam 07/18/2019 for #90 with 1 refill.  Gabapentin 04/08/2019 for #180 with 1 refill.  No future appointments.

## 2019-11-04 NOTE — Telephone Encounter (Signed)
[place] notified as instructed by telephone.  Patient states understanding.

## 2019-11-04 NOTE — Telephone Encounter (Signed)
Can you let him know that I changed the 300 mg gabapentin for 400 mg  Looks like insurance has strong preference for this, and an increase for 300 mg to 400 mg will not make really any difference.

## 2019-11-08 DIAGNOSIS — J301 Allergic rhinitis due to pollen: Secondary | ICD-10-CM | POA: Diagnosis not present

## 2019-11-08 DIAGNOSIS — J3089 Other allergic rhinitis: Secondary | ICD-10-CM | POA: Diagnosis not present

## 2019-11-21 ENCOUNTER — Encounter: Payer: Self-pay | Admitting: Internal Medicine

## 2019-11-21 ENCOUNTER — Ambulatory Visit (INDEPENDENT_AMBULATORY_CARE_PROVIDER_SITE_OTHER): Payer: BC Managed Care – PPO | Admitting: Internal Medicine

## 2019-11-21 ENCOUNTER — Other Ambulatory Visit: Payer: Self-pay

## 2019-11-21 VITALS — BP 132/68 | HR 100 | Ht 68.0 in | Wt 233.4 lb

## 2019-11-21 DIAGNOSIS — E1142 Type 2 diabetes mellitus with diabetic polyneuropathy: Secondary | ICD-10-CM

## 2019-11-21 LAB — POCT GLYCOSYLATED HEMOGLOBIN (HGB A1C): Hemoglobin A1C: 6 % — AB (ref 4.0–5.6)

## 2019-11-21 MED ORDER — GLIPIZIDE 5 MG PO TABS: 5.0000 mg | ORAL_TABLET | Freq: Every day | ORAL | 3 refills | Status: DC

## 2019-11-21 NOTE — Patient Instructions (Signed)
-   Decrease Glipizide 5 mg to 1 tablet before Breakfast - Continue Metformin 1000 mg once daily  - Continue Farxiga 10 mg tablets - Continue Pioglitazone 45 mg daily          HOW TO TREAT LOW BLOOD SUGARS (Blood sugar LESS THAN 70 MG/DL)  Please follow the RULE OF 15 for the treatment of hypoglycemia treatment (when your (blood sugars are less than 70 mg/dL)    STEP 1: Take 15 grams of carbohydrates when your blood sugar is low, which includes:   3-4 GLUCOSE TABS  OR  3-4 OZ OF JUICE OR REGULAR SODA OR  ONE TUBE OF GLUCOSE GEL     STEP 2: RECHECK blood sugar in 15 MINUTES STEP 3: If your blood sugar is still low at the 15 minute recheck --> then, go back to STEP 1 and treat AGAIN with another 15 grams of carbohydrates.

## 2019-11-21 NOTE — Progress Notes (Signed)
Name: Lonnie Jones  Age/ Sex: 44 y.o., male   MRN/ DOB: [id number], Sep 18, 1975     PCP: Owens Loffler, MD   Reason for Endocrinology Evaluation: Type 2 Diabetes Mellitus  Initial Endocrine Consultative Visit: 05/25/2019    PATIENT IDENTIFIER: Lonnie Jones is a 44 y.o. male with a past medical history of T2DM, Asthma,and  Dyslipidemia. The patient has followed with Endocrinology clinic since 05/25/2019 for consultative assistance with management of his diabetes.  DIABETIC HISTORY:  Lonnie Jones was diagnosed with T2DM in 2016, he has been on oral glycemic agents since his diagnosis. No prior use of insulin. His hemoglobin A1c has ranged from  6.9% in 2019, peaking at 8.5% in 07/2017.  On his initial visit to our clinic his A1c was 7.5%. He was on metformin, glipizide and pioglitazone. We reduced Metformin due to GI side effects and started farxiga.   Works in sewer and drain cleaning  SUBJECTIVE:   During the last visit (07/26/2019): A1c 6.4 %. We reduced metformin due to GI side effects, we continued glipizide and pioglitazone and added farxiga.    Today (11/21/2019): Lonnie Jones is here for a follow up on diabetes management.  He checks his blood sugars a few times a day through the CGM .The patient has not had hypoglycemic episodes since the last clinic visit.   Denies any complaints  HOME DIABETES REGIMEN:  Metformin 1000 mg daily  Farxiga 10 mg daily  Glipizide 5 mg BID Pioglitazone 45 mg daily      Statin: Yes ACE-I/ARB: Yes   CONTINUOUS GLUCOSE MONITORING RECORD INTERPRETATION    Dates of Recording: 6/8-6/21/2021  Sensor description: freestyle libre  Results statistics:   CGM use % of time 96  Average and SD 134/21.7  Time in range    93    %  % Time Above 180 7  % Time above 250 0  % Time Below target 0    Glycemic patterns summary:  Glycemic control is within goal  Hyperglycemic episodes  Rarely post-prandial  Hypoglycemic episodes  occurred n/a  Overnight periods: stable        DIABETIC COMPLICATIONS: Microvascular complications:   Neuropathy  Denies: CKD, retinopathy   Last eye exam: Completed 05/2019  Macrovascular complications:   Denies: CAD, PVD, CVA   HISTORY:  Past Medical History:  Past Medical History:  Diagnosis Date  . Acute idiopathic gout involving toe 06/22/2017  . Allergic rhinitis due to allergen 08/25/2011  . Allergy   . Asthma   . Diabetes mellitus   . GERD (gastroesophageal reflux disease)   . Hyperlipidemia LDL goal < 70 12/16/2010  . Migraine headache   . Type 2 diabetes mellitus with peripheral neuropathy ([place]) 08/08/2010   Qualifier: Diagnosis of  By: Lorelei Pont MD, Frederico Hamman     Past Surgical History:  Past Surgical History:  Procedure Laterality Date  . APPENDECTOMY  2000  . CARPAL TUNNEL RELEASE    . KNEE SURGERY  2011    Social History:  reports that he quit smoking about 18 years ago. His smokeless tobacco use includes snuff. He reports that he does not drink alcohol and does not use drugs. Family History:  Family History  Problem Relation Age of Onset  . Healthy Mother   . Healthy Father      HOME MEDICATIONS: Allergies as of 11/21/2019      Reactions   Codeine       Medication List  Accurate as of November 21, 2019  2:52 PM. If you have any questions, ask your nurse or doctor.        STOP taking these medications   levocetirizine 5 MG tablet Commonly known as: XYZAL Stopped by: Dorita Sciara, MD   olopatadine 0.1 % ophthalmic solution Commonly known as: PATANOL Stopped by: Dorita Sciara, MD   predniSONE 20 MG tablet Commonly known as: DELTASONE Stopped by: Dorita Sciara, MD     TAKE these medications   azelastine 0.1 % nasal spray Commonly known as: ASTELIN INHALE 2 SPRAYS IN EACH NOSTRIL TWICE A DAY   Bayer Contour Next Monitor w/Device Kit 1 each by Does not apply route daily.   Bayer Microlet Lancets  lancets Use to check blood sugars once daily. E11.42   cyclobenzaprine 10 MG tablet Commonly known as: FLEXERIL Take 1 tablet (10 mg total) by mouth 2 (two) times daily as needed for muscle spasms.   desloratadine 5 MG tablet Commonly known as: CLARINEX Take 5 mg by mouth daily.   EPINEPHrine 0.3 mg/0.3 mL Soaj injection Commonly known as: EPI-PEN See admin instructions. for allergic reaction   eszopiclone 1 MG Tabs tablet Commonly known as: LUNESTA TAKE 1 TABLET (1 MG TOTAL) BY MOUTH AT BEDTIME AS NEEDED FOR SLEEP. TAKE IMMEDIATELY BEFORE BEDTIME   Farxiga 10 MG Tabs tablet Generic drug: dapagliflozin propanediol Take 10 mg by mouth daily before breakfast.   fluticasone 50 MCG/ACT nasal spray Commonly known as: FLONASE SPRAY 1 SPRAY INTO EACH NOSTRIL EVERY DAY   FreeStyle Libre 2 Reader Devi 1 Device by Does not apply route as directed.   FreeStyle Libre 2 Sensor Misc 1 Device by Does not apply route as directed.   gabapentin 400 MG capsule Commonly known as: Neurontin Take 1 capsule (400 mg total) by mouth 3 (three) times daily. What changed: when to take this   glipiZIDE 5 MG tablet Commonly known as: GLUCOTROL Take 1 tablet (5 mg total) by mouth 2 (two) times daily before a meal.   glucose blood test strip USE TO CHECK BLOOD SUGARS ONCE DAILY. DX: E11.42   meloxicam 15 MG tablet Commonly known as: MOBIC TAKE 1 TABLET BY MOUTH EVERY DAY   metFORMIN 1000 MG tablet Commonly known as: GLUCOPHAGE Take 1 tablet (1,000 mg total) by mouth daily.   montelukast 10 MG tablet Commonly known as: SINGULAIR Take 10 mg by mouth daily.   pioglitazone 45 MG tablet Commonly known as: ACTOS Take 1 tablet (45 mg total) by mouth daily.   pravastatin 20 MG tablet Commonly known as: PRAVACHOL TAKE 1 TABLET BY MOUTH EVERY DAY   tiZANidine 4 MG tablet Commonly known as: ZANAFLEX TAKE 1 TABLET (4 MG TOTAL) BY MOUTH AT BEDTIME AS NEEDED FOR MUSCLE SPASMS.   triamcinolone  cream 0.1 % Commonly known as: KENALOG Apply 1 application topically 2 (two) times daily.        OBJECTIVE:   Vital Signs: BP 132/68   Pulse 100   Ht '5\' 8"'  (1.727 m)   Wt 233 lb 6.4 oz (105.9 kg)   SpO2 95%   BMI 35.49 kg/m   Wt Readings from Last 3 Encounters:  11/21/19 233 lb 6.4 oz (105.9 kg)  10/20/19 238 lb (108 kg)  08/10/19 239 lb 4 oz (108.5 kg)     Exam: General: Pt appears well and is in NAD  Neck: General: Supple without adenopathy. Thyroid: Thyroid size normal.  No goiter or nodules appreciated. No  thyroid bruit.  Lungs: Clear with good BS bilat with no rales, rhonchi, or wheezes  Heart: RRR with normal S1 and S2 and no gallops; no murmurs; no rub  Abdomen: Normoactive bowel sounds, soft, nontender, without masses or organomegaly palpable  Extremities: No pretibial edema.   Neuro: MS is good with appropriate affect, pt is alert and Ox3    DM foot exam: 11/21/2019 The skin of the feet is intact without sores or ulcerations. The pedal pulses are 1+ on right and 1+ on left. The sensation is intact to a screening 5.07, 10 gram monofilament bilaterally    DATA REVIEWED:  Lab Results  Component Value Date   HGBA1C 6.0 (A) 11/21/2019   HGBA1C 6.4 (A) 07/26/2019   HGBA1C 7.5 (A) 05/25/2019   Lab Results  Component Value Date   MICROALBUR 8.0 (H) 11/12/2018   LDLCALC 91 11/12/2018   CREATININE 0.78 05/31/2019   Lab Results  Component Value Date   MICRALBCREAT 10.9 11/12/2018     Lab Results  Component Value Date   CHOL 163 11/12/2018   HDL 32.50 (L) 11/12/2018   LDLCALC 91 11/12/2018   LDLDIRECT 88.0 08/15/2016   TRIG 197.0 (H) 11/12/2018   CHOLHDL 5 11/12/2018         ASSESSMENT / PLAN / RECOMMENDATIONS:   1) Type 2 Diabetes Mellitus, Optimally controlled, With Neuropathic complications - Most recent A1c of 6.0%. Goal A1c < 7.0 %.    -I have praised the patient on optimal glucose control and lifestyle changes, he continues to lose weight   - Based on his low A1c I am going to reduce his Glipizide as below for now     MEDICATIONS: - Decrease Glipizide 5 mg, to 1 tablet before Breakfast  - Continue Metformin 1000 mg once daily  - Continue Farxiga 10 mg tablets - Continue Pioglitazone 45 mg daily   EDUCATION / INSTRUCTIONS:  BG monitoring instructions: Patient is instructed to check his blood sugars 3 times a day.  Call [facility] if: BG persistently < 70 or > 300. . I reviewed the Rule of 15 for the treatment of hypoglycemia in detail with the patient. Literature supplied.    F/U in 4 months   Signed electronically by: Mack Guise, MD  [facility] Endocrinology  [facility] [address] [place], [place] [address] Phone: [phone] FAX: [phone]   CC: Owens Loffler, [place] [address] Phone: [phone]  Fax: [phone]  Return to Endocrinology clinic as below: No future appointments.

## 2019-11-22 DIAGNOSIS — J3089 Other allergic rhinitis: Secondary | ICD-10-CM | POA: Diagnosis not present

## 2019-11-22 DIAGNOSIS — J301 Allergic rhinitis due to pollen: Secondary | ICD-10-CM | POA: Diagnosis not present

## 2019-11-23 ENCOUNTER — Ambulatory Visit: Payer: BC Managed Care – PPO | Admitting: Internal Medicine

## 2019-11-29 DIAGNOSIS — J301 Allergic rhinitis due to pollen: Secondary | ICD-10-CM | POA: Diagnosis not present

## 2019-11-29 DIAGNOSIS — J3089 Other allergic rhinitis: Secondary | ICD-10-CM | POA: Diagnosis not present

## 2019-12-06 DIAGNOSIS — J301 Allergic rhinitis due to pollen: Secondary | ICD-10-CM | POA: Diagnosis not present

## 2019-12-06 DIAGNOSIS — J3089 Other allergic rhinitis: Secondary | ICD-10-CM | POA: Diagnosis not present

## 2019-12-13 DIAGNOSIS — J301 Allergic rhinitis due to pollen: Secondary | ICD-10-CM | POA: Diagnosis not present

## 2019-12-13 DIAGNOSIS — J3089 Other allergic rhinitis: Secondary | ICD-10-CM | POA: Diagnosis not present

## 2019-12-22 DIAGNOSIS — J301 Allergic rhinitis due to pollen: Secondary | ICD-10-CM | POA: Diagnosis not present

## 2019-12-22 DIAGNOSIS — J3089 Other allergic rhinitis: Secondary | ICD-10-CM | POA: Diagnosis not present

## 2019-12-27 DIAGNOSIS — J301 Allergic rhinitis due to pollen: Secondary | ICD-10-CM | POA: Diagnosis not present

## 2019-12-27 DIAGNOSIS — J3089 Other allergic rhinitis: Secondary | ICD-10-CM | POA: Diagnosis not present

## 2019-12-28 DIAGNOSIS — J301 Allergic rhinitis due to pollen: Secondary | ICD-10-CM | POA: Diagnosis not present

## 2019-12-29 DIAGNOSIS — J3089 Other allergic rhinitis: Secondary | ICD-10-CM | POA: Diagnosis not present

## 2020-01-03 DIAGNOSIS — J3089 Other allergic rhinitis: Secondary | ICD-10-CM | POA: Diagnosis not present

## 2020-01-03 DIAGNOSIS — J301 Allergic rhinitis due to pollen: Secondary | ICD-10-CM | POA: Diagnosis not present

## 2020-01-10 ENCOUNTER — Encounter: Payer: Self-pay | Admitting: Family Medicine

## 2020-01-10 DIAGNOSIS — J3089 Other allergic rhinitis: Secondary | ICD-10-CM | POA: Diagnosis not present

## 2020-01-10 DIAGNOSIS — J301 Allergic rhinitis due to pollen: Secondary | ICD-10-CM | POA: Diagnosis not present

## 2020-01-11 DIAGNOSIS — U071 COVID-19: Secondary | ICD-10-CM | POA: Diagnosis not present

## 2020-01-11 DIAGNOSIS — Z20828 Contact with and (suspected) exposure to other viral communicable diseases: Secondary | ICD-10-CM | POA: Diagnosis not present

## 2020-01-17 DIAGNOSIS — J301 Allergic rhinitis due to pollen: Secondary | ICD-10-CM | POA: Diagnosis not present

## 2020-01-17 DIAGNOSIS — J3089 Other allergic rhinitis: Secondary | ICD-10-CM | POA: Diagnosis not present

## 2020-01-24 DIAGNOSIS — J301 Allergic rhinitis due to pollen: Secondary | ICD-10-CM | POA: Diagnosis not present

## 2020-01-24 DIAGNOSIS — J3089 Other allergic rhinitis: Secondary | ICD-10-CM | POA: Diagnosis not present

## 2020-01-31 ENCOUNTER — Other Ambulatory Visit: Payer: Self-pay | Admitting: Family Medicine

## 2020-01-31 DIAGNOSIS — J301 Allergic rhinitis due to pollen: Secondary | ICD-10-CM | POA: Diagnosis not present

## 2020-01-31 DIAGNOSIS — J3089 Other allergic rhinitis: Secondary | ICD-10-CM | POA: Diagnosis not present

## 2020-01-31 NOTE — Telephone Encounter (Signed)
Last office visit 08/10/2019 for acute back pain.  Tizanidine is not on current medication list.  Flexeril is.  No future appointments.

## 2020-02-07 DIAGNOSIS — J301 Allergic rhinitis due to pollen: Secondary | ICD-10-CM | POA: Diagnosis not present

## 2020-02-07 DIAGNOSIS — J3089 Other allergic rhinitis: Secondary | ICD-10-CM | POA: Diagnosis not present

## 2020-02-14 DIAGNOSIS — J301 Allergic rhinitis due to pollen: Secondary | ICD-10-CM | POA: Diagnosis not present

## 2020-02-14 DIAGNOSIS — J3089 Other allergic rhinitis: Secondary | ICD-10-CM | POA: Diagnosis not present

## 2020-02-14 DIAGNOSIS — E119 Type 2 diabetes mellitus without complications: Secondary | ICD-10-CM | POA: Diagnosis not present

## 2020-02-14 LAB — HM DIABETES EYE EXAM

## 2020-02-21 ENCOUNTER — Other Ambulatory Visit: Payer: Self-pay | Admitting: Family Medicine

## 2020-02-21 DIAGNOSIS — J301 Allergic rhinitis due to pollen: Secondary | ICD-10-CM | POA: Diagnosis not present

## 2020-02-21 DIAGNOSIS — J3089 Other allergic rhinitis: Secondary | ICD-10-CM | POA: Diagnosis not present

## 2020-02-21 MED ORDER — PRAVASTATIN SODIUM 20 MG PO TABS: 20.0000 mg | ORAL_TABLET | Freq: Every day | ORAL | 0 refills | Status: DC

## 2020-02-27 ENCOUNTER — Encounter: Payer: Self-pay | Admitting: Internal Medicine

## 2020-02-27 ENCOUNTER — Other Ambulatory Visit: Payer: Self-pay | Admitting: Family Medicine

## 2020-02-27 NOTE — Telephone Encounter (Signed)
Last office visit 08/10/2019 for back pain.  Last refilled 10/18/2019 for #30 with 1 refill.  No future appointments with PCP.

## 2020-02-28 DIAGNOSIS — J301 Allergic rhinitis due to pollen: Secondary | ICD-10-CM | POA: Diagnosis not present

## 2020-02-28 DIAGNOSIS — J3089 Other allergic rhinitis: Secondary | ICD-10-CM | POA: Diagnosis not present

## 2020-02-28 MED ORDER — GLUCOSE BLOOD VI STRP: ORAL_STRIP | 3 refills | Status: DC

## 2020-03-06 DIAGNOSIS — J3089 Other allergic rhinitis: Secondary | ICD-10-CM | POA: Diagnosis not present

## 2020-03-06 DIAGNOSIS — J301 Allergic rhinitis due to pollen: Secondary | ICD-10-CM | POA: Diagnosis not present

## 2020-03-13 DIAGNOSIS — J3089 Other allergic rhinitis: Secondary | ICD-10-CM | POA: Diagnosis not present

## 2020-03-13 DIAGNOSIS — J301 Allergic rhinitis due to pollen: Secondary | ICD-10-CM | POA: Diagnosis not present

## 2020-03-20 DIAGNOSIS — J3089 Other allergic rhinitis: Secondary | ICD-10-CM | POA: Diagnosis not present

## 2020-03-20 DIAGNOSIS — J301 Allergic rhinitis due to pollen: Secondary | ICD-10-CM | POA: Diagnosis not present

## 2020-03-20 DIAGNOSIS — J3081 Allergic rhinitis due to animal (cat) (dog) hair and dander: Secondary | ICD-10-CM | POA: Diagnosis not present

## 2020-03-22 ENCOUNTER — Encounter: Payer: Self-pay | Admitting: Internal Medicine

## 2020-03-22 ENCOUNTER — Ambulatory Visit: Payer: BC Managed Care – PPO | Admitting: Internal Medicine

## 2020-03-22 ENCOUNTER — Other Ambulatory Visit: Payer: Self-pay

## 2020-03-22 VITALS — BP 134/84 | HR 88 | Temp 96.8°F | Ht 68.0 in | Wt 234.2 lb

## 2020-03-22 DIAGNOSIS — E1142 Type 2 diabetes mellitus with diabetic polyneuropathy: Secondary | ICD-10-CM | POA: Diagnosis not present

## 2020-03-22 LAB — POCT GLYCOSYLATED HEMOGLOBIN (HGB A1C): Hemoglobin A1C: 6.3 % — AB (ref 4.0–5.6)

## 2020-03-22 MED ORDER — METFORMIN HCL ER 500 MG PO TB24: 1000.0000 mg | ORAL_TABLET | Freq: Every day | ORAL | 1 refills | Status: DC

## 2020-03-22 MED ORDER — GLIPIZIDE 5 MG PO TABS: 5.0000 mg | ORAL_TABLET | Freq: Every day | ORAL | 3 refills | Status: DC

## 2020-03-22 NOTE — Patient Instructions (Addendum)
-   Continue  Glipizide 5 mg  1 tablet before Supper  - Continue Metformin 1000 mg once daily  - Continue Farxiga 10 mg tablets - Continue Pioglitazone 45 mg daily          HOW TO TREAT LOW BLOOD SUGARS (Blood sugar LESS THAN 70 MG/DL)  Please follow the RULE OF 15 for the treatment of hypoglycemia treatment (when your (blood sugars are less than 70 mg/dL)    STEP 1: Take 15 grams of carbohydrates when your blood sugar is low, which includes:   3-4 GLUCOSE TABS  OR  3-4 OZ OF JUICE OR REGULAR SODA OR  ONE TUBE OF GLUCOSE GEL     STEP 2: RECHECK blood sugar in 15 MINUTES STEP 3: If your blood sugar is still low at the 15 minute recheck --> then, go back to STEP 1 and treat AGAIN with another 15 grams of carbohydrates.

## 2020-03-22 NOTE — Progress Notes (Signed)
Name: Lonnie Jones  Age/ Sex: 44 y.o., male   MRN/ DOB: [id number], 29-Jul-1975     PCP: Owens Loffler, MD   Reason for Endocrinology Evaluation: Type 2 Diabetes Mellitus  Initial Endocrine Consultative Visit: 05/25/2019    PATIENT IDENTIFIER: [name] is a 44 y.o. male with a past medical history of T2DM, Asthma,and  Dyslipidemia. The patient has followed with Endocrinology clinic since 05/25/2019 for consultative assistance with management of his diabetes.  DIABETIC HISTORY:  Lonnie Jones was diagnosed with T2DM in 2016, he has been on oral glycemic agents since his diagnosis. No prior use of insulin. His hemoglobin A1c has ranged from  6.9% in 2019, peaking at 8.5% in 07/2017.  On his initial visit to our clinic his A1c was 7.5%. He was on metformin, glipizide and pioglitazone. We reduced Metformin due to GI side effects and started farxiga.   Works in sewer and drain cleaning  SUBJECTIVE:   During the last visit (11/21/2019): A1c 6.0 %. We reduced Glipizide, pioglitazone , metformin and and farxiga.    Today (03/22/2020): Lonnie Jones is here for a follow up on diabetes management.  He checks his blood sugars a few times a day through the CGM .The patient has not had hypoglycemic episodes since the last clinic visit.   Denies any complaints  Denies nausea or diarrhea      HOME DIABETES REGIMEN:  Metformin 1000 mg daily  Farxiga 10 mg daily  Glipizide 5 mg daily Pioglitazone 45 mg daily      Statin: Yes ACE-I/ARB: Yes   CONTINUOUS GLUCOSE MONITORING RECORD INTERPRETATION    Dates of Recording 10/8-10/21/2021  Sensor description: freestyle libre  Results statistics:   CGM use % of time 47  Average and SD 149/29.5  Time in range    81  %  % Time Above 180 14  % Time above 250 3  % Time Below target 2    Glycemic patterns summary:  Glycemic control is within goal except after dinner  Hyperglycemic episodes Post supper  Hypoglycemic episodes  occurred during the day  Overnight periods: stable        DIABETIC COMPLICATIONS: Microvascular complications:   Neuropathy  Denies: CKD, retinopathy   Last eye exam: Completed 02/2020  Macrovascular complications:   Denies: CAD, PVD, CVA   HISTORY:  Past Medical History:  Past Medical History:  Diagnosis Date  . Acute idiopathic gout involving toe 06/22/2017  . Allergic rhinitis due to allergen 08/25/2011  . Allergy   . Asthma   . Diabetes mellitus   . GERD (gastroesophageal reflux disease)   . Hyperlipidemia LDL goal < 70 12/16/2010  . Migraine headache   . Type 2 diabetes mellitus with peripheral neuropathy ([place]) 08/08/2010   Qualifier: Diagnosis of  By: Lorelei Pont MD, Frederico Hamman     Past Surgical History:  Past Surgical History:  Procedure Laterality Date  . APPENDECTOMY  2000  . CARPAL TUNNEL RELEASE    . KNEE SURGERY  2011    Social History:  reports that he quit smoking about 18 years ago. His smokeless tobacco use includes snuff. He reports that he does not drink alcohol and does not use drugs. Family History:  Family History  Problem Relation Age of Onset  . Healthy Mother   . Healthy Father      HOME MEDICATIONS: Allergies as of 03/22/2020      Reactions   Codeine       Medication List  Accurate as of March 22, 2020 10:24 AM. If you have any questions, ask your nurse or doctor.        STOP taking these medications   Bayer Contour Next Monitor w/Device Kit Stopped by: Dorita Sciara, MD   Bayer Microlet Lancets lancets Stopped by: Dorita Sciara, MD   cyclobenzaprine 10 MG tablet Commonly known as: FLEXERIL Stopped by: Dorita Sciara, MD   desloratadine 5 MG tablet Commonly known as: CLARINEX Stopped by: Dorita Sciara, MD     TAKE these medications   azelastine 0.1 % nasal spray Commonly known as: ASTELIN INHALE 2 SPRAYS IN EACH NOSTRIL TWICE A DAY   EPINEPHrine 0.3 mg/0.3 mL Soaj  injection Commonly known as: EPI-PEN See admin instructions. for allergic reaction   eszopiclone 1 MG Tabs tablet Commonly known as: LUNESTA TAKE 1 TABLET BY MOUTH AT BEDTIME AS NEEDED FOR SLEEP. TAKE IMMEDIATELY BEFORE BEDTIME   Farxiga 10 MG Tabs tablet Generic drug: dapagliflozin propanediol Take 10 mg by mouth daily before breakfast.   fluticasone 50 MCG/ACT nasal spray Commonly known as: FLONASE SPRAY 1 SPRAY INTO EACH NOSTRIL EVERY DAY   FreeStyle Libre 2 Reader Devi 1 Device by Does not apply route as directed.   FreeStyle Libre 2 Sensor Misc 1 Device by Does not apply route as directed.   gabapentin 400 MG capsule Commonly known as: Neurontin Take 1 capsule (400 mg total) by mouth 3 (three) times daily. What changed: when to take this   glipiZIDE 5 MG tablet Commonly known as: GLUCOTROL Take 1 tablet (5 mg total) by mouth daily before supper. What changed: when to take this Changed by: Dorita Sciara, MD   glucose blood test strip Use as instructed   meloxicam 15 MG tablet Commonly known as: MOBIC TAKE 1 TABLET BY MOUTH EVERY DAY   metFORMIN 1000 MG tablet Commonly known as: GLUCOPHAGE Take 1 tablet (1,000 mg total) by mouth daily.   montelukast 10 MG tablet Commonly known as: SINGULAIR Take 10 mg by mouth daily.   pioglitazone 45 MG tablet Commonly known as: ACTOS Take 1 tablet (45 mg total) by mouth daily.   pravastatin 20 MG tablet Commonly known as: PRAVACHOL Take 1 tablet (20 mg total) by mouth daily.   tiZANidine 4 MG tablet Commonly known as: ZANAFLEX TAKE 1 TABLET (4 MG TOTAL) BY MOUTH AT BEDTIME AS NEEDED FOR MUSCLE SPASMS.        OBJECTIVE:   Vital Signs: BP 134/84   Pulse 88   Temp (!) 96.8 F (36 C) (Temporal)   Ht 5' 8"  (1.727 m)   Wt 234 lb 4 oz (106.3 kg)   SpO2 97%   BMI 35.62 kg/m   Wt Readings from Last 3 Encounters:  03/22/20 234 lb 4 oz (106.3 kg)  11/21/19 233 lb 6.4 oz (105.9 kg)  10/20/19 238 lb (108  kg)     Exam: General: Pt appears well and is in NAD  Neck: General: Supple without adenopathy. Thyroid: Thyroid size normal.  No goiter or nodules appreciated. No thyroid bruit.  Lungs: Clear with good BS bilat with no rales, rhonchi, or wheezes  Heart: RRR with normal S1 and S2 and no gallops; no murmurs; no rub  Abdomen: Normoactive bowel sounds, soft, nontender, without masses or organomegaly palpable  Extremities: No pretibial edema.   Neuro: MS is good with appropriate affect, pt is alert and Ox3    DM foot exam: 11/21/2019 The skin of the feet  is intact without sores or ulcerations. The pedal pulses are 1+ on right and 1+ on left. The sensation is intact to a screening 5.07, 10 gram monofilament bilaterally    DATA REVIEWED:  Lab Results  Component Value Date   HGBA1C 6.3 (A) 03/22/2020   HGBA1C 6.0 (A) 11/21/2019   HGBA1C 6.4 (A) 07/26/2019   Lab Results  Component Value Date   MICROALBUR 8.0 (H) 11/12/2018   LDLCALC 91 11/12/2018   CREATININE 0.78 05/31/2019   Lab Results  Component Value Date   MICRALBCREAT 10.9 11/12/2018     Lab Results  Component Value Date   CHOL 163 11/12/2018   HDL 32.50 (L) 11/12/2018   LDLCALC 91 11/12/2018   LDLDIRECT 88.0 08/15/2016   TRIG 197.0 (H) 11/12/2018   CHOLHDL 5 11/12/2018         ASSESSMENT / PLAN / RECOMMENDATIONS:   1) Type 2 Diabetes Mellitus, Optimally controlled, With Neuropathic complications - Most recent A1c of 6.3%. Goal A1c < 7.0 %.    -He continues to have optimal glucose control despite recent dietary indiscretions. He is motivated to improve his eating habits again.  - Our goal is to taper off medications, he would like to cut the Metformin as this gives his GI issues. I am going to switch to 500 extended release formulation  - Will switch Glipizide to supper time rather the breakfast time, as his dinner is larger then breakfast.  - NO changes today    MEDICATIONS: - Continue  Glipizide 5 mg,  to 1 tablet before Supper - Continue Metformin 500 mg XR, 2 tabs daily  - Continue Farxiga 10 mg tablets - Continue Pioglitazone 45 mg daily   EDUCATION / INSTRUCTIONS:  BG monitoring instructions: Patient is instructed to check his blood sugars 1 times a day.  Call [facility] if: BG persistently < 70  . I reviewed the Rule of 15 for the treatment of hypoglycemia in detail with the patient. Literature supplied.    F/U in 4 months   Signed electronically by: Mack Guise, MD  [facility]  [facility] [address] [place], [place] [address] Phone: [phone] FAX: [phone]   CC: Owens Loffler, [place] [address] Phone: [phone]  Fax: [phone]  Return to Endocrinology clinic as below: Future Appointments  Date Time Provider Page  04/02/2020  9:20 AM Copland, Frederico Hamman, MD [id number]

## 2020-03-29 DIAGNOSIS — J301 Allergic rhinitis due to pollen: Secondary | ICD-10-CM | POA: Diagnosis not present

## 2020-03-29 DIAGNOSIS — J3089 Other allergic rhinitis: Secondary | ICD-10-CM | POA: Diagnosis not present

## 2020-04-01 ENCOUNTER — Encounter: Payer: Self-pay | Admitting: Family Medicine

## 2020-04-01 NOTE — Progress Notes (Signed)
Money Mckeithan T. Makya Phillis, MD, [facility] [place], [address]  Phone: [phone]  FAX: [place] - 44 y.o. male  MRN [id number]  Date of Birth: [birth year]  Date: 04/02/2020  PCP: Owens Loffler, MD  Referral: Owens Loffler, MD  Chief Complaint  Patient presents with  . Annual Exam    This visit occurred during the SARS-CoV-2 public health emergency.  Safety protocols were in place, including screening questions prior to the visit, additional usage of staff PPE, and extensive cleaning of exam room while observing appropriate contact time as indicated for disinfecting solutions.   Patient Care Team: Owens Loffler, MD as PCP - General (Family Medicine) Subjective:   Lonnie Jones is a 44 y.o. pleasant patient who presents with the following:  Preventative Health Maintenance Visit:  Health Maintenance Summary Reviewed and updated, unless pt declines services.  Tobacco History Reviewed.  Some pouch - some  Alcohol: No concerns, no excessive use Exercise Habits: Some activity, rec at least 30 mins 5 times a week STD concerns: no risk or activity to increase risk Drug Use: None  Needs baseline labs today  Endocrine managing his DM now  Wt Readings from Last 3 Encounters:  04/02/20 232 lb 4 oz (105.3 kg)  03/22/20 234 lb 4 oz (106.3 kg)  11/21/19 233 lb 6.4 oz (105.9 kg)     Health Maintenance  Topic Date Due  . Hepatitis C Screening  Never done  . OPHTHALMOLOGY EXAM  11/18/2018  . URINE MICROALBUMIN  11/12/2019  . HEMOGLOBIN A1C  09/20/2020  . FOOT EXAM  11/20/2020  . TETANUS/TDAP  07/28/2022  . INFLUENZA VACCINE  Completed  . PNEUMOCOCCAL POLYSACCHARIDE VACCINE AGE 51-64 HIGH RISK  Completed  . COVID-19 Vaccine  Completed  . HIV Screening  Completed   Immunization History  Administered Date(s) Administered  . Influenza,  Seasonal, Injecte, Preservative Fre 07/28/2012  . Influenza,inj,Quad PF,6+ Mos 01/26/2013, 02/15/2015, 05/12/2016, 02/24/2018, 02/11/2019, 04/02/2020  . PFIZER SARS-COV-2 Vaccination 08/26/2019, 09/19/2019  . Pneumococcal Polysaccharide-23 07/28/2012, 08/24/2017  . Tdap 07/28/2012   Patient Active Problem List   Diagnosis Date Noted  . Acute idiopathic gout involving toe 06/22/2017  . Peripheral autonomic neuropathy due to diabetes mellitus ([place]) 02/16/2015  . Allergic rhinitis due to allergen 08/25/2011  . Hyperlipidemia with target LDL less than 70 12/16/2010  . Type 2 diabetes mellitus with peripheral neuropathy ([place]) 08/08/2010  . MIGRAINE HEADACHE 08/08/2010  . ASTHMA 08/08/2010    Past Medical History:  Diagnosis Date  . Acute idiopathic gout involving toe 06/22/2017  . Allergic rhinitis due to allergen 08/25/2011  . Asthma   . GERD (gastroesophageal reflux disease)   . Hyperlipidemia LDL goal < 70 12/16/2010  . Migraine headache   . Type 2 diabetes mellitus with peripheral neuropathy ([place]) 08/08/2010   Qualifier: Diagnosis of  By: Lorelei Pont MD, Frederico Hamman      Past Surgical History:  Procedure Laterality Date  . APPENDECTOMY  2000  . CARPAL TUNNEL RELEASE    . KNEE SURGERY  2011    Family History  Problem Relation Age of Onset  . Healthy Mother   . Healthy Father     Past Medical History, Surgical History, Social History, Family History, Problem List, Medications, and Allergies have been reviewed and updated if relevant.  Review of Systems: Pertinent positives are listed above.  Otherwise, a full 14 point review  of systems has been done in full and it is negative except where it is noted positive.  Objective:   BP 120/74   Pulse 75   Temp 98.5 F (36.9 C) (Temporal)   Ht 5\' 7"  (1.702 m)   Wt 232 lb 4 oz (105.3 kg)   SpO2 96%   BMI 36.38 kg/m  Ideal Body Weight: Weight in (lb) to have BMI = 25: 159.3  Ideal Body Weight: Weight in (lb) to have BMI = 25: 159.3 No  exam data present Depression screen [facility] 2/9 06/27/2019 01/19/2019 08/24/2017 12/04/2016  Decreased Interest 0 0 0 0  Down, Depressed, Hopeless 0 0 0 0  PHQ - 2 Score 0 0 0 0     GEN: well developed, well nourished, no acute distress Eyes: conjunctiva and lids normal, PERRLA, EOMI ENT: TM clear, nares clear, oral exam WNL Neck: supple, no lymphadenopathy, no thyromegaly, no JVD Pulm: clear to auscultation and percussion, respiratory effort normal CV: regular rate and rhythm, S1-S2, no murmur, rub or gallop, no bruits, peripheral pulses normal and symmetric, no cyanosis, clubbing, edema or varicosities GI: soft, non-tender; no hepatosplenomegaly, masses; active bowel sounds all quadrants GU: deferred Lymph: no cervical, axillary or inguinal adenopathy MSK: gait normal, muscle tone and strength WNL, no joint swelling, effusions, discoloration, crepitus  SKIN: clear, good turgor, color WNL, no rashes, lesions, or ulcerations Neuro: normal mental status, normal strength, sensation, and motion Psych: alert; oriented to person, place and time, normally interactive and not anxious or depressed in appearance.  All labs reviewed with patient. Results for orders placed or performed in visit on 03/22/20  POCT glycosylated hemoglobin (Hb A1C)  Result Value Ref Range   Hemoglobin A1C 6.3 (A) 4.0 - 5.6 %   HbA1c POC (<> result, manual entry)     HbA1c, POC (prediabetic range)     HbA1c, POC (controlled diabetic range)      Assessment and Plan:     ICD-10-CM   1. Healthcare maintenance  Z00.00   2. Need for influenza vaccination  Z23 Flu Vaccine QUAD 6+ mos PF IM (Fluarix Quad PF)  3. Hyperlipidemia with target LDL less than 70  E78.5 Lipid panel  4. Encounter for long-term (current) use of medications  Y56.389 Basic metabolic panel    CBC with Differential/Platelet    Hepatic function panel  5. Screening PSA (prostate specific antigen)  Z12.5 PSA, Total with Reflex to PSA, Free   Goal weight  loss down to 210 Work on activity level  With endo managing his DM, he can f/u with me annually  Health Maintenance Exam: The patient's preventative maintenance and recommended screening tests for an annual wellness exam were reviewed in full today. Brought up to date unless services declined.  Counselled on the importance of diet, exercise, and its role in overall health and mortality. The patient's FH and SH was reviewed, including their home life, tobacco status, and drug and alcohol status.  Follow-up in 1 year for physical exam or additional follow-up below.  Follow-up: No follow-ups on file. Or follow-up in 1 year if not noted.  No orders of the defined types were placed in this encounter.  Medications Discontinued During This Encounter  Medication Reason  . gabapentin (NEURONTIN) 373 MG capsule Duplicate   Orders Placed This Encounter  Procedures  . Flu Vaccine QUAD 6+ mos PF IM (Fluarix Quad PF)  . Basic metabolic panel  . CBC with Differential/Platelet  . Hepatic function panel  . Lipid  panel  . PSA, Total with Reflex to PSA, Free    Signed,  Nikolos Billig T. Indy Kuck, MD   Allergies as of 04/02/2020      Reactions   Codeine       Medication List       Accurate as of April 02, 2020  9:44 AM. If you have any questions, ask your nurse or doctor.        azelastine 0.1 % nasal spray Commonly known as: ASTELIN INHALE 2 SPRAYS IN EACH NOSTRIL TWICE A DAY   EPINEPHrine 0.3 mg/0.3 mL Soaj injection Commonly known as: EPI-PEN See admin instructions. for allergic reaction   eszopiclone 1 MG Tabs tablet Commonly known as: LUNESTA TAKE 1 TABLET BY MOUTH AT BEDTIME AS NEEDED FOR SLEEP. TAKE IMMEDIATELY BEFORE BEDTIME   Farxiga 10 MG Tabs tablet Generic drug: dapagliflozin propanediol Take 10 mg by mouth daily before breakfast.   fluticasone 50 MCG/ACT nasal spray Commonly known as: FLONASE SPRAY 1 SPRAY INTO EACH NOSTRIL EVERY DAY   FreeStyle Libre 2 Reader  Devi 1 Device by Does not apply route as directed.   FreeStyle Libre 2 Sensor Misc 1 Device by Does not apply route as directed.   gabapentin 400 MG capsule Commonly known as: NEURONTIN Take 400 mg by mouth 2 (two) times daily.   glipiZIDE 5 MG tablet Commonly known as: GLUCOTROL Take 1 tablet (5 mg total) by mouth daily before supper.   glucose blood test strip Use as instructed   levocetirizine 5 MG tablet Commonly known as: XYZAL Take 5 mg by mouth every evening.   meloxicam 15 MG tablet Commonly known as: MOBIC TAKE 1 TABLET BY MOUTH EVERY DAY   metFORMIN 500 MG 24 hr tablet Commonly known as: GLUCOPHAGE-XR Take 2 tablets (1,000 mg total) by mouth daily with supper.   montelukast 10 MG tablet Commonly known as: SINGULAIR Take 10 mg by mouth daily.   pioglitazone 45 MG tablet Commonly known as: ACTOS Take 1 tablet (45 mg total) by mouth daily.   pravastatin 20 MG tablet Commonly known as: PRAVACHOL Take 1 tablet (20 mg total) by mouth daily.   tiZANidine 4 MG tablet Commonly known as: ZANAFLEX TAKE 1 TABLET (4 MG TOTAL) BY MOUTH AT BEDTIME AS NEEDED FOR MUSCLE SPASMS.

## 2020-04-02 ENCOUNTER — Encounter: Payer: Self-pay | Admitting: Family Medicine

## 2020-04-02 ENCOUNTER — Other Ambulatory Visit: Payer: Self-pay

## 2020-04-02 ENCOUNTER — Ambulatory Visit (INDEPENDENT_AMBULATORY_CARE_PROVIDER_SITE_OTHER): Payer: BC Managed Care – PPO | Admitting: Family Medicine

## 2020-04-02 VITALS — BP 120/74 | HR 75 | Temp 98.5°F | Ht 67.0 in | Wt 232.2 lb

## 2020-04-02 DIAGNOSIS — Z125 Encounter for screening for malignant neoplasm of prostate: Secondary | ICD-10-CM

## 2020-04-02 DIAGNOSIS — E785 Hyperlipidemia, unspecified: Secondary | ICD-10-CM | POA: Diagnosis not present

## 2020-04-02 DIAGNOSIS — Z23 Encounter for immunization: Secondary | ICD-10-CM

## 2020-04-02 DIAGNOSIS — Z0000 Encounter for general adult medical examination without abnormal findings: Secondary | ICD-10-CM | POA: Diagnosis not present

## 2020-04-02 DIAGNOSIS — Z79899 Other long term (current) drug therapy: Secondary | ICD-10-CM | POA: Diagnosis not present

## 2020-04-02 LAB — CBC WITH DIFFERENTIAL/PLATELET
Basophils Absolute: 0 10*3/uL (ref 0.0–0.1)
Basophils Relative: 0.3 % (ref 0.0–3.0)
Eosinophils Absolute: 0.1 10*3/uL (ref 0.0–0.7)
Eosinophils Relative: 1.1 % (ref 0.0–5.0)
HCT: 52.6 % — ABNORMAL HIGH (ref 39.0–52.0)
Hemoglobin: 17.9 g/dL — ABNORMAL HIGH (ref 13.0–17.0)
Lymphocytes Relative: 28.6 % (ref 12.0–46.0)
Lymphs Abs: 1.4 10*3/uL (ref 0.7–4.0)
MCHC: 34.1 g/dL (ref 30.0–36.0)
MCV: 89.8 fl (ref 78.0–100.0)
Monocytes Absolute: 0.4 10*3/uL (ref 0.1–1.0)
Monocytes Relative: 8.4 % (ref 3.0–12.0)
Neutro Abs: 3 10*3/uL (ref 1.4–7.7)
Neutrophils Relative %: 61.6 % (ref 43.0–77.0)
Platelets: 196 10*3/uL (ref 150.0–400.0)
RBC: 5.85 Mil/uL — ABNORMAL HIGH (ref 4.22–5.81)
RDW: 12.3 % (ref 11.5–15.5)
WBC: 4.8 10*3/uL (ref 4.0–10.5)

## 2020-04-02 LAB — HEPATIC FUNCTION PANEL
ALT: 31 U/L (ref 0–53)
AST: 17 U/L (ref 0–37)
Albumin: 4.6 g/dL (ref 3.5–5.2)
Alkaline Phosphatase: 61 U/L (ref 39–117)
Bilirubin, Direct: 0.2 mg/dL (ref 0.0–0.3)
Total Bilirubin: 0.8 mg/dL (ref 0.2–1.2)
Total Protein: 6.8 g/dL (ref 6.0–8.3)

## 2020-04-02 LAB — LIPID PANEL
Cholesterol: 140 mg/dL (ref 0–200)
HDL: 44.3 mg/dL (ref >39.00)
LDL Cholesterol: 69 mg/dL (ref 0–99)
NonHDL: 96.16
Total CHOL/HDL Ratio: 3
Triglycerides: 135 mg/dL (ref 0.0–149.0)
VLDL: 27 mg/dL (ref 0.0–40.0)

## 2020-04-02 LAB — BASIC METABOLIC PANEL
BUN: 20 mg/dL (ref 6–23)
CO2: 31 mEq/L (ref 19–32)
Calcium: 9.5 mg/dL (ref 8.4–10.5)
Chloride: 100 mEq/L (ref 96–112)
Creatinine, Ser: 0.73 mg/dL (ref 0.40–1.50)
GFR: 111.15 mL/min (ref >60.00)
Glucose, Bld: 146 mg/dL — ABNORMAL HIGH (ref 70–99)
Potassium: 5.3 mEq/L — ABNORMAL HIGH (ref 3.5–5.1)
Sodium: 138 mEq/L (ref 135–145)

## 2020-04-02 MED ORDER — GABAPENTIN 400 MG PO CAPS: 400.0000 mg | ORAL_CAPSULE | Freq: Two times a day (BID) | ORAL | 3 refills | Status: DC

## 2020-04-02 MED ORDER — MONTELUKAST SODIUM 10 MG PO TABS: 10.0000 mg | ORAL_TABLET | Freq: Every day | ORAL | 3 refills | Status: DC

## 2020-04-02 MED ORDER — CELECOXIB 200 MG PO CAPS: 200.0000 mg | ORAL_CAPSULE | Freq: Every day | ORAL | 3 refills | Status: DC

## 2020-04-02 MED ORDER — TIZANIDINE HCL 4 MG PO TABS: 4.0000 mg | ORAL_TABLET | Freq: Every evening | ORAL | 3 refills | Status: AC | PRN

## 2020-04-02 MED ORDER — ESZOPICLONE 1 MG PO TABS: 1.0000 mg | ORAL_TABLET | Freq: Every evening | ORAL | 3 refills | Status: DC | PRN

## 2020-04-02 MED ORDER — PRAVASTATIN SODIUM 20 MG PO TABS: 20.0000 mg | ORAL_TABLET | Freq: Every day | ORAL | 0 refills | Status: DC

## 2020-04-03 DIAGNOSIS — J3089 Other allergic rhinitis: Secondary | ICD-10-CM | POA: Diagnosis not present

## 2020-04-03 DIAGNOSIS — J301 Allergic rhinitis due to pollen: Secondary | ICD-10-CM | POA: Diagnosis not present

## 2020-04-03 LAB — PSA, TOTAL WITH REFLEX TO PSA, FREE: PSA, Total: 0.8 ng/mL (ref <=4.0)

## 2020-04-09 ENCOUNTER — Encounter: Payer: Self-pay | Admitting: Family Medicine

## 2020-04-09 ENCOUNTER — Other Ambulatory Visit: Payer: Self-pay | Admitting: Family Medicine

## 2020-04-09 ENCOUNTER — Telehealth: Payer: Self-pay | Admitting: Family Medicine

## 2020-04-09 DIAGNOSIS — E875 Hyperkalemia: Secondary | ICD-10-CM

## 2020-04-09 DIAGNOSIS — D582 Other hemoglobinopathies: Secondary | ICD-10-CM

## 2020-04-09 NOTE — Telephone Encounter (Signed)
Pt returned your call.  

## 2020-04-09 NOTE — Telephone Encounter (Signed)
Lab results discussed with Lonnie Jones.  See result note. Lab appointment scheduled for 05/09/2020 at 8:20 am.

## 2020-04-10 DIAGNOSIS — J301 Allergic rhinitis due to pollen: Secondary | ICD-10-CM | POA: Diagnosis not present

## 2020-04-10 DIAGNOSIS — J3089 Other allergic rhinitis: Secondary | ICD-10-CM | POA: Diagnosis not present

## 2020-04-17 DIAGNOSIS — J301 Allergic rhinitis due to pollen: Secondary | ICD-10-CM | POA: Diagnosis not present

## 2020-04-17 DIAGNOSIS — J3089 Other allergic rhinitis: Secondary | ICD-10-CM | POA: Diagnosis not present

## 2020-04-26 ENCOUNTER — Other Ambulatory Visit: Payer: Self-pay | Admitting: Internal Medicine

## 2020-04-26 DIAGNOSIS — E1142 Type 2 diabetes mellitus with diabetic polyneuropathy: Secondary | ICD-10-CM

## 2020-05-01 DIAGNOSIS — J301 Allergic rhinitis due to pollen: Secondary | ICD-10-CM | POA: Diagnosis not present

## 2020-05-01 DIAGNOSIS — J3089 Other allergic rhinitis: Secondary | ICD-10-CM | POA: Diagnosis not present

## 2020-05-05 ENCOUNTER — Encounter: Payer: Self-pay | Admitting: Family Medicine

## 2020-05-07 MED ORDER — DOXYCYCLINE HYCLATE 100 MG PO TABS
100.0000 mg | ORAL_TABLET | Freq: Two times a day (BID) | ORAL | 0 refills | Status: AC
Start: 2020-05-07 — End: 2020-05-17

## 2020-05-07 MED ORDER — PREDNISONE 20 MG PO TABS
ORAL_TABLET | ORAL | 0 refills | Status: DC
Start: 2020-05-07 — End: 2020-06-04

## 2020-05-09 ENCOUNTER — Other Ambulatory Visit: Payer: Self-pay

## 2020-05-09 ENCOUNTER — Other Ambulatory Visit (INDEPENDENT_AMBULATORY_CARE_PROVIDER_SITE_OTHER): Payer: BC Managed Care – PPO

## 2020-05-09 DIAGNOSIS — E875 Hyperkalemia: Secondary | ICD-10-CM | POA: Diagnosis not present

## 2020-05-09 DIAGNOSIS — D582 Other hemoglobinopathies: Secondary | ICD-10-CM | POA: Diagnosis not present

## 2020-05-09 LAB — CBC WITH DIFFERENTIAL/PLATELET
Basophils Absolute: 0 10*3/uL (ref 0.0–0.1)
Basophils Relative: 0.3 % (ref 0.0–3.0)
Eosinophils Absolute: 0.1 10*3/uL (ref 0.0–0.7)
Eosinophils Relative: 1.4 % (ref 0.0–5.0)
HCT: 47.3 % (ref 39.0–52.0)
Hemoglobin: 16.3 g/dL (ref 13.0–17.0)
Lymphocytes Relative: 26.6 % (ref 12.0–46.0)
Lymphs Abs: 1.6 10*3/uL (ref 0.7–4.0)
MCHC: 34.6 g/dL (ref 30.0–36.0)
MCV: 89.1 fl (ref 78.0–100.0)
Monocytes Absolute: 0.7 10*3/uL (ref 0.1–1.0)
Monocytes Relative: 12.4 % — ABNORMAL HIGH (ref 3.0–12.0)
Neutro Abs: 3.5 10*3/uL (ref 1.4–7.7)
Neutrophils Relative %: 59.3 % (ref 43.0–77.0)
Platelets: 195 10*3/uL (ref 150.0–400.0)
RBC: 5.31 Mil/uL (ref 4.22–5.81)
RDW: 12.5 % (ref 11.5–15.5)
WBC: 5.9 10*3/uL (ref 4.0–10.5)

## 2020-05-09 LAB — POTASSIUM: Potassium: 4.2 mEq/L (ref 3.5–5.1)

## 2020-05-15 DIAGNOSIS — J3089 Other allergic rhinitis: Secondary | ICD-10-CM | POA: Diagnosis not present

## 2020-05-15 DIAGNOSIS — J301 Allergic rhinitis due to pollen: Secondary | ICD-10-CM | POA: Diagnosis not present

## 2020-05-24 DIAGNOSIS — J301 Allergic rhinitis due to pollen: Secondary | ICD-10-CM | POA: Diagnosis not present

## 2020-05-24 DIAGNOSIS — J3089 Other allergic rhinitis: Secondary | ICD-10-CM | POA: Diagnosis not present

## 2020-05-29 DIAGNOSIS — J3089 Other allergic rhinitis: Secondary | ICD-10-CM | POA: Diagnosis not present

## 2020-05-29 DIAGNOSIS — J301 Allergic rhinitis due to pollen: Secondary | ICD-10-CM | POA: Diagnosis not present

## 2020-05-31 DIAGNOSIS — J3089 Other allergic rhinitis: Secondary | ICD-10-CM | POA: Diagnosis not present

## 2020-05-31 DIAGNOSIS — J301 Allergic rhinitis due to pollen: Secondary | ICD-10-CM | POA: Diagnosis not present

## 2020-06-04 ENCOUNTER — Ambulatory Visit (HOSPITAL_COMMUNITY)
Admission: EM | Admit: 2020-06-04 | Discharge: 2020-06-04 | Disposition: A | Discharge status: Home / Self Care | Payer: BC Managed Care – PPO | Attending: Family Medicine | Admitting: Family Medicine

## 2020-06-04 ENCOUNTER — Encounter (HOSPITAL_COMMUNITY): Payer: Self-pay

## 2020-06-04 ENCOUNTER — Telehealth: Payer: BC Managed Care – PPO | Admitting: Family Medicine

## 2020-06-04 DIAGNOSIS — R062 Wheezing: Secondary | ICD-10-CM | POA: Diagnosis not present

## 2020-06-04 DIAGNOSIS — J069 Acute upper respiratory infection, unspecified: Secondary | ICD-10-CM | POA: Diagnosis not present

## 2020-06-04 DIAGNOSIS — R030 Elevated blood-pressure reading, without diagnosis of hypertension: Secondary | ICD-10-CM | POA: Diagnosis present

## 2020-06-04 DIAGNOSIS — H9201 Otalgia, right ear: Secondary | ICD-10-CM | POA: Diagnosis not present

## 2020-06-04 DIAGNOSIS — U071 COVID-19: Secondary | ICD-10-CM | POA: Diagnosis not present

## 2020-06-04 LAB — SARS CORONAVIRUS 2 (TAT 6-24 HRS): SARS Coronavirus 2: POSITIVE — AB

## 2020-06-04 MED ORDER — PREDNISONE 20 MG PO TABS
40.0000 mg | ORAL_TABLET | Freq: Every day | ORAL | 0 refills | Status: DC
Start: 2020-06-04 — End: 2020-07-26

## 2020-06-04 MED ORDER — ALBUTEROL SULFATE HFA 108 (90 BASE) MCG/ACT IN AERS: 1.0000 | INHALATION_SPRAY | Freq: Four times a day (QID) | RESPIRATORY_TRACT | 1 refills | Status: DC | PRN

## 2020-06-04 NOTE — ED Provider Notes (Signed)
[place]   [id number] 06/04/20 Arrival Time: [id number]  ASSESSMENT & PLAN:  1. Viral upper respiratory tract infection   2. Right ear pain   3. Wheezing   4. Elevated blood pressure reading without diagnosis of hypertension      COVID-19 testing sent. See letter/work note on file for self-isolation guidelines. OTC symptom care as needed.  Meds ordered this encounter  Medications  . albuterol (VENTOLIN HFA) 108 (90 Base) MCG/ACT inhaler    Sig: Inhale 1-2 puffs into the lungs every 6 (six) hours as needed for wheezing or shortness of breath.    Dispense:  1 each    Refill:  1  . predniSONE (DELTASONE) 20 MG tablet    Sig: Take 2 tablets (40 mg total) by mouth daily.    Dispense:  10 tablet    Refill:  0     Follow-up Information    Copland, Frederico Hamman, MD.   Specialties: Family Medicine, Sports Medicine Why: As needed. Contact information: [address] [place] [address] [phone]                 Discharge Instructions     You have been tested for COVID-19 today. If your test returns positive, you will receive a phone call from [facility] regarding your results. Negative test results are not called. Both positive and negative results area always visible on MyChart. If you do not have a MyChart account, sign up instructions are provided in your discharge papers. Please do not hesitate to contact us should you have questions or concerns.  Your blood pressure was noted to be elevated during your visit today. If you are currently taking medication for high blood pressure, please ensure you are taking this as directed. If you do not have a history of high blood pressure and your blood pressure remains persistently elevated, you may need to begin taking a medication at some point. You may return here within the next few days to recheck if unable to see your primary care provider or if do not have a one.  BP (!) 161/110   Pulse 87   Temp 98.4  F (36.9 C) (Oral)   Resp 20   SpO2 98%       Reviewed expectations re: course of current medical issues. Questions answered. Outlined signs and symptoms indicating need for more acute intervention. Understanding verbalized. After Visit Summary given.   SUBJECTIVE: History from: patient. Lonnie Jones is a 45 y.o. male who reports several days of congestion/body aches; yesterday with R otalgia; no drainage or bleeding; has eased some today. Denies: fever and difficulty breathing. Normal PO intake without n/v/d.  Increased blood pressure noted today. Reports that he is not currently tx for HTN. No symptoms.   OBJECTIVE:  Vitals:   06/04/20 1129  BP: (!) 161/110  Pulse: 87  Resp: 20  Temp: 98.4 F (36.9 C)  TempSrc: Oral  SpO2: 98%    General appearance: alert; no distress Eyes: PERRLA; EOMI; conjunctiva normal HENT: [place]; AT; with nasal congestion; R TM clear with bulging Neck: supple  Lungs: speaks full sentences without difficulty; unlabored Extremities: no edema Skin: warm and dry Neurologic: normal gait Psychological: alert and cooperative; normal mood and affect  Labs:  Labs Reviewed  SARS CORONAVIRUS 2 (TAT 6-24 HRS)     Allergies  Allergen Reactions  . Codeine     Past Medical History:  Diagnosis Date  . Acute idiopathic gout involving toe 06/22/2017  .  Allergic rhinitis due to allergen 08/25/2011  . Asthma   . GERD (gastroesophageal reflux disease)   . Hyperlipidemia LDL goal < 70 12/16/2010  . Migraine headache   . Type 2 diabetes mellitus with peripheral neuropathy ([facility]) 08/08/2010   Qualifier: Diagnosis of  By: Patsy Lager MD, Karleen Hampshire     Social History   Socioeconomic History  . Marital status: Married    Spouse name: Not on file  . Number of children: 2  . Years of education: Not on file  . Highest education level: Not on file  Occupational History    Comment: Sewer  Tobacco Use  . Smoking status: Former Smoker    Quit date: 08/19/2001     Years since quitting: 18.8  . Smokeless tobacco: Current User    Types: Snuff  Substance and Sexual Activity  . Alcohol use: No    Alcohol/week: 0.0 standard drinks  . Drug use: No  . Sexual activity: Yes    Birth control/protection: None    Comment: Married  Other Topics Concern  . Not on file  Social History Narrative  . Not on file   Social Determinants of Health   Financial Resource Strain: Not on file  Food Insecurity: Not on file  Transportation Needs: Not on file  Physical Activity: Not on file  Stress: Not on file  Social Connections: Not on file  Intimate Partner Violence: Not on file   Family History  Problem Relation Age of Onset  . Healthy Mother   . Healthy Father    Past Surgical History:  Procedure Laterality Date  . APPENDECTOMY  2000  . CARPAL TUNNEL RELEASE    . KNEE SURGERY  2011     Mardella Layman, MD 06/04/20 1304

## 2020-06-04 NOTE — ED Triage Notes (Addendum)
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 Also c/o productive cough   Pt has taken claritin with no relief

## 2020-06-04 NOTE — Discharge Instructions (Addendum)
You have been tested for COVID-19 today. If your test returns positive, you will receive a phone call from [facility] regarding your results. Negative test results are not called. Both positive and negative results area always visible on MyChart. If you do not have a MyChart account, sign up instructions are provided in your discharge papers. Please do not hesitate to contact us should you have questions or concerns.  Your blood pressure was noted to be elevated during your visit today. If you are currently taking medication for high blood pressure, please ensure you are taking this as directed. If you do not have a history of high blood pressure and your blood pressure remains persistently elevated, you may need to begin taking a medication at some point. You may return here within the next few days to recheck if unable to see your primary care provider or if do not have a one.  BP (!) 161/110    Pulse 87    Temp 98.4 F (36.9 C) (Oral)    Resp 20    SpO2 98%

## 2020-06-06 ENCOUNTER — Ambulatory Visit (HOSPITAL_COMMUNITY)
Admission: RE | Admit: 2020-06-06 | Discharge: 2020-06-06 | Disposition: A | Discharge status: Home / Self Care | Payer: BC Managed Care – PPO | Source: Ambulatory Visit | Attending: Pulmonary Disease | Admitting: Pulmonary Disease

## 2020-06-06 ENCOUNTER — Other Ambulatory Visit: Payer: Self-pay | Admitting: Nurse Practitioner

## 2020-06-06 ENCOUNTER — Telehealth: Payer: Self-pay | Admitting: Family

## 2020-06-06 DIAGNOSIS — E1142 Type 2 diabetes mellitus with diabetic polyneuropathy: Secondary | ICD-10-CM

## 2020-06-06 DIAGNOSIS — U071 COVID-19: Secondary | ICD-10-CM | POA: Diagnosis not present

## 2020-06-06 DIAGNOSIS — E785 Hyperlipidemia, unspecified: Secondary | ICD-10-CM | POA: Diagnosis not present

## 2020-06-06 MED ORDER — DIPHENHYDRAMINE HCL 50 MG/ML IJ SOLN: 50.0000 mg | Freq: Once | INTRAMUSCULAR | Status: DC | PRN

## 2020-06-06 MED ORDER — SODIUM CHLORIDE 0.9 % IV SOLN
100.0000 mg | Freq: Once | INTRAVENOUS | Status: AC
  Administered 2020-06-06: 100 mg via INTRAVENOUS
  Filled 2020-06-06: qty 20

## 2020-06-06 MED ORDER — ALBUTEROL SULFATE HFA 108 (90 BASE) MCG/ACT IN AERS: 2.0000 | INHALATION_SPRAY | Freq: Once | RESPIRATORY_TRACT | Status: DC | PRN

## 2020-06-06 MED ORDER — METHYLPREDNISOLONE SODIUM SUCC 125 MG IJ SOLR: 125.0000 mg | Freq: Once | INTRAMUSCULAR | Status: DC | PRN

## 2020-06-06 MED ORDER — FAMOTIDINE IN NACL 20-0.9 MG/50ML-% IV SOLN: 20.0000 mg | Freq: Once | INTRAVENOUS | Status: DC | PRN

## 2020-06-06 MED ORDER — SODIUM CHLORIDE 0.9 % IV SOLN: INTRAVENOUS | Status: DC | PRN

## 2020-06-06 MED ORDER — EPINEPHRINE 0.3 MG/0.3ML IJ SOAJ: 0.3000 mg | Freq: Once | INTRAMUSCULAR | Status: DC | PRN

## 2020-06-06 NOTE — Progress Notes (Signed)
  Diagnosis: COVID-19  Physician: Dr. Delford Field  Procedure: [facility] Med: remdesivir infusion - Provided patient with remdesivir fact sheet for patients, parents and caregivers prior to infusion.  Complications: No immediate complications noted.  Discharge: Discharged home   Merita Norton 06/06/2020

## 2020-06-06 NOTE — Progress Notes (Signed)
I connected by phone with Lonnie Jones on 06/06/2020 at 11:06 AM to discuss the potential use of the antiviral REMDESIVIR for acute COVID-19 viral infection in non-hospitalized patients.   This patient is a 44 y.o. male that meets the FDA criteria for Emergency Use Authorization of REMDESIVIR.  Has a (+) direct SARS-CoV-2 viral test result  Has mild or moderate symptoms related to COVID-19  Is within 7 days of symptom onset  Has at least one of the high risk factor(s) for progression to severe COVID-19 and/or hospitalization as defined in NIH Guidelines and EUA.   Specific high risk criteria : BMI > 25, Diabetes, Cardiovascular disease or hypertension and Chronic Lung Disease   I have spoken and communicated the following to the patient or parent/caregiver regarding COVID-19 IV Antiviral treatment:  1. FDA has authorized the emergency use for the treatment of mild to moderate COVID-19 in adults and pediatric patients with positive results of SARS-CoV-2 testing who are 77 years of age and older weighing at least 40 kg, and who are at high risk for progressing to severe COVID-19 and/or hospitalization.  2. The significant known and potential risks and benefits in receiving REMDESIVIR in accordance with current NIH treatment guidelines.   a. The patient has demonstrated liver function tests that are in the acceptable range to administer therapy in the last 90 days   Lab Results  Component Value Date   ALT 31 04/02/2020   AST 17 04/02/2020   ALKPHOS 61 04/02/2020   BILITOT 0.8 04/02/2020    3. Information on available alternative treatments and the risks and benefits of those alternatives, including clinical trials that may be accessible to the patient.   4. Patients treated with antiviral therapy should continue to isolate and use infection control measures (e.g., wear mask, isolate, social distance, avoid sharing personal items, clean and disinfect "high touch" surfaces, and frequent  handwashing) according to CDC guidelines.   5. The patient or parent/caregiver has the option to accept or refuse REMDESIVIR therapy and has had the opportunity to have all questions addressed prior to consenting for treatment.    After reviewing this information with the patient, he/she has decided to proceed with the 3 day course of treatment.   Mayra Reel, NP 06/06/2020  11:06 AM

## 2020-06-06 NOTE — Progress Notes (Signed)
Patient reviewed Fact Sheet for Patients, Parents, and Caregivers for Emergency Use Authorization (EUA) of remdesivir for the Treatment of Coronavirus. Patient also reviewed and is agreeable to the estimated cost of treatment. Patient is agreeable to proceed.    

## 2020-06-06 NOTE — Discharge Instructions (Signed)
10 Things You Can Do to Manage Your COVID-19 Symptoms at Home If you have possible or confirmed COVID-19: 1. Stay home from work and school. And stay away from other public places. If you must go out, avoid using any kind of public transportation, ridesharing, or taxis. 2. Monitor your symptoms carefully. If your symptoms get worse, call your healthcare provider immediately. 3. Get rest and stay hydrated. 4. If you have a medical appointment, call the healthcare provider ahead of time and tell them that you have or may have COVID-19. 5. For medical emergencies, call 911 and notify the dispatch personnel that you have or may have COVID-19. 6. Cover your cough and sneezes with a tissue or use the inside of your elbow. 7. Wash your hands often with soap and water for at least 20 seconds or clean your hands with an alcohol-based hand sanitizer that contains at least 60% alcohol. 8. As much as possible, stay in a specific room and away from other people in your home. Also, you should use a separate bathroom, if available. If you need to be around other people in or outside of the home, wear a mask. 9. Avoid sharing personal items with other people in your household, like dishes, towels, and bedding. 10. Clean all surfaces that are touched often, like counters, tabletops, and doorknobs. Use household cleaning sprays or wipes according to the label instructions. cdc.gov/coronavirus 12/01/2018 This information is not intended to replace advice given to you by your health care provider. Make sure you discuss any questions you have with your health care provider. Document Revised: 05/05/2019 Document Reviewed: 05/05/2019 Elsevier Patient Education  2020 Elsevier Inc.  If you have any questions or concerns after the infusion please call the Advanced Practice Provider on call at [phone]. This number is ONLY intended for your use regarding questions or concerns about the infusion post-treatment  side-effects.  Please do not provide this number to others for use. For return to work notes please contact your primary care provider.   If someone you know is interested in receiving treatment please have them call the COVID hotline at [phone].    

## 2020-06-06 NOTE — Telephone Encounter (Signed)
Called to discuss with patient about COVID-19 symptoms and the use of one of the available treatments for those with mild to moderate Covid symptoms and at a high risk of hospitalization.  Pt appears to qualify for outpatient treatment due to co-morbid conditions and/or a member of an at-risk group in accordance with the FDA Emergency Use Authorization.    Symptom onset: several days from 1/3 per chart Vaccinated: Yes Qualifiers: Diabetes, asthma, hyperlipidemia,  I was unable to get in touch with Mr. Curfman to determine if he is a candidate for treatment. A voicemail with hotline number and MyChart message was left.   Marcos Eke, NP 06/06/2020 9:27 AM

## 2020-06-07 ENCOUNTER — Ambulatory Visit (HOSPITAL_COMMUNITY)
Admission: RE | Admit: 2020-06-07 | Discharge: 2020-06-07 | Disposition: A | Discharge status: Home / Self Care | Payer: BC Managed Care – PPO | Source: Ambulatory Visit | Attending: Pulmonary Disease | Admitting: Pulmonary Disease

## 2020-06-07 DIAGNOSIS — U071 COVID-19: Secondary | ICD-10-CM | POA: Diagnosis not present

## 2020-06-07 DIAGNOSIS — E785 Hyperlipidemia, unspecified: Secondary | ICD-10-CM | POA: Diagnosis not present

## 2020-06-07 DIAGNOSIS — E1142 Type 2 diabetes mellitus with diabetic polyneuropathy: Secondary | ICD-10-CM | POA: Diagnosis not present

## 2020-06-07 DIAGNOSIS — J1282 Pneumonia due to coronavirus disease 2019: Secondary | ICD-10-CM | POA: Insufficient documentation

## 2020-06-07 MED ORDER — SODIUM CHLORIDE 0.9 % IV SOLN
100.0000 mg | Freq: Once | INTRAVENOUS | Status: AC
  Administered 2020-06-07: 100 mg via INTRAVENOUS

## 2020-06-07 MED ORDER — METHYLPREDNISOLONE SODIUM SUCC 125 MG IJ SOLR: 125.0000 mg | Freq: Once | INTRAMUSCULAR | Status: DC | PRN

## 2020-06-07 MED ORDER — EPINEPHRINE 0.3 MG/0.3ML IJ SOAJ: 0.3000 mg | Freq: Once | INTRAMUSCULAR | Status: DC | PRN

## 2020-06-07 MED ORDER — ALBUTEROL SULFATE HFA 108 (90 BASE) MCG/ACT IN AERS: 2.0000 | INHALATION_SPRAY | Freq: Once | RESPIRATORY_TRACT | Status: DC | PRN

## 2020-06-07 MED ORDER — SODIUM CHLORIDE 0.9 % IV SOLN: INTRAVENOUS | Status: DC | PRN

## 2020-06-07 MED ORDER — SODIUM CHLORIDE 0.9 % IV SOLN: 100.0000 mg | Freq: Once | INTRAVENOUS | Status: DC

## 2020-06-07 MED ORDER — FAMOTIDINE IN NACL 20-0.9 MG/50ML-% IV SOLN: 20.0000 mg | Freq: Once | INTRAVENOUS | Status: DC | PRN

## 2020-06-07 MED ORDER — DIPHENHYDRAMINE HCL 50 MG/ML IJ SOLN: 50.0000 mg | Freq: Once | INTRAMUSCULAR | Status: DC | PRN

## 2020-06-07 NOTE — Progress Notes (Signed)
  Diagnosis: COVID-19  Physician: Dr. Delford Field  Procedure: [facility] Infusion Clinic Med: remdesivir infusion - Provided patient with remdesivir fact sheet for patients, parents and caregivers prior to infusion.  Complications: No immediate complications noted.  Discharge: Discharged home

## 2020-06-07 NOTE — Discharge Instructions (Signed)
10 Things You Can Do to Manage Your COVID-19 Symptoms at Home If you have possible or confirmed COVID-19: 1. Stay home from work and school. And stay away from other public places. If you must go out, avoid using any kind of public transportation, ridesharing, or taxis. 2. Monitor your symptoms carefully. If your symptoms get worse, call your healthcare provider immediately. 3. Get rest and stay hydrated. 4. If you have a medical appointment, call the healthcare provider ahead of time and tell them that you have or may have COVID-19. 5. For medical emergencies, call 911 and notify the dispatch personnel that you have or may have COVID-19. 6. Cover your cough and sneezes with a tissue or use the inside of your elbow. 7. Wash your hands often with soap and water for at least 20 seconds or clean your hands with an alcohol-based hand sanitizer that contains at least 60% alcohol. 8. As much as possible, stay in a specific room and away from other people in your home. Also, you should use a separate bathroom, if available. If you need to be around other people in or outside of the home, wear a mask. 9. Avoid sharing personal items with other people in your household, like dishes, towels, and bedding. 10. Clean all surfaces that are touched often, like counters, tabletops, and doorknobs. Use household cleaning sprays or wipes according to the label instructions. cdc.gov/coronavirus 12/01/2018 This information is not intended to replace advice given to you by your health care provider. Make sure you discuss any questions you have with your health care provider. Document Revised: 05/05/2019 Document Reviewed: 05/05/2019 Elsevier Patient Education  2020 Elsevier Inc.  

## 2020-06-08 ENCOUNTER — Ambulatory Visit (HOSPITAL_COMMUNITY)
Admission: RE | Admit: 2020-06-08 | Discharge: 2020-06-08 | Disposition: A | Discharge status: Home / Self Care | Payer: BC Managed Care – PPO | Source: Ambulatory Visit | Attending: Pulmonary Disease | Admitting: Pulmonary Disease

## 2020-06-08 DIAGNOSIS — U071 COVID-19: Secondary | ICD-10-CM | POA: Insufficient documentation

## 2020-06-08 MED ORDER — SODIUM CHLORIDE 0.9 % IV SOLN: INTRAVENOUS | Status: DC | PRN

## 2020-06-08 MED ORDER — SODIUM CHLORIDE 0.9 % IV SOLN
100.0000 mg | Freq: Once | INTRAVENOUS | Status: AC
  Administered 2020-06-08: 100 mg via INTRAVENOUS

## 2020-06-08 MED ORDER — FAMOTIDINE IN NACL 20-0.9 MG/50ML-% IV SOLN: 20.0000 mg | Freq: Once | INTRAVENOUS | Status: DC | PRN

## 2020-06-08 MED ORDER — ALBUTEROL SULFATE HFA 108 (90 BASE) MCG/ACT IN AERS: 2.0000 | INHALATION_SPRAY | Freq: Once | RESPIRATORY_TRACT | Status: DC | PRN

## 2020-06-08 MED ORDER — METHYLPREDNISOLONE SODIUM SUCC 125 MG IJ SOLR: 125.0000 mg | Freq: Once | INTRAMUSCULAR | Status: DC | PRN

## 2020-06-08 MED ORDER — EPINEPHRINE 0.3 MG/0.3ML IJ SOAJ: 0.3000 mg | Freq: Once | INTRAMUSCULAR | Status: DC | PRN

## 2020-06-08 MED ORDER — DIPHENHYDRAMINE HCL 50 MG/ML IJ SOLN: 50.0000 mg | Freq: Once | INTRAMUSCULAR | Status: DC | PRN

## 2020-06-08 NOTE — Discharge Instructions (Signed)
10 Things You Can Do to Manage Your COVID-19 Symptoms at Home If you have possible or confirmed COVID-19: 1. Stay home from work and school. And stay away from other public places. If you must go out, avoid using any kind of public transportation, ridesharing, or taxis. 2. Monitor your symptoms carefully. If your symptoms get worse, call your healthcare provider immediately. 3. Get rest and stay hydrated. 4. If you have a medical appointment, call the healthcare provider ahead of time and tell them that you have or may have COVID-19. 5. For medical emergencies, call 911 and notify the dispatch personnel that you have or may have COVID-19. 6. Cover your cough and sneezes with a tissue or use the inside of your elbow. 7. Wash your hands often with soap and water for at least 20 seconds or clean your hands with an alcohol-based hand sanitizer that contains at least 60% alcohol. 8. As much as possible, stay in a specific room and away from other people in your home. Also, you should use a separate bathroom, if available. If you need to be around other people in or outside of the home, wear a mask. 9. Avoid sharing personal items with other people in your household, like dishes, towels, and bedding. 10. Clean all surfaces that are touched often, like counters, tabletops, and doorknobs. Use household cleaning sprays or wipes according to the label instructions. cdc.gov/coronavirus 12/01/2018 This information is not intended to replace advice given to you by your health care provider. Make sure you discuss any questions you have with your health care provider. Document Revised: 05/05/2019 Document Reviewed: 05/05/2019 Elsevier Patient Education  2020 Elsevier Inc.  If you have any questions or concerns after the infusion please call the Advanced Practice Provider on call at [phone]. This number is ONLY intended for your use regarding questions or concerns about the infusion post-treatment  side-effects.  Please do not provide this number to others for use. For return to work notes please contact your primary care provider.   If someone you know is interested in receiving treatment please have them call the COVID hotline at [phone].    

## 2020-06-08 NOTE — Progress Notes (Signed)
Patient reviewed Fact Sheet for Patients, Parents, and Caregivers for Emergency Use Authorization (EUA) of remdesivir for the Treatment of Coronavirus. Patient also reviewed and is agreeable to the estimated cost of treatment. Patient is agreeable to proceed.    

## 2020-06-08 NOTE — Progress Notes (Signed)
  Diagnosis: COVID-19  Physician: Dr. Wright  Procedure: [facility] Infusion Clinic Med: remdesivir infusion - Provided patient with remdesivir fact sheet for patients, parents and caregivers prior to infusion.  Complications: No immediate complications noted.  Discharge: Discharged home   Lonnie Jones 06/08/2020  \ 

## 2020-06-26 DIAGNOSIS — J301 Allergic rhinitis due to pollen: Secondary | ICD-10-CM | POA: Diagnosis not present

## 2020-06-26 DIAGNOSIS — J3089 Other allergic rhinitis: Secondary | ICD-10-CM | POA: Diagnosis not present

## 2020-07-03 DIAGNOSIS — J301 Allergic rhinitis due to pollen: Secondary | ICD-10-CM | POA: Diagnosis not present

## 2020-07-03 DIAGNOSIS — J3081 Allergic rhinitis due to animal (cat) (dog) hair and dander: Secondary | ICD-10-CM | POA: Diagnosis not present

## 2020-07-03 DIAGNOSIS — J3089 Other allergic rhinitis: Secondary | ICD-10-CM | POA: Diagnosis not present

## 2020-07-10 DIAGNOSIS — J3089 Other allergic rhinitis: Secondary | ICD-10-CM | POA: Diagnosis not present

## 2020-07-10 DIAGNOSIS — J3081 Allergic rhinitis due to animal (cat) (dog) hair and dander: Secondary | ICD-10-CM | POA: Diagnosis not present

## 2020-07-10 DIAGNOSIS — J301 Allergic rhinitis due to pollen: Secondary | ICD-10-CM | POA: Diagnosis not present

## 2020-07-11 NOTE — Addendum Note (Signed)
Encounter addended by: Paul Dykes, RN on: 07/11/2020 6:31 PM  Actions taken: Charge Capture section accepted

## 2020-07-17 MED ORDER — MELOXICAM 15 MG PO TABS: 15.0000 mg | ORAL_TABLET | Freq: Every day | ORAL | 1 refills | Status: DC

## 2020-07-18 ENCOUNTER — Other Ambulatory Visit: Payer: Self-pay | Admitting: Internal Medicine

## 2020-07-19 ENCOUNTER — Ambulatory Visit: Payer: BC Managed Care – PPO

## 2020-07-19 DIAGNOSIS — J3089 Other allergic rhinitis: Secondary | ICD-10-CM | POA: Diagnosis not present

## 2020-07-19 DIAGNOSIS — J301 Allergic rhinitis due to pollen: Secondary | ICD-10-CM | POA: Diagnosis not present

## 2020-07-26 ENCOUNTER — Ambulatory Visit: Payer: BC Managed Care – PPO | Admitting: Internal Medicine

## 2020-07-26 ENCOUNTER — Other Ambulatory Visit: Payer: Self-pay

## 2020-07-26 ENCOUNTER — Encounter: Payer: Self-pay | Admitting: Internal Medicine

## 2020-07-26 VITALS — BP 130/84 | HR 76 | Ht 64.0 in | Wt 231.5 lb

## 2020-07-26 DIAGNOSIS — E1142 Type 2 diabetes mellitus with diabetic polyneuropathy: Secondary | ICD-10-CM | POA: Diagnosis not present

## 2020-07-26 DIAGNOSIS — J3089 Other allergic rhinitis: Secondary | ICD-10-CM | POA: Diagnosis not present

## 2020-07-26 DIAGNOSIS — J301 Allergic rhinitis due to pollen: Secondary | ICD-10-CM | POA: Diagnosis not present

## 2020-07-26 LAB — POCT GLYCOSYLATED HEMOGLOBIN (HGB A1C): Hemoglobin A1C: 6.8 % — AB (ref 4.0–5.6)

## 2020-07-26 MED ORDER — GLIPIZIDE 5 MG PO TABS: ORAL_TABLET | ORAL | 3 refills | Status: DC

## 2020-07-26 NOTE — Progress Notes (Signed)
Name: Lonnie Jones  Age/ Sex: 45 y.o., male   MRN/ DOB: [id number], 1975/09/25     PCP: Owens Loffler, MD   Reason for Endocrinology Evaluation: Type 2 Diabetes Mellitus  Initial Endocrine Consultative Visit: 05/25/2019    PATIENT IDENTIFIER: [name] is a 45 y.o. male with a past medical history of T2DM, Asthma,and  Dyslipidemia. The patient has followed with Endocrinology clinic since 05/25/2019 for consultative assistance with management of his diabetes.  DIABETIC HISTORY:  Lonnie Jones was diagnosed with T2DM in 2016, he has been on oral glycemic agents since his diagnosis. No prior use of insulin. His hemoglobin A1c has ranged from  6.9% in 2019, peaking at 8.5% in 07/2017.  On his initial visit to our clinic his A1c was 7.5%. He was on metformin, glipizide and pioglitazone. We reduced Metformin due to GI side effects and started farxiga.   Works in sewer and drain cleaning  SUBJECTIVE:   During the last visit (03/22/2020): A1c 6.3 %. We continued  Glipizide, pioglitazone , metformin and  farxiga.     Today (07/26/2020): Lonnie Jones is here for a follow up on diabetes management.  He checks his blood sugars a few times a day through the CGM .The patient has not had hypoglycemic episodes since the last clinic visit.   Denies any complaints  Denies nausea or diarrhea      HOME DIABETES REGIMEN:  Metformin 1000 mg daily  Farxiga 10 mg daily  Glipizide 5 mg daily Pioglitazone 45 mg daily      Statin: Yes ACE-I/ARB: Yes   CONTINUOUS GLUCOSE MONITORING RECORD INTERPRETATION   We did not use the download as here was no data on the printed sheets        DIABETIC COMPLICATIONS: Microvascular complications:   Neuropathy  Denies: CKD, retinopathy   Last eye exam: Completed 02/2020  Macrovascular complications:   Denies: CAD, PVD, CVA   HISTORY:  Past Medical History:  Past Medical History:  Diagnosis Date  . Acute idiopathic gout  involving toe 06/22/2017  . Allergic rhinitis due to allergen 08/25/2011  . Asthma   . GERD (gastroesophageal reflux disease)   . Hyperlipidemia LDL goal < 70 12/16/2010  . Migraine headache   . Type 2 diabetes mellitus with peripheral neuropathy ([place]) 08/08/2010   Qualifier: Diagnosis of  By: Lorelei Pont MD, Frederico Hamman     Past Surgical History:  Past Surgical History:  Procedure Laterality Date  . APPENDECTOMY  2000  . CARPAL TUNNEL RELEASE    . KNEE SURGERY  2011    Social History:  reports that he quit smoking about 18 years ago. His smokeless tobacco use includes snuff. He reports that he does not drink alcohol and does not use drugs. Family History:  Family History  Problem Relation Age of Onset  . Healthy Mother   . Healthy Father      HOME MEDICATIONS: Allergies as of 07/26/2020      Reactions   Codeine       Medication List       Accurate as of July 26, 2020  9:25 AM. If you have any questions, ask your nurse or doctor.        STOP taking these medications   predniSONE 20 MG tablet Commonly known as: DELTASONE Stopped by: Dorita Sciara, MD     TAKE these medications   albuterol 108 (90 Base) MCG/ACT inhaler Commonly known as: VENTOLIN HFA Inhale 1-2 puffs into the lungs  every 6 (six) hours as needed for wheezing or shortness of breath.   azelastine 0.1 % nasal spray Commonly known as: ASTELIN INHALE 2 SPRAYS IN EACH NOSTRIL TWICE A DAY   celecoxib 200 MG capsule Commonly known as: CeleBREX Take 1 capsule (200 mg total) by mouth daily.   EPINEPHrine 0.3 mg/0.3 mL Soaj injection Commonly known as: EPI-PEN See admin instructions. for allergic reaction   eszopiclone 1 MG Tabs tablet Commonly known as: LUNESTA Take 1 tablet (1 mg total) by mouth at bedtime as needed for sleep. Take immediately before bedtime   Farxiga 10 MG Tabs tablet Generic drug: dapagliflozin propanediol Take 10 mg by mouth daily before breakfast.   dapagliflozin propanediol  10 MG Tabs tablet Commonly known as: FARXIGA Take 1 tablet (10 MG) dailty   fluticasone 50 MCG/ACT nasal spray Commonly known as: FLONASE SPRAY 1 SPRAY INTO EACH NOSTRIL EVERY DAY   FreeStyle Libre 2 Reader Devi 1 Device by Does not apply route as directed.   FreeStyle Libre 2 Sensor Misc 1 DEVICE BY DOES NOT APPLY ROUTE AS DIRECTED.   gabapentin 400 MG capsule Commonly known as: NEURONTIN Take 1 capsule (400 mg total) by mouth 2 (two) times daily.   glipiZIDE 5 MG tablet Commonly known as: GLUCOTROL Take 1 tablet (5 mg total) by mouth daily before supper.   glucose blood test strip Use as instructed   levocetirizine 5 MG tablet Commonly known as: XYZAL Take 5 mg by mouth every evening.   meloxicam 15 MG tablet Commonly known as: MOBIC Take 1 tablet (15 mg total) by mouth daily.   metFORMIN 500 MG 24 hr tablet Commonly known as: GLUCOPHAGE-XR Take 2 tablets (1,000 mg total) by mouth daily with supper.   montelukast 10 MG tablet Commonly known as: SINGULAIR Take 1 tablet (10 mg total) by mouth daily.   pioglitazone 45 MG tablet Commonly known as: ACTOS Take 1 tablet (45 mg total) by mouth daily.   pravastatin 20 MG tablet Commonly known as: PRAVACHOL Take 1 tablet (20 mg total) by mouth daily.        OBJECTIVE:   Vital Signs: BP 130/84   Pulse 76   Ht 5\' 4"  (1.626 m)   Wt 231 lb 8 oz (105 kg)   SpO2 98%   BMI 39.74 kg/m   Wt Readings from Last 3 Encounters:  07/26/20 231 lb 8 oz (105 kg)  04/02/20 232 lb 4 oz (105.3 kg)  03/22/20 234 lb 4 oz (106.3 kg)     Exam: General: Pt appears well and is in NAD  Lungs: Clear with good BS bilat with no rales, rhonchi, or wheezes  Heart: RRR with normal S1 and S2 and no gallops; no murmurs; no rub  Abdomen: Normoactive bowel sounds, soft, nontender, without masses or organomegaly palpable  Extremities: No pretibial edema.   Neuro: MS is good with appropriate affect, pt is alert and Ox3    DM foot exam:  07/26/2020 The skin of the feet is intact without sores or ulcerations. The pedal pulses are 1+ on right and 1+ on left. The sensation is intact to a screening 5.07, 10 gram monofilament bilaterally    DATA REVIEWED:  Lab Results  Component Value Date   HGBA1C 6.8 (A) 07/26/2020   HGBA1C 6.3 (A) 03/22/2020   HGBA1C 6.0 (A) 11/21/2019   Lab Results  Component Value Date   MICROALBUR 8.0 (H) 11/12/2018   LDLCALC 69 04/02/2020   CREATININE 0.73 04/02/2020  Lab Results  Component Value Date   MICRALBCREAT 10.9 11/12/2018     Lab Results  Component Value Date   CHOL 140 04/02/2020   HDL 44.30 04/02/2020   LDLCALC 69 04/02/2020   LDLDIRECT 88.0 08/15/2016   TRIG 135.0 04/02/2020   CHOLHDL 3 04/02/2020         ASSESSMENT / PLAN / RECOMMENDATIONS:   1) Type 2 Diabetes Mellitus, Optimally controlled, With Neuropathic complications - Most recent A1c of 6.8%. Goal A1c < 7.0 %.     - Slight increase from 6.3 % to 6.8 % but still within goal. Has been noted with some diatary indiscretions  - Discussed GLP-1 agonists briefly today, will consider in the future - IN the meantime will add half a tablet (2.5 mg) of Glipizide before supper  - Intolerant to higher doses of metformin   MEDICATIONS: - Continue  Glipizide 5 mg,  1 tablet before Breakfast and Half a tablet before supper  - Continue Metformin 500 mg XR, 2 tabs daily  - Continue Farxiga 10 mg tablets - Continue Pioglitazone 45 mg daily   EDUCATION / INSTRUCTIONS:  BG monitoring instructions: Patient is instructed to check his blood sugars 1 times a day.  Call [place] [facility] clinic if: BG persistently < 70  . I reviewed the Rule of 15 for the treatment of hypoglycemia in detail with the patient. Literature supplied.    F/U in 4 months   Signed electronically by: Mack Guise, MD  [facility]  [facility] [address] [place], [place] [address] Phone:  [phone] FAX: [phone]   CC: [address], [place] [address] Phone: [phone]  Fax: [phone]  Return to Endocrinology clinic as below: No future appointments.

## 2020-07-26 NOTE — Patient Instructions (Signed)
-   Continue  Glipizide 5 mg  1 tablet before Supper , half a tablet before Supper  - Continue Metformin 1000 mg once daily  - Continue Farxiga 10 mg tablets - Continue Pioglitazone 45 mg daily          HOW TO TREAT LOW BLOOD SUGARS (Blood sugar LESS THAN 70 MG/DL)  Please follow the RULE OF 15 for the treatment of hypoglycemia treatment (when your (blood sugars are less than 70 mg/dL)    STEP 1: Take 15 grams of carbohydrates when your blood sugar is low, which includes:   3-4 GLUCOSE TABS  OR  3-4 OZ OF JUICE OR REGULAR SODA OR  ONE TUBE OF GLUCOSE GEL     STEP 2: RECHECK blood sugar in 15 MINUTES STEP 3: If your blood sugar is still low at the 15 minute recheck --> then, go back to STEP 1 and treat AGAIN with another 15 grams of carbohydrates.

## 2020-07-29 ENCOUNTER — Other Ambulatory Visit: Payer: Self-pay | Admitting: Internal Medicine

## 2020-07-31 DIAGNOSIS — J3089 Other allergic rhinitis: Secondary | ICD-10-CM | POA: Diagnosis not present

## 2020-07-31 DIAGNOSIS — J301 Allergic rhinitis due to pollen: Secondary | ICD-10-CM | POA: Diagnosis not present

## 2020-08-02 ENCOUNTER — Other Ambulatory Visit: Payer: Self-pay | Admitting: Internal Medicine

## 2020-08-02 MED ORDER — METFORMIN HCL ER 500 MG PO TB24: 1000.0000 mg | ORAL_TABLET | Freq: Every day | ORAL | 1 refills | Status: DC

## 2020-08-09 DIAGNOSIS — J301 Allergic rhinitis due to pollen: Secondary | ICD-10-CM | POA: Diagnosis not present

## 2020-08-09 DIAGNOSIS — J3089 Other allergic rhinitis: Secondary | ICD-10-CM | POA: Diagnosis not present

## 2020-08-14 DIAGNOSIS — J3089 Other allergic rhinitis: Secondary | ICD-10-CM | POA: Diagnosis not present

## 2020-08-14 DIAGNOSIS — J301 Allergic rhinitis due to pollen: Secondary | ICD-10-CM | POA: Diagnosis not present

## 2020-08-14 DIAGNOSIS — J3081 Allergic rhinitis due to animal (cat) (dog) hair and dander: Secondary | ICD-10-CM | POA: Diagnosis not present

## 2020-08-15 ENCOUNTER — Other Ambulatory Visit: Payer: Self-pay | Admitting: Internal Medicine

## 2020-08-21 DIAGNOSIS — J301 Allergic rhinitis due to pollen: Secondary | ICD-10-CM | POA: Diagnosis not present

## 2020-08-21 DIAGNOSIS — J3089 Other allergic rhinitis: Secondary | ICD-10-CM | POA: Diagnosis not present

## 2020-08-28 DIAGNOSIS — H1045 Other chronic allergic conjunctivitis: Secondary | ICD-10-CM | POA: Diagnosis not present

## 2020-08-28 DIAGNOSIS — J3089 Other allergic rhinitis: Secondary | ICD-10-CM | POA: Diagnosis not present

## 2020-08-28 DIAGNOSIS — J4520 Mild intermittent asthma, uncomplicated: Secondary | ICD-10-CM | POA: Diagnosis not present

## 2020-08-28 DIAGNOSIS — J0100 Acute maxillary sinusitis, unspecified: Secondary | ICD-10-CM | POA: Diagnosis not present

## 2020-08-28 DIAGNOSIS — J301 Allergic rhinitis due to pollen: Secondary | ICD-10-CM | POA: Diagnosis not present

## 2020-09-04 DIAGNOSIS — J3089 Other allergic rhinitis: Secondary | ICD-10-CM | POA: Diagnosis not present

## 2020-09-04 DIAGNOSIS — J301 Allergic rhinitis due to pollen: Secondary | ICD-10-CM | POA: Diagnosis not present

## 2020-09-13 DIAGNOSIS — J3089 Other allergic rhinitis: Secondary | ICD-10-CM | POA: Diagnosis not present

## 2020-09-13 DIAGNOSIS — J301 Allergic rhinitis due to pollen: Secondary | ICD-10-CM | POA: Diagnosis not present

## 2020-09-23 ENCOUNTER — Other Ambulatory Visit: Payer: Self-pay | Admitting: Family Medicine

## 2020-09-23 ENCOUNTER — Other Ambulatory Visit: Payer: Self-pay | Admitting: Internal Medicine

## 2020-09-25 DIAGNOSIS — J301 Allergic rhinitis due to pollen: Secondary | ICD-10-CM | POA: Diagnosis not present

## 2020-09-25 DIAGNOSIS — J3089 Other allergic rhinitis: Secondary | ICD-10-CM | POA: Diagnosis not present

## 2020-10-01 DIAGNOSIS — S63592A Other specified sprain of left wrist, initial encounter: Secondary | ICD-10-CM | POA: Diagnosis not present

## 2020-10-02 DIAGNOSIS — J301 Allergic rhinitis due to pollen: Secondary | ICD-10-CM | POA: Diagnosis not present

## 2020-10-02 DIAGNOSIS — J3089 Other allergic rhinitis: Secondary | ICD-10-CM | POA: Diagnosis not present

## 2020-10-09 DIAGNOSIS — J3081 Allergic rhinitis due to animal (cat) (dog) hair and dander: Secondary | ICD-10-CM | POA: Diagnosis not present

## 2020-10-09 DIAGNOSIS — J301 Allergic rhinitis due to pollen: Secondary | ICD-10-CM | POA: Diagnosis not present

## 2020-10-09 DIAGNOSIS — J3089 Other allergic rhinitis: Secondary | ICD-10-CM | POA: Diagnosis not present

## 2020-10-16 DIAGNOSIS — J301 Allergic rhinitis due to pollen: Secondary | ICD-10-CM | POA: Diagnosis not present

## 2020-10-16 DIAGNOSIS — J3089 Other allergic rhinitis: Secondary | ICD-10-CM | POA: Diagnosis not present

## 2020-10-18 ENCOUNTER — Ambulatory Visit: Payer: BC Managed Care – PPO | Admitting: Podiatry

## 2020-10-18 ENCOUNTER — Other Ambulatory Visit: Payer: Self-pay

## 2020-10-18 ENCOUNTER — Ambulatory Visit (INDEPENDENT_AMBULATORY_CARE_PROVIDER_SITE_OTHER): Payer: BC Managed Care – PPO

## 2020-10-18 DIAGNOSIS — M722 Plantar fascial fibromatosis: Secondary | ICD-10-CM

## 2020-10-18 DIAGNOSIS — G8929 Other chronic pain: Secondary | ICD-10-CM | POA: Diagnosis not present

## 2020-10-18 DIAGNOSIS — M79672 Pain in left foot: Secondary | ICD-10-CM | POA: Diagnosis not present

## 2020-10-18 MED ORDER — TRIAMCINOLONE ACETONIDE 10 MG/ML IJ SUSP
10.0000 mg | Freq: Once | INTRAMUSCULAR | Status: AC
  Administered 2020-10-18: 10 mg

## 2020-10-18 NOTE — Patient Instructions (Signed)
For instructions on how to put on your Plantar Fascial Brace, please visit [email or web address]   Plantar Fasciitis (Heel Spur Syndrome) with Rehab The plantar fascia is a fibrous, ligament-like, soft-tissue structure that spans the bottom of the foot. Plantar fasciitis is a condition that causes pain in the foot due to inflammation of the tissue. SYMPTOMS   Pain and tenderness on the underneath side of the foot.  Pain that worsens with standing or walking. CAUSES  Plantar fasciitis is caused by irritation and injury to the plantar fascia on the underneath side of the foot. Common mechanisms of injury include:  Direct trauma to bottom of the foot.  Damage to a small nerve that runs under the foot where the main fascia attaches to the heel bone.  Stress placed on the plantar fascia due to bone spurs. RISK INCREASES WITH:   Activities that place stress on the plantar fascia (running, jumping, pivoting, or cutting).  Poor strength and flexibility.  Improperly fitted shoes.  Tight calf muscles.  Flat feet.  Failure to warm-up properly before activity.  Obesity. PREVENTION  Warm up and stretch properly before activity.  Allow for adequate recovery between workouts.  Maintain physical fitness:  Strength, flexibility, and endurance.  Cardiovascular fitness.  Maintain a health body weight.  Avoid stress on the plantar fascia.  Wear properly fitted shoes, including arch supports for individuals who have flat feet.  PROGNOSIS  If treated properly, then the symptoms of plantar fasciitis usually resolve without surgery. However, occasionally surgery is necessary.  RELATED COMPLICATIONS   Recurrent symptoms that may result in a chronic condition.  Problems of the lower back that are caused by compensating for the injury, such as limping.  Pain or weakness of the foot during push-off following surgery.  Chronic inflammation, scarring, and partial or complete  fascia tear, occurring more often from repeated injections.  TREATMENT  Treatment initially involves the use of ice and medication to help reduce pain and inflammation. The use of strengthening and stretching exercises may help reduce pain with activity, especially stretches of the Achilles tendon. These exercises may be performed at home or with a therapist. Your caregiver may recommend that you use heel cups of arch supports to help reduce stress on the plantar fascia. Occasionally, corticosteroid injections are given to reduce inflammation. If symptoms persist for greater than 6 months despite non-surgical (conservative), then surgery may be recommended.   MEDICATION   If pain medication is necessary, then nonsteroidal anti-inflammatory medications, such as aspirin and ibuprofen, or other minor pain relievers, such as acetaminophen, are often recommended.  Do not take pain medication within 7 days before surgery.  Prescription pain relievers may be given if deemed necessary by your caregiver. Use only as directed and only as much as you need.  Corticosteroid injections may be given by your caregiver. These injections should be reserved for the most serious cases, because they may only be given a certain number of times.  HEAT AND COLD  Cold treatment (icing) relieves pain and reduces inflammation. Cold treatment should be applied for 10 to 15 minutes every 2 to 3 hours for inflammation and pain and immediately after any activity that aggravates your symptoms. Use ice packs or massage the area with a piece of ice (ice massage).  Heat treatment may be used prior to performing the stretching and strengthening activities prescribed by your caregiver, physical therapist, or athletic trainer. Use a heat pack or soak the injury in warm water.  SEEK IMMEDIATE MEDICAL   CARE IF:  Treatment seems to offer no benefit, or the condition worsens.  Any medications produce adverse side effects.   EXERCISES- RANGE OF MOTION (ROM) AND STRETCHING EXERCISES - Plantar Fasciitis (Heel Spur Syndrome) These exercises may help you when beginning to rehabilitate your injury. Your symptoms may resolve with or without further involvement from your physician, physical therapist or athletic trainer. While completing these exercises, remember:   Restoring tissue flexibility helps normal motion to return to the joints. This allows healthier, less painful movement and activity.  An effective stretch should be held for at least 30 seconds.  A stretch should never be painful. You should only feel a gentle lengthening or release in the stretched tissue.  RANGE OF MOTION - Toe Extension, Flexion  Sit with your right / left leg crossed over your opposite knee.  Grasp your toes and gently pull them back toward the top of your foot. You should feel a stretch on the bottom of your toes and/or foot.  Hold this stretch for 10 seconds.  Now, gently pull your toes toward the bottom of your foot. You should feel a stretch on the top of your toes and or foot.  Hold this stretch for 10 seconds. Repeat  times. Complete this stretch 3 times per day.   RANGE OF MOTION - Ankle Dorsiflexion, Active Assisted  Remove shoes and sit on a chair that is preferably not on a carpeted surface.  Place right / left foot under knee. Extend your opposite leg for support.  Keeping your heel down, slide your right / left foot back toward the chair until you feel a stretch at your ankle or calf. If you do not feel a stretch, slide your bottom forward to the edge of the chair, while still keeping your heel down.  Hold this stretch for 10 seconds. Repeat 3 times. Complete this stretch 2 times per day.   STRETCH  Gastroc, Standing  Place hands on wall.  Extend right / left leg, keeping the front knee somewhat bent.  Slightly point your toes inward on your back foot.  Keeping your right / left heel on the floor and your  knee straight, shift your weight toward the wall, not allowing your back to arch.  You should feel a gentle stretch in the right / left calf. Hold this position for 10 seconds. Repeat 3 times. Complete this stretch 2 times per day.  STRETCH  Soleus, Standing  Place hands on wall.  Extend right / left leg, keeping the other knee somewhat bent.  Slightly point your toes inward on your back foot.  Keep your right / left heel on the floor, bend your back knee, and slightly shift your weight over the back leg so that you feel a gentle stretch deep in your back calf.  Hold this position for 10 seconds. Repeat 3 times. Complete this stretch 2 times per day.  STRETCH  Gastrocsoleus, Standing  Note: This exercise can place a lot of stress on your foot and ankle. Please complete this exercise only if specifically instructed by your caregiver.   Place the ball of your right / left foot on a step, keeping your other foot firmly on the same step.  Hold on to the wall or a rail for balance.  Slowly lift your other foot, allowing your body weight to press your heel down over the edge of the step.  You should feel a stretch in your right / left calf.  Hold this   position for 10 seconds.  Repeat this exercise with a slight bend in your right / left knee. Repeat 3 times. Complete this stretch 2 times per day.   STRENGTHENING EXERCISES - Plantar Fasciitis (Heel Spur Syndrome)  These exercises may help you when beginning to rehabilitate your injury. They may resolve your symptoms with or without further involvement from your physician, physical therapist or athletic trainer. While completing these exercises, remember:   Muscles can gain both the endurance and the strength needed for everyday activities through controlled exercises.  Complete these exercises as instructed by your physician, physical therapist or athletic trainer. Progress the resistance and repetitions only as guided.  STRENGTH -  Towel Curls  Sit in a chair positioned on a non-carpeted surface.  Place your foot on a towel, keeping your heel on the floor.  Pull the towel toward your heel by only curling your toes. Keep your heel on the floor. Repeat 3 times. Complete this exercise 2 times per day.  STRENGTH - Ankle Inversion  Secure one end of a rubber exercise band/tubing to a fixed object (table, pole). Loop the other end around your foot just before your toes.  Place your fists between your knees. This will focus your strengthening at your ankle.  Slowly, pull your big toe up and in, making sure the band/tubing is positioned to resist the entire motion.  Hold this position for 10 seconds.  Have your muscles resist the band/tubing as it slowly pulls your foot back to the starting position. Repeat 3 times. Complete this exercises 2 times per day.  Document Released: 05/19/2005 Document Revised: 08/11/2011 Document Reviewed: 08/31/2008 [employer] Patient Information 2014 [employer]. 

## 2020-10-19 NOTE — Progress Notes (Signed)
Subjective:   Patient ID: Lonnie Jones, male   DOB: 45 y.o.   MRN: [id number]   HPI 45 year old male presents the office today for concerns of left reoccurrence of the heel pain.  States that he has been having this issue for several years and is intermittent.  He states that has currently been flaring up now for the last several weeks.  He is previous been diagnosed with plantar fasciitis.  He states he gets pain mostly towards the end of the day when he is on his feet a lot but also when he first gets up.  No swelling.  No injury that he reports.  He tried changing shoes without significant improvement.  He has no other concerns today.   Review of Systems  All other systems reviewed and are negative.  Past Medical History:  Diagnosis Date  . Acute idiopathic gout involving toe 06/22/2017  . Allergic rhinitis due to allergen 08/25/2011  . Asthma   . GERD (gastroesophageal reflux disease)   . Hyperlipidemia LDL goal < 70 12/16/2010  . Migraine headache   . Type 2 diabetes mellitus with peripheral neuropathy ([place]) 08/08/2010   Qualifier: Diagnosis of  By: Lorelei Pont MD, Frederico Hamman      Past Surgical History:  Procedure Laterality Date  . APPENDECTOMY  2000  . CARPAL TUNNEL RELEASE    . KNEE SURGERY  2011     Current Outpatient Medications:  .  albuterol (VENTOLIN HFA) 108 (90 Base) MCG/ACT inhaler, Inhale 1-2 puffs into the lungs every 6 (six) hours as needed for wheezing or shortness of breath., Disp: 1 each, Rfl: 1 .  azelastine (ASTELIN) 0.1 % nasal spray, INHALE 2 SPRAYS IN EACH NOSTRIL TWICE A DAY, Disp: , Rfl:  .  celecoxib (CELEBREX) 200 MG capsule, Take 1 capsule (200 mg total) by mouth daily. (Patient not taking: Reported on 07/26/2020), Disp: 90 capsule, Rfl: 3 .  Continuous Blood Gluc Sensor (FREESTYLE LIBRE 2 SENSOR) MISC, 1 DEVICE BY DOES NOT APPLY ROUTE AS DIRECTED., Disp: 6 each, Rfl: 1 .  EPINEPHrine 0.3 mg/0.3 mL IJ SOAJ injection, See admin instructions. for allergic  reaction, Disp: , Rfl: 1 .  eszopiclone (LUNESTA) 1 MG TABS tablet, TAKE 1 TABLET BY MOUTH AT BEDTIME AS NEEDED FOR SLEEP. TAKE IMMEDIATELY BEFORE BEDTIME, Disp: 30 tablet, Rfl: 3 .  FARXIGA 10 MG TABS tablet, TAKE 1 TABLET (10 MG) BY MOUTH DAILY BEFORE BREAKFAST. TO FOLLOW 5MG  DOSE, Disp: 90 tablet, Rfl: 2 .  fluticasone (FLONASE) 50 MCG/ACT nasal spray, SPRAY 1 SPRAY INTO EACH NOSTRIL EVERY DAY, Disp: , Rfl:  .  gabapentin (NEURONTIN) 400 MG capsule, TAKE 1 CAPSULE BY MOUTH 3 TIMES DAILY., Disp: 180 capsule, Rfl: 1 .  glipiZIDE (GLUCOTROL) 5 MG tablet, Take 1 tablet (5 mg total) by mouth daily before breakfast AND 0.5 tablets (2.5 mg total) daily before supper., Disp: 135 tablet, Rfl: 3 .  glucose blood test strip, Use as instructed, Disp: 100 each, Rfl: 3 .  levocetirizine (XYZAL) 5 MG tablet, Take 5 mg by mouth every evening., Disp: , Rfl:  .  meloxicam (MOBIC) 15 MG tablet, Take 1 tablet (15 mg total) by mouth daily., Disp: 90 tablet, Rfl: 1 .  metFORMIN (GLUCOPHAGE-XR) 500 MG 24 hr tablet, Take 2 tablets (1,000 mg total) by mouth daily with breakfast., Disp: 180 tablet, Rfl: 1 .  montelukast (SINGULAIR) 10 MG tablet, Take 1 tablet (10 mg total) by mouth daily., Disp: 90 tablet, Rfl: 3 .  pioglitazone (  ACTOS) 45 MG tablet, TAKE 1 TABLET BY MOUTH EVERY DAY, Disp: 90 tablet, Rfl: 3 .  pravastatin (PRAVACHOL) 20 MG tablet, TAKE 1 TABLET BY MOUTH EVERY DAY, Disp: 90 tablet, Rfl: 9  Allergies  Allergen Reactions  . Codeine          Objective:  Physical Exam  General: AAO x3, NAD  Dermatological: Skin is warm, dry and supple bilateral.  There are no open sores, no preulcerative lesions, no rash or signs of infection present.  Vascular: Dorsalis Pedis artery and Posterior Tibial artery pedal pulses are 2/4 bilateral with immedate capillary fill time.  There is no pain with calf compression, swelling, warmth, erythema.   Neruologic: Grossly intact via light touch bilateral.  Negative Tinel  sign.  Musculoskeletal: Tenderness to palpation along the plantar medial tubercle of the calcaneus at the insertion of plantar fascia on the left foot. There is no pain along the course of the plantar fascia within the arch of the foot. Plantar fascia appears to be intact. There is no pain with lateral compression of the calcaneus or pain with vibratory sensation. There is no pain along the course or insertion of the achilles tendon. No other areas of tenderness to bilateral lower extremities.Muscular strength 5/5 in all groups tested bilateral.  Gait: Unassisted, Nonantalgic.       Assessment:   Left heel pain, plantar fasciitis     Plan:  -Treatment options discussed including all alternatives, risks, and complications -Etiology of symptoms were discussed -X-rays were obtained and reviewed with the patient.  No evidence of acute fracture or stress fracture.  On lateral view there is radiated lucency in the calcaneus but otherwise notes of fracture. -Steroid injection performed.  See procedure note below. -Plantar fascial brace dispensed -He is currently on meloxicam and to continue with this. -Discussed stretching, icing daily.  Discussed shoe modifications and orthotics. -Given the chronic nature of his heel pain and reoccurrence would order an MRI to rule out any other underlying pathology or bone abnormality given the x-ray.  Procedure: Injection Tendon/Ligament Discussed alternatives, risks, complications and verbal consent was obtained.  Location: Left plantar fascia at the glabrous junction; medial approach. Skin Prep: Alcohol. Injectate: 0.5cc 0.5% marcaine plain, 0.5 cc 2% lidocaine plain and, 1 cc kenalog 10. Disposition: Patient tolerated procedure well. Injection site dressed with a band-aid.  Post-injection care was discussed and return precautions discussed.    Trula Slade DPM

## 2020-10-23 DIAGNOSIS — J301 Allergic rhinitis due to pollen: Secondary | ICD-10-CM | POA: Diagnosis not present

## 2020-10-23 DIAGNOSIS — J3089 Other allergic rhinitis: Secondary | ICD-10-CM | POA: Diagnosis not present

## 2020-10-30 ENCOUNTER — Telehealth: Payer: Self-pay | Admitting: *Deleted

## 2020-10-30 DIAGNOSIS — J301 Allergic rhinitis due to pollen: Secondary | ICD-10-CM | POA: Diagnosis not present

## 2020-10-30 DIAGNOSIS — J3089 Other allergic rhinitis: Secondary | ICD-10-CM | POA: Diagnosis not present

## 2020-10-30 NOTE — Telephone Encounter (Signed)
MRI scheduled on 11-05-2020 at 7:40 am. Lattie Haw

## 2020-10-30 NOTE — Telephone Encounter (Signed)
-----   Message from Trula Slade, DPM sent at 10/19/2020  3:03 PM EDT ----- I ordered an MRI if you can please follow up on this. Thanks!

## 2020-11-05 ENCOUNTER — Other Ambulatory Visit: Payer: Self-pay

## 2020-11-05 ENCOUNTER — Ambulatory Visit
Admission: RE | Admit: 2020-11-05 | Discharge: 2020-11-05 | Disposition: A | Discharge status: Home / Self Care | Payer: BC Managed Care – PPO | Source: Ambulatory Visit | Attending: Podiatry | Admitting: Podiatry

## 2020-11-05 DIAGNOSIS — M722 Plantar fascial fibromatosis: Secondary | ICD-10-CM | POA: Diagnosis not present

## 2020-11-05 DIAGNOSIS — R600 Localized edema: Secondary | ICD-10-CM | POA: Diagnosis not present

## 2020-11-05 DIAGNOSIS — M7989 Other specified soft tissue disorders: Secondary | ICD-10-CM | POA: Diagnosis not present

## 2020-11-06 DIAGNOSIS — J301 Allergic rhinitis due to pollen: Secondary | ICD-10-CM | POA: Diagnosis not present

## 2020-11-06 DIAGNOSIS — J3089 Other allergic rhinitis: Secondary | ICD-10-CM | POA: Diagnosis not present

## 2020-11-15 DIAGNOSIS — J301 Allergic rhinitis due to pollen: Secondary | ICD-10-CM | POA: Diagnosis not present

## 2020-11-15 DIAGNOSIS — J3089 Other allergic rhinitis: Secondary | ICD-10-CM | POA: Diagnosis not present

## 2020-11-20 DIAGNOSIS — J3089 Other allergic rhinitis: Secondary | ICD-10-CM | POA: Diagnosis not present

## 2020-11-20 DIAGNOSIS — J301 Allergic rhinitis due to pollen: Secondary | ICD-10-CM | POA: Diagnosis not present

## 2020-11-29 ENCOUNTER — Ambulatory Visit (INDEPENDENT_AMBULATORY_CARE_PROVIDER_SITE_OTHER): Payer: BC Managed Care – PPO | Admitting: Internal Medicine

## 2020-11-29 ENCOUNTER — Other Ambulatory Visit: Payer: Self-pay

## 2020-11-29 ENCOUNTER — Encounter: Payer: Self-pay | Admitting: Internal Medicine

## 2020-11-29 VITALS — BP 138/72 | HR 96 | Ht 64.0 in | Wt 231.0 lb

## 2020-11-29 DIAGNOSIS — E1142 Type 2 diabetes mellitus with diabetic polyneuropathy: Secondary | ICD-10-CM | POA: Diagnosis not present

## 2020-11-29 LAB — POCT GLYCOSYLATED HEMOGLOBIN (HGB A1C): Hemoglobin A1C: 6.7 % — AB (ref 4.0–5.6)

## 2020-11-29 NOTE — Progress Notes (Signed)
Name: Lonnie Jones  Age/ Sex: 45 y.o., male   MRN/ DOB: [id number], 1976/05/15     PCP: Owens Loffler, MD   Reason for Endocrinology Evaluation: Type 2 Diabetes Mellitus  Initial Endocrine Consultative Visit: 05/25/2019    PATIENT IDENTIFIER: Lonnie Jones is a 45 y.o. male with a past medical history of T2DM, Asthma,and  Dyslipidemia. The patient has followed with Endocrinology clinic since 05/25/2019 for consultative assistance with management of his diabetes.  DIABETIC HISTORY:  Lonnie Jones was diagnosed with T2DM in 2016, he has been on oral glycemic agents since his diagnosis. No prior use of insulin. His hemoglobin A1c has ranged from  6.9% in 2019, peaking at 8.5% in 07/2017.  On his initial visit to our clinic his A1c was 7.5%. He was on metformin, glipizide and pioglitazone. We reduced Metformin due to GI side effects and started farxiga.   Works in sewer and drain cleaning  SUBJECTIVE:   During the last visit (07/26/2020): A1c 6.8%. We continued  Glipizide, pioglitazone , metformin and  farxiga.     Today (11/29/2020): Lonnie Jones is here for a follow up on diabetes management.  He checks his blood sugars a few times a day through the CGM .The patient has had hypoglycemic episodes since the last clinic visit.   Denies any complaints  Denies nausea or diarrhea      HOME DIABETES REGIMEN:  Metformin 500 mg XR, 2 tabs daily  Farxiga 10 mg daily  Glipizide 5 mg, 1 tab before breakfast and half before supper  Pioglitazone 45 mg daily      Statin: Yes ACE-I/ARB: Yes   CONTINUOUS GLUCOSE MONITORING RECORD INTERPRETATION    Dates of Recording: 6/17-6/30/2022  Sensor description: freestyle libre  Results statistics:   CGM use % of time 68  Average and SD 152/30.5  Time in range  75      %  % Time Above 180 21  % Time above 250 4  % Time Below target 0       Glycemic patterns summary: hyperglycemia noted mainly after supper , trends down over  night   Hyperglycemic episodes  postprandial  Hypoglycemic episodes occurred fasting  Overnight periods: trends down       DIABETIC COMPLICATIONS: Microvascular complications:  Neuropathy Denies: CKD, retinopathy  Last eye exam: Completed 02/2020   Macrovascular complications:  Denies: CAD, PVD, CVA   HISTORY:  Past Medical History:  Past Medical History:  Diagnosis Date   Acute idiopathic gout involving toe 06/22/2017   Allergic rhinitis due to allergen 08/25/2011   Asthma    GERD (gastroesophageal reflux disease)    Hyperlipidemia LDL goal < 70 12/16/2010   Migraine headache    Type 2 diabetes mellitus with peripheral neuropathy ([place]) 08/08/2010   Qualifier: Diagnosis of  By: Lorelei Pont MD, Frederico Hamman     Past Surgical History:  Past Surgical History:  Procedure Laterality Date   APPENDECTOMY  2000   CARPAL TUNNEL RELEASE     KNEE SURGERY  2011   Social History:  reports that he has quit smoking. His smokeless tobacco use includes snuff. He reports that he does not drink alcohol and does not use drugs. Family History:  Family History  Problem Relation Age of Onset   Healthy Mother    Healthy Father      HOME MEDICATIONS: Allergies as of 11/29/2020       Reactions   Codeine  Medication List        Accurate as of November 29, 2020  8:58 AM. If you have any questions, ask your nurse or doctor.          albuterol 108 (90 Base) MCG/ACT inhaler Commonly known as: VENTOLIN HFA Inhale 1-2 puffs into the lungs every 6 (six) hours as needed for wheezing or shortness of breath.   azelastine 0.1 % nasal spray Commonly known as: ASTELIN INHALE 2 SPRAYS IN EACH NOSTRIL TWICE A DAY   celecoxib 200 MG capsule Commonly known as: CeleBREX Take 1 capsule (200 mg total) by mouth daily.   EPINEPHrine 0.3 mg/0.3 mL Soaj injection Commonly known as: EPI-PEN See admin instructions. for allergic reaction   eszopiclone 1 MG Tabs tablet Commonly known as:  LUNESTA TAKE 1 TABLET BY MOUTH AT BEDTIME AS NEEDED FOR SLEEP. TAKE IMMEDIATELY BEFORE BEDTIME   Farxiga 10 MG Tabs tablet Generic drug: dapagliflozin propanediol TAKE 1 TABLET (10 MG) BY MOUTH DAILY BEFORE BREAKFAST. TO FOLLOW 5MG  DOSE   fluticasone 50 MCG/ACT nasal spray Commonly known as: FLONASE SPRAY 1 SPRAY INTO EACH NOSTRIL EVERY DAY   FreeStyle Libre 2 Sensor Misc 1 DEVICE BY DOES NOT APPLY ROUTE AS DIRECTED.   gabapentin 400 MG capsule Commonly known as: NEURONTIN TAKE 1 CAPSULE BY MOUTH 3 TIMES DAILY.   glipiZIDE 5 MG tablet Commonly known as: GLUCOTROL Take 1 tablet (5 mg total) by mouth daily before breakfast AND 0.5 tablets (2.5 mg total) daily before supper.   glucose blood test strip Use as instructed   levocetirizine 5 MG tablet Commonly known as: XYZAL Take 5 mg by mouth every evening.   meloxicam 15 MG tablet Commonly known as: MOBIC Take 1 tablet (15 mg total) by mouth daily.   metFORMIN 500 MG 24 hr tablet Commonly known as: GLUCOPHAGE-XR Take 2 tablets (1,000 mg total) by mouth daily with breakfast.   montelukast 10 MG tablet Commonly known as: SINGULAIR Take 1 tablet (10 mg total) by mouth daily.   pioglitazone 45 MG tablet Commonly known as: ACTOS TAKE 1 TABLET BY MOUTH EVERY DAY   pravastatin 20 MG tablet Commonly known as: PRAVACHOL TAKE 1 TABLET BY MOUTH EVERY DAY         OBJECTIVE:   Vital Signs: BP 138/72   Pulse 96   Ht 5\' 4"  (1.626 m)   Wt 231 lb (104.8 kg)   SpO2 97%   BMI 39.65 kg/m   Wt Readings from Last 3 Encounters:  11/29/20 231 lb (104.8 kg)  07/26/20 231 lb 8 oz (105 kg)  04/02/20 232 lb 4 oz (105.3 kg)     Exam: General: Pt appears well and is in NAD  Lungs: Clear with good BS bilat with no rales, rhonchi, or wheezes  Heart: RRR with normal S1 and S2 and no gallops; no murmurs; no rub  Abdomen: Normoactive bowel sounds, soft, nontender, without masses or organomegaly palpable  Extremities: No pretibial  edema.   Neuro: MS is good with appropriate affect, pt is alert and Ox3    DM foot exam: 07/26/2020 The skin of the feet is intact without sores or ulcerations. The pedal pulses are 1+ on right and 1+ on left. The sensation is intact to a screening 5.07, 10 gram monofilament bilaterally    DATA REVIEWED:  Lab Results  Component Value Date   HGBA1C 6.8 (A) 07/26/2020   HGBA1C 6.3 (A) 03/22/2020   HGBA1C 6.0 (A) 11/21/2019   Lab Results  Component Value Date   MICROALBUR 8.0 (H) 11/12/2018   LDLCALC 69 04/02/2020   CREATININE 0.73 04/02/2020   Lab Results  Component Value Date   MICRALBCREAT 10.9 11/12/2018     Lab Results  Component Value Date   CHOL 140 04/02/2020   HDL 44.30 04/02/2020   LDLCALC 69 04/02/2020   LDLDIRECT 88.0 08/15/2016   TRIG 135.0 04/02/2020   CHOLHDL 3 04/02/2020         ASSESSMENT / PLAN / RECOMMENDATIONS:   1) Type 2 Diabetes Mellitus, Optimally controlled, With Neuropathic complications - Most recent A1c of 6.7%. Goal A1c < 7.0 %.    -A1c remains at goal, but in review of his CGM download the patient has had sporadic hypoglycemic episode with the lowest at 69 mg/DL.  He does tend to have inconsistent eating pattern which results in glycemic excursions. -We have discussed holding glipizide half a tablet before supper from now on unless he is going to have a large meal. - Intolerant to higher doses of metformin   MEDICATIONS: - Glipizide 5 mg,  1 tablet before Breakfast and may take half a tablet before supper if going to eat a larger than usual meal - Continue Metformin 500 mg XR, 2 tabs daily  - Continue Farxiga 10 mg tablets - Continue Pioglitazone 45 mg daily   EDUCATION / INSTRUCTIONS: BG monitoring instructions: Patient is instructed to check his blood sugars 1 times a day. Call [facility] if: BG persistently < 70  I reviewed the Rule of 15 for the treatment of hypoglycemia in detail with the patient. Literature  supplied.    F/U in 4 months   Signed electronically by: Mack Guise, MD  [facility]  [facility] [address] [place], [place] [address] Phone: [phone] FAX: [phone]   CC: Owens Loffler, [place] [address] Phone: [phone]  Fax: [phone]  Return to Endocrinology clinic as below: Future Appointments  Date Time Provider [place]  11/29/2020  9:10 AM Zelig Gacek, Melanie Crazier, MD [id number] None  12/04/2020  9:15 AM Trula Slade, DPM [id number]

## 2020-11-29 NOTE — Patient Instructions (Addendum)
-   Continue  Glipizide 5 mg  1 tablet before Breakfast , use half a tablet before supper if you are having a bigger meal then usual - Continue Metformin 1000 mg once daily  - Continue Farxiga 10 mg tablets - Continue Pioglitazone 45 mg daily         HOW TO TREAT LOW BLOOD SUGARS (Blood sugar LESS THAN 70 MG/DL) Please follow the RULE OF 15 for the treatment of hypoglycemia treatment (when your (blood sugars are less than 70 mg/dL)   STEP 1: Take 15 grams of carbohydrates when your blood sugar is low, which includes:  3-4 GLUCOSE TABS  OR 3-4 OZ OF JUICE OR REGULAR SODA OR ONE TUBE OF GLUCOSE GEL    STEP 2: RECHECK blood sugar in 15 MINUTES STEP 3: If your blood sugar is still low at the 15 minute recheck --> then, go back to STEP 1 and treat AGAIN with another 15 grams of carbohydrates.

## 2020-12-04 ENCOUNTER — Ambulatory Visit: Payer: BC Managed Care – PPO | Admitting: Podiatry

## 2020-12-04 ENCOUNTER — Other Ambulatory Visit: Payer: Self-pay

## 2020-12-04 ENCOUNTER — Encounter: Payer: Self-pay | Admitting: Podiatry

## 2020-12-04 DIAGNOSIS — M722 Plantar fascial fibromatosis: Secondary | ICD-10-CM | POA: Diagnosis not present

## 2020-12-04 DIAGNOSIS — J301 Allergic rhinitis due to pollen: Secondary | ICD-10-CM | POA: Diagnosis not present

## 2020-12-04 DIAGNOSIS — S96912S Strain of unspecified muscle and tendon at ankle and foot level, left foot, sequela: Secondary | ICD-10-CM | POA: Diagnosis not present

## 2020-12-04 NOTE — Patient Instructions (Signed)
I have ordered a referral to [facility]. If you do not hear for them about scheduling within the next 1 week, or you have any questions please give [place] a call at [phone].

## 2020-12-05 DIAGNOSIS — J3089 Other allergic rhinitis: Secondary | ICD-10-CM | POA: Diagnosis not present

## 2020-12-06 DIAGNOSIS — J301 Allergic rhinitis due to pollen: Secondary | ICD-10-CM | POA: Diagnosis not present

## 2020-12-06 DIAGNOSIS — J3089 Other allergic rhinitis: Secondary | ICD-10-CM | POA: Diagnosis not present

## 2020-12-06 NOTE — Progress Notes (Signed)
Subjective: 45 year old male presents the office today for follow evaluation of left heel pain also discussed MRI results.  He states that the brace eventually wore out he had a persistent over-the-counter brace which is been helping some.  Some improvement but still gets discomfort.  This is been a chronic issue and has had a reoccurrence of pain over many years. Denies any systemic complaints such as fevers, chills, nausea, vomiting. No acute changes since last appointment, and no other complaints at this time.   Objective: AAO x3, NAD DP/PT pulses palpable bilaterally, CRT less than 3 seconds Discontinuation tenderness palpation on the plantar medial tubercle of the calcaneus at insertion plantar fascial area the plantar fashion appears to be intact.  There is no pain with all compression of calcaneus.  There is no pain today on the course the peroneal tendon.  No edema.  MMT 5/5. No pain with calf compression, swelling, warmth, erythema  Assessment: Plantar fasciitis left side with partial tear peroneus brevis  Plan: -All treatment options discussed with the patient including all alternatives, risks, complications.  -I reviewed the MRI with him.  Discussed conservative as well as surgical options.  He has been doing numerous conservative treatment for any significant improvement.  Discussed alternative treatments.  After discussion he wants to continue with wearing the brace, supportive shoes and the brace.  Referred to physical therapy as well.  [facility]cription for [facility] written today. -Patient encouraged to call the office with any questions, concerns, change in symptoms.   Trula Slade DPM

## 2020-12-11 DIAGNOSIS — J301 Allergic rhinitis due to pollen: Secondary | ICD-10-CM | POA: Diagnosis not present

## 2020-12-11 DIAGNOSIS — J3089 Other allergic rhinitis: Secondary | ICD-10-CM | POA: Diagnosis not present

## 2020-12-12 DIAGNOSIS — R262 Difficulty in walking, not elsewhere classified: Secondary | ICD-10-CM | POA: Diagnosis not present

## 2020-12-12 DIAGNOSIS — M25572 Pain in left ankle and joints of left foot: Secondary | ICD-10-CM | POA: Diagnosis not present

## 2020-12-12 DIAGNOSIS — R531 Weakness: Secondary | ICD-10-CM | POA: Diagnosis not present

## 2020-12-13 DIAGNOSIS — R262 Difficulty in walking, not elsewhere classified: Secondary | ICD-10-CM | POA: Diagnosis not present

## 2020-12-13 DIAGNOSIS — M25572 Pain in left ankle and joints of left foot: Secondary | ICD-10-CM | POA: Diagnosis not present

## 2020-12-13 DIAGNOSIS — R531 Weakness: Secondary | ICD-10-CM | POA: Diagnosis not present

## 2020-12-18 DIAGNOSIS — R531 Weakness: Secondary | ICD-10-CM | POA: Diagnosis not present

## 2020-12-18 DIAGNOSIS — J301 Allergic rhinitis due to pollen: Secondary | ICD-10-CM | POA: Diagnosis not present

## 2020-12-18 DIAGNOSIS — J3089 Other allergic rhinitis: Secondary | ICD-10-CM | POA: Diagnosis not present

## 2020-12-18 DIAGNOSIS — M25572 Pain in left ankle and joints of left foot: Secondary | ICD-10-CM | POA: Diagnosis not present

## 2020-12-18 DIAGNOSIS — R262 Difficulty in walking, not elsewhere classified: Secondary | ICD-10-CM | POA: Diagnosis not present

## 2020-12-21 DIAGNOSIS — R262 Difficulty in walking, not elsewhere classified: Secondary | ICD-10-CM | POA: Diagnosis not present

## 2020-12-21 DIAGNOSIS — R531 Weakness: Secondary | ICD-10-CM | POA: Diagnosis not present

## 2020-12-21 DIAGNOSIS — M25572 Pain in left ankle and joints of left foot: Secondary | ICD-10-CM | POA: Diagnosis not present

## 2020-12-24 DIAGNOSIS — R262 Difficulty in walking, not elsewhere classified: Secondary | ICD-10-CM | POA: Diagnosis not present

## 2020-12-24 DIAGNOSIS — R531 Weakness: Secondary | ICD-10-CM | POA: Diagnosis not present

## 2020-12-24 DIAGNOSIS — M25572 Pain in left ankle and joints of left foot: Secondary | ICD-10-CM | POA: Diagnosis not present

## 2020-12-27 DIAGNOSIS — J301 Allergic rhinitis due to pollen: Secondary | ICD-10-CM | POA: Diagnosis not present

## 2020-12-27 DIAGNOSIS — J3089 Other allergic rhinitis: Secondary | ICD-10-CM | POA: Diagnosis not present

## 2020-12-28 DIAGNOSIS — M25572 Pain in left ankle and joints of left foot: Secondary | ICD-10-CM | POA: Diagnosis not present

## 2020-12-28 DIAGNOSIS — R531 Weakness: Secondary | ICD-10-CM | POA: Diagnosis not present

## 2020-12-28 DIAGNOSIS — R262 Difficulty in walking, not elsewhere classified: Secondary | ICD-10-CM | POA: Diagnosis not present

## 2021-01-01 DIAGNOSIS — J301 Allergic rhinitis due to pollen: Secondary | ICD-10-CM | POA: Diagnosis not present

## 2021-01-01 DIAGNOSIS — M25572 Pain in left ankle and joints of left foot: Secondary | ICD-10-CM | POA: Diagnosis not present

## 2021-01-01 DIAGNOSIS — J3089 Other allergic rhinitis: Secondary | ICD-10-CM | POA: Diagnosis not present

## 2021-01-01 DIAGNOSIS — R531 Weakness: Secondary | ICD-10-CM | POA: Diagnosis not present

## 2021-01-01 DIAGNOSIS — R262 Difficulty in walking, not elsewhere classified: Secondary | ICD-10-CM | POA: Diagnosis not present

## 2021-01-04 DIAGNOSIS — R262 Difficulty in walking, not elsewhere classified: Secondary | ICD-10-CM | POA: Diagnosis not present

## 2021-01-04 DIAGNOSIS — M25572 Pain in left ankle and joints of left foot: Secondary | ICD-10-CM | POA: Diagnosis not present

## 2021-01-04 DIAGNOSIS — R531 Weakness: Secondary | ICD-10-CM | POA: Diagnosis not present

## 2021-01-07 DIAGNOSIS — R262 Difficulty in walking, not elsewhere classified: Secondary | ICD-10-CM | POA: Diagnosis not present

## 2021-01-07 DIAGNOSIS — R531 Weakness: Secondary | ICD-10-CM | POA: Diagnosis not present

## 2021-01-07 DIAGNOSIS — M25572 Pain in left ankle and joints of left foot: Secondary | ICD-10-CM | POA: Diagnosis not present

## 2021-01-08 DIAGNOSIS — J301 Allergic rhinitis due to pollen: Secondary | ICD-10-CM | POA: Diagnosis not present

## 2021-01-08 DIAGNOSIS — J3089 Other allergic rhinitis: Secondary | ICD-10-CM | POA: Diagnosis not present

## 2021-01-11 DIAGNOSIS — R262 Difficulty in walking, not elsewhere classified: Secondary | ICD-10-CM | POA: Diagnosis not present

## 2021-01-11 DIAGNOSIS — R531 Weakness: Secondary | ICD-10-CM | POA: Diagnosis not present

## 2021-01-11 DIAGNOSIS — M25572 Pain in left ankle and joints of left foot: Secondary | ICD-10-CM | POA: Diagnosis not present

## 2021-01-14 DIAGNOSIS — R262 Difficulty in walking, not elsewhere classified: Secondary | ICD-10-CM | POA: Diagnosis not present

## 2021-01-14 DIAGNOSIS — M25572 Pain in left ankle and joints of left foot: Secondary | ICD-10-CM | POA: Diagnosis not present

## 2021-01-14 DIAGNOSIS — R531 Weakness: Secondary | ICD-10-CM | POA: Diagnosis not present

## 2021-01-15 ENCOUNTER — Other Ambulatory Visit: Payer: Self-pay | Admitting: Internal Medicine

## 2021-01-15 DIAGNOSIS — J3089 Other allergic rhinitis: Secondary | ICD-10-CM | POA: Diagnosis not present

## 2021-01-15 DIAGNOSIS — J301 Allergic rhinitis due to pollen: Secondary | ICD-10-CM | POA: Diagnosis not present

## 2021-01-18 DIAGNOSIS — M25572 Pain in left ankle and joints of left foot: Secondary | ICD-10-CM | POA: Diagnosis not present

## 2021-01-18 DIAGNOSIS — R262 Difficulty in walking, not elsewhere classified: Secondary | ICD-10-CM | POA: Diagnosis not present

## 2021-01-18 DIAGNOSIS — R531 Weakness: Secondary | ICD-10-CM | POA: Diagnosis not present

## 2021-01-22 DIAGNOSIS — J3089 Other allergic rhinitis: Secondary | ICD-10-CM | POA: Diagnosis not present

## 2021-01-22 DIAGNOSIS — J301 Allergic rhinitis due to pollen: Secondary | ICD-10-CM | POA: Diagnosis not present

## 2021-01-24 ENCOUNTER — Other Ambulatory Visit: Payer: Self-pay | Admitting: Family Medicine

## 2021-01-24 DIAGNOSIS — J301 Allergic rhinitis due to pollen: Secondary | ICD-10-CM | POA: Diagnosis not present

## 2021-01-24 DIAGNOSIS — J3089 Other allergic rhinitis: Secondary | ICD-10-CM | POA: Diagnosis not present

## 2021-01-24 NOTE — Telephone Encounter (Signed)
Last office visit 04/02/2020 for CPE.  Last refilled tizanidine listed as historical medication.  Last refilled 01/31/2020 for #90 with no refills.  Lunesta 09/23/2020 for #30 with 3 refills.  No future appointments with PCP.

## 2021-01-29 DIAGNOSIS — J301 Allergic rhinitis due to pollen: Secondary | ICD-10-CM | POA: Diagnosis not present

## 2021-01-29 DIAGNOSIS — J3089 Other allergic rhinitis: Secondary | ICD-10-CM | POA: Diagnosis not present

## 2021-01-30 DIAGNOSIS — R262 Difficulty in walking, not elsewhere classified: Secondary | ICD-10-CM | POA: Diagnosis not present

## 2021-01-30 DIAGNOSIS — R531 Weakness: Secondary | ICD-10-CM | POA: Diagnosis not present

## 2021-01-30 DIAGNOSIS — M25572 Pain in left ankle and joints of left foot: Secondary | ICD-10-CM | POA: Diagnosis not present

## 2021-02-06 DIAGNOSIS — M25572 Pain in left ankle and joints of left foot: Secondary | ICD-10-CM | POA: Diagnosis not present

## 2021-02-06 DIAGNOSIS — R262 Difficulty in walking, not elsewhere classified: Secondary | ICD-10-CM | POA: Diagnosis not present

## 2021-02-06 DIAGNOSIS — R531 Weakness: Secondary | ICD-10-CM | POA: Diagnosis not present

## 2021-02-07 ENCOUNTER — Other Ambulatory Visit: Payer: Self-pay | Admitting: Family Medicine

## 2021-02-07 DIAGNOSIS — J3089 Other allergic rhinitis: Secondary | ICD-10-CM | POA: Diagnosis not present

## 2021-02-07 DIAGNOSIS — J301 Allergic rhinitis due to pollen: Secondary | ICD-10-CM | POA: Diagnosis not present

## 2021-02-07 NOTE — Telephone Encounter (Signed)
Left message for Lonnie Jones to return my call in regards to refill request we received today.

## 2021-02-07 NOTE — Telephone Encounter (Signed)
?    He should not be taking both of them - both NSAIDS.   Which one is he using?  I will refill one but not both.

## 2021-02-07 NOTE — Telephone Encounter (Signed)
Patient returned call stated he no longer takes Celebrex  and only takes Meloxicam as needed if any question please call him back

## 2021-02-07 NOTE — Telephone Encounter (Signed)
Can you approve meloxicam and deny celebrex.  I am having a difficult time doing it.

## 2021-02-07 NOTE — Telephone Encounter (Signed)
Last office visit 04/02/2020 for CPE.  Last refilled Meloxicam 07/17/2020 for #90 with 1 refill.  Celebrex 04/02/2020 for 04/02/2020 for #90 with 3 refills. No future appointments with PCP.

## 2021-02-14 DIAGNOSIS — J301 Allergic rhinitis due to pollen: Secondary | ICD-10-CM | POA: Diagnosis not present

## 2021-02-14 DIAGNOSIS — J3089 Other allergic rhinitis: Secondary | ICD-10-CM | POA: Diagnosis not present

## 2021-02-21 DIAGNOSIS — J3089 Other allergic rhinitis: Secondary | ICD-10-CM | POA: Diagnosis not present

## 2021-02-21 DIAGNOSIS — J301 Allergic rhinitis due to pollen: Secondary | ICD-10-CM | POA: Diagnosis not present

## 2021-02-27 ENCOUNTER — Other Ambulatory Visit: Payer: Self-pay | Admitting: Internal Medicine

## 2021-02-27 ENCOUNTER — Other Ambulatory Visit: Payer: Self-pay | Admitting: Family Medicine

## 2021-02-27 NOTE — Telephone Encounter (Signed)
I think that I just refilled a Meloxicam prescription for him?  He would have to take one or the other but not both.

## 2021-02-27 NOTE — Telephone Encounter (Signed)
Last office visit 04/02/2020 for CPE.  Last refilled 04/02/2020 for #90 with 3 refills.  CPE scheduled for 04/10/2021.

## 2021-03-01 DIAGNOSIS — J3089 Other allergic rhinitis: Secondary | ICD-10-CM | POA: Diagnosis not present

## 2021-03-01 DIAGNOSIS — J301 Allergic rhinitis due to pollen: Secondary | ICD-10-CM | POA: Diagnosis not present

## 2021-03-07 DIAGNOSIS — J301 Allergic rhinitis due to pollen: Secondary | ICD-10-CM | POA: Diagnosis not present

## 2021-03-07 DIAGNOSIS — J3089 Other allergic rhinitis: Secondary | ICD-10-CM | POA: Diagnosis not present

## 2021-03-08 DIAGNOSIS — E119 Type 2 diabetes mellitus without complications: Secondary | ICD-10-CM | POA: Diagnosis not present

## 2021-03-14 DIAGNOSIS — J301 Allergic rhinitis due to pollen: Secondary | ICD-10-CM | POA: Diagnosis not present

## 2021-03-14 DIAGNOSIS — J3089 Other allergic rhinitis: Secondary | ICD-10-CM | POA: Diagnosis not present

## 2021-03-26 DIAGNOSIS — J301 Allergic rhinitis due to pollen: Secondary | ICD-10-CM | POA: Diagnosis not present

## 2021-03-26 DIAGNOSIS — J3089 Other allergic rhinitis: Secondary | ICD-10-CM | POA: Diagnosis not present

## 2021-04-04 DIAGNOSIS — J3089 Other allergic rhinitis: Secondary | ICD-10-CM | POA: Diagnosis not present

## 2021-04-04 DIAGNOSIS — J301 Allergic rhinitis due to pollen: Secondary | ICD-10-CM | POA: Diagnosis not present

## 2021-04-10 ENCOUNTER — Other Ambulatory Visit: Payer: Self-pay

## 2021-04-10 ENCOUNTER — Ambulatory Visit (INDEPENDENT_AMBULATORY_CARE_PROVIDER_SITE_OTHER): Payer: BC Managed Care – PPO | Admitting: Family Medicine

## 2021-04-10 ENCOUNTER — Encounter: Payer: Self-pay | Admitting: Family Medicine

## 2021-04-10 VITALS — BP 112/60 | HR 84 | Temp 97.8°F | Ht 67.0 in | Wt 234.0 lb

## 2021-04-10 DIAGNOSIS — Z1159 Encounter for screening for other viral diseases: Secondary | ICD-10-CM

## 2021-04-10 DIAGNOSIS — E1142 Type 2 diabetes mellitus with diabetic polyneuropathy: Secondary | ICD-10-CM

## 2021-04-10 DIAGNOSIS — Z79899 Other long term (current) drug therapy: Secondary | ICD-10-CM | POA: Diagnosis not present

## 2021-04-10 DIAGNOSIS — Z23 Encounter for immunization: Secondary | ICD-10-CM

## 2021-04-10 DIAGNOSIS — Z7721 Contact with and (suspected) exposure to potentially hazardous body fluids: Secondary | ICD-10-CM

## 2021-04-10 DIAGNOSIS — Z1211 Encounter for screening for malignant neoplasm of colon: Secondary | ICD-10-CM | POA: Diagnosis not present

## 2021-04-10 DIAGNOSIS — E785 Hyperlipidemia, unspecified: Secondary | ICD-10-CM

## 2021-04-10 DIAGNOSIS — Z0000 Encounter for general adult medical examination without abnormal findings: Secondary | ICD-10-CM | POA: Diagnosis not present

## 2021-04-10 LAB — HEPATIC FUNCTION PANEL
ALT: 23 U/L (ref 0–53)
AST: 16 U/L (ref 0–37)
Albumin: 4.7 g/dL (ref 3.5–5.2)
Alkaline Phosphatase: 64 U/L (ref 39–117)
Bilirubin, Direct: 0.1 mg/dL (ref 0.0–0.3)
Total Bilirubin: 0.7 mg/dL (ref 0.2–1.2)
Total Protein: 7.1 g/dL (ref 6.0–8.3)

## 2021-04-10 LAB — LIPID PANEL
Cholesterol: 169 mg/dL (ref 0–200)
HDL: 41.4 mg/dL (ref >39.00)
NonHDL: 128.03
Total CHOL/HDL Ratio: 4
Triglycerides: 256 mg/dL — ABNORMAL HIGH (ref 0.0–149.0)
VLDL: 51.2 mg/dL — ABNORMAL HIGH (ref 0.0–40.0)

## 2021-04-10 LAB — CBC WITH DIFFERENTIAL/PLATELET
Basophils Absolute: 0 10*3/uL (ref 0.0–0.1)
Basophils Relative: 0.3 % (ref 0.0–3.0)
Eosinophils Absolute: 0.1 10*3/uL (ref 0.0–0.7)
Eosinophils Relative: 1.1 % (ref 0.0–5.0)
HCT: 50.8 % (ref 39.0–52.0)
Hemoglobin: 17.4 g/dL — ABNORMAL HIGH (ref 13.0–17.0)
Lymphocytes Relative: 25 % (ref 12.0–46.0)
Lymphs Abs: 1.4 10*3/uL (ref 0.7–4.0)
MCHC: 34.2 g/dL (ref 30.0–36.0)
MCV: 90.5 fl (ref 78.0–100.0)
Monocytes Absolute: 0.4 10*3/uL (ref 0.1–1.0)
Monocytes Relative: 7.2 % (ref 3.0–12.0)
Neutro Abs: 3.8 10*3/uL (ref 1.4–7.7)
Neutrophils Relative %: 66.4 % (ref 43.0–77.0)
Platelets: 197 10*3/uL (ref 150.0–400.0)
RBC: 5.61 Mil/uL (ref 4.22–5.81)
RDW: 12.6 % (ref 11.5–15.5)
WBC: 5.7 10*3/uL (ref 4.0–10.5)

## 2021-04-10 LAB — MICROALBUMIN / CREATININE URINE RATIO
Creatinine,U: 43.4 mg/dL
Microalb Creat Ratio: 2.6 mg/g (ref 0.0–30.0)
Microalb, Ur: 1.1 mg/dL (ref 0.0–1.9)

## 2021-04-10 LAB — BASIC METABOLIC PANEL
BUN: 27 mg/dL — ABNORMAL HIGH (ref 6–23)
CO2: 32 mEq/L (ref 19–32)
Calcium: 10.2 mg/dL (ref 8.4–10.5)
Chloride: 104 mEq/L (ref 96–112)
Creatinine, Ser: 0.86 mg/dL (ref 0.40–1.50)
GFR: 105.02 mL/min (ref >60.00)
Glucose, Bld: 129 mg/dL — ABNORMAL HIGH (ref 70–99)
Potassium: 5.2 mEq/L — ABNORMAL HIGH (ref 3.5–5.1)
Sodium: 142 mEq/L (ref 135–145)

## 2021-04-10 LAB — HEMOGLOBIN A1C: Hgb A1c MFr Bld: 7.1 % — ABNORMAL HIGH (ref 4.6–6.5)

## 2021-04-10 LAB — LDL CHOLESTEROL, DIRECT: Direct LDL: 99 mg/dL

## 2021-04-10 NOTE — Progress Notes (Signed)
Lonnie Jones T. Kymberlie Brazeau, MD, [place] [facility] [place], [address]  Phone: [phone]  FAX: [place] - 45 y.o. male  MRN [id number]  Date of Birth: 08-18-1975  Date: 04/10/2021  PCP: Owens Loffler, MD  Referral: Owens Loffler, MD  Chief Complaint  Patient presents with   Annual Exam    This visit occurred during the SARS-CoV-2 public health emergency.  Safety protocols were in place, including screening questions prior to the visit, additional usage of staff PPE, and extensive cleaning of exam room while observing appropriate contact time as indicated for disinfecting solutions.   Patient Care Team: Owens Loffler, MD as PCP - General (Family Medicine) Subjective:   Lonnie Jones is a 45 y.o. pleasant patient who presents with the following:  Preventative Health Maintenance Visit:  Health Maintenance Summary Reviewed and updated, unless pt declines services.  Tobacco History Reviewed.  Using a nicotine extract.  Zen, rogue.  Less than dip. Alcohol: No concerns, no excessive use Exercise Habits: Some activity, rec at least 30 mins 5 times a week - works on [employer] and cars.  Has 9 cars.  STD concerns: no risk or activity to increase risk Drug Use: None  All labs and urine Hep c Prevnar-20 Flu done  Diabetes Mellitus: Tolerating Medications: yes Compliance with diet: fair, Body mass index is 36.65 kg/m. Exercise: minimal / intermittent Avg blood sugars at home: not checking Foot problems: none Hypoglycemia: none No nausea, vomitting, blurred vision, polyuria.  Lab Results  Component Value Date   HGBA1C 6.7 (A) 11/29/2020   HGBA1C 6.8 (A) 07/26/2020   HGBA1C 6.3 (A) 03/22/2020   Lab Results  Component Value Date   MICROALBUR 8.0 (H) 11/12/2018   LDLCALC 69 04/02/2020   CREATININE 0.73 04/02/2020    Wt Readings from Last 3 Encounters:  04/10/21 234 lb (106.1 kg)   11/29/20 231 lb (104.8 kg)  07/26/20 231 lb 8 oz (105 kg)  ,   Health Maintenance  Topic Date Due   Hepatitis C Screening  Never done   COVID-19 Vaccine (3 - Pfizer risk series) 10/17/2019   URINE MICROALBUMIN  11/12/2019   FOOT EXAM  11/20/2020   OPHTHALMOLOGY EXAM  02/13/2021   HEMOGLOBIN A1C  05/31/2021   TETANUS/TDAP  07/28/2022   Pneumococcal Vaccine 74-52 Years old  Completed   INFLUENZA VACCINE  Completed   HIV Screening  Completed   HPV VACCINES  Aged Out   Immunization History  Administered Date(s) Administered   Influenza, High Dose Seasonal PF 09/27/2019, 08/28/2020   Influenza, Seasonal, Injecte, Preservative Fre 07/28/2012   Influenza,inj,Quad PF,6+ Mos 01/26/2013, 02/15/2015, 05/12/2016, 02/24/2018, 02/11/2019, 04/02/2020, 04/10/2021   PFIZER(Purple Top)SARS-COV-2 Vaccination 08/26/2019, 09/19/2019   PNEUMOCOCCAL CONJUGATE-20 04/10/2021   Pneumococcal Polysaccharide-23 07/28/2012, 08/24/2017   Tdap 07/28/2012   Patient Active Problem List   Diagnosis Date Noted   Peripheral autonomic neuropathy due to diabetes mellitus ([place]) 02/16/2015   Allergic rhinitis due to allergen 08/25/2011   Hyperlipidemia with target LDL less than 70 12/16/2010   Type 2 diabetes mellitus with peripheral neuropathy ([place]) 08/08/2010   MIGRAINE HEADACHE 08/08/2010   ASTHMA 08/08/2010    Past Medical History:  Diagnosis Date   Acute idiopathic gout involving toe 06/22/2017   Allergic rhinitis due to allergen 08/25/2011   Asthma    GERD (gastroesophageal reflux disease)    Hyperlipidemia LDL goal < 70 12/16/2010   Migraine headache  Type 2 diabetes mellitus with peripheral neuropathy ([place]) 08/08/2010   Qualifier: Diagnosis of  By: Lorelei Pont MD, Frederico Hamman      Past Surgical History:  Procedure Laterality Date   APPENDECTOMY  2000   CARPAL TUNNEL RELEASE     KNEE SURGERY  2011    Family History  Problem Relation Age of Onset   Healthy Mother    Healthy Father     Past Medical  History, Surgical History, Social History, Family History, Problem List, Medications, and Allergies have been reviewed and updated if relevant.  Review of Systems: Pertinent positives are listed above.  Otherwise, a full 14 point review of systems has been done in full and it is negative except where it is noted positive.  Objective:   BP 112/60   Pulse 84   Temp 97.8 F (36.6 C) (Temporal)   Ht 5\' 7"  (1.702 m)   Wt 234 lb (106.1 kg)   SpO2 96%   BMI 36.65 kg/m  Ideal Body Weight: Weight in (lb) to have BMI = 25: 159.3  Ideal Body Weight: Weight in (lb) to have BMI = 25: 159.3 No results found. Depression screen [facility] 2/9 06/27/2019 01/19/2019 08/24/2017 12/04/2016  Decreased Interest 0 0 0 0  Down, Depressed, Hopeless 0 0 0 0  PHQ - 2 Score 0 0 0 0     GEN: well developed, well nourished, no acute distress Eyes: conjunctiva and lids normal, PERRLA, EOMI ENT: TM clear, nares clear, oral exam WNL Neck: supple, no lymphadenopathy, no thyromegaly, no JVD Pulm: clear to auscultation and percussion, respiratory effort normal CV: regular rate and rhythm, S1-S2, no murmur, rub or gallop, no bruits, peripheral pulses normal and symmetric, no cyanosis, clubbing, edema or varicosities GI: soft, non-tender; no hepatosplenomegaly, masses; active bowel sounds all quadrants GU: deferred Lymph: no cervical, axillary or inguinal adenopathy MSK: gait normal, muscle tone and strength WNL, no joint swelling, effusions, discoloration, crepitus  SKIN: clear, good turgor, color WNL, no rashes, lesions, or ulcerations Neuro: normal mental status, normal strength, sensation, and motion Psych: alert; oriented to person, place and time, normally interactive and not anxious or depressed in appearance.  All labs reviewed with patient. Results for orders placed or performed in visit on 11/29/20  POCT glycosylated hemoglobin (Hb A1C)  Result Value Ref Range   Hemoglobin A1C 6.7 (A) 4.0 - 5.6 %   HbA1c POC  (<> result, manual entry)     HbA1c, POC (prediabetic range)     HbA1c, POC (controlled diabetic range)      Assessment and Plan:     ICD-10-CM   1. Healthcare maintenance  Z00.00     2. Type 2 diabetes mellitus with peripheral neuropathy (HCC)  E11.42 Microalbumin / creatinine urine ratio    Basic metabolic panel    Hemoglobin A1c    Pneumococcal conjugate vaccine 20-valent (Prevnar 20)    CANCELED: Microalbumin, urine    3. Need for influenza vaccination  Z23 Flu Vaccine QUAD 6+ mos PF IM (Fluarix Quad PF)    4. Colon cancer screening  Z12.11 Ambulatory referral to Gastroenterology    5. Hyperlipidemia with target LDL less than 70  E78.5 Lipid panel    6. Encounter for long-term current use of medication  Z79.899 CBC with Differential/Platelet    Hepatic function panel    7. Need for hepatitis C screening test  Z11.59 Hepatitis C antibody    8. Exposure to blood or body fluid  Z77.21 Hepatitis C antibody  9. Need for vaccination against Streptococcus pneumoniae  Z23 Pneumococcal conjugate vaccine 20-valent (Prevnar 20)     Update vaccinations today  Check all baseline laboratories.  Given age, refer for screening colonoscopy.  Diabetes is doing well.  Health Maintenance Exam: The patient's preventative maintenance and recommended screening tests for an annual wellness exam were reviewed in full today. Brought up to date unless services declined.  Counselled on the importance of diet, exercise, and its role in overall health and mortality. The patient's FH and SH was reviewed, including their home life, tobacco status, and drug and alcohol status.  Follow-up in 1 year for physical exam or additional follow-up below.  Follow-up: No follow-ups on file. Or follow-up in 1 year if not noted.  No orders of the defined types were placed in this encounter.  Medications Discontinued During This Encounter  Medication Reason   cefdinir (OMNICEF) 300 MG capsule Completed  Course   celecoxib (CELEBREX) 200 MG capsule Completed Course   glucose blood test strip Completed Course   Orders Placed This Encounter  Procedures   Flu Vaccine QUAD 6+ mos PF IM (Fluarix Quad PF)   Pneumococcal conjugate vaccine 20-valent (Prevnar 20)   Microalbumin / creatinine urine ratio   Basic metabolic panel   CBC with Differential/Platelet   Hepatic function panel   Lipid panel   Hemoglobin A1c   Hepatitis C antibody   Ambulatory referral to Gastroenterology    Signed,  Frederico Hamman T. Kherington Meraz, MD   Allergies as of 04/10/2021       Reactions   Codeine         Medication List        Accurate as of April 10, 2021  2:32 PM. If you have any questions, ask your nurse or doctor.          STOP taking these medications    cefdinir 300 MG capsule Commonly known as: OMNICEF Stopped by: Owens Loffler, MD   celecoxib 200 MG capsule Commonly known as: CeleBREX Stopped by: Owens Loffler, MD   glucose blood test strip Stopped by: Owens Loffler, MD       TAKE these medications    albuterol 108 (90 Base) MCG/ACT inhaler Commonly known as: VENTOLIN HFA Inhale 1-2 puffs into the lungs every 6 (six) hours as needed for wheezing or shortness of breath.   azelastine 0.1 % nasal spray Commonly known as: ASTELIN INHALE 2 SPRAYS IN EACH NOSTRIL TWICE A DAY   EPINEPHrine 0.3 mg/0.3 mL Soaj injection Commonly known as: EPI-PEN See admin instructions. for allergic reaction   eszopiclone 1 MG Tabs tablet Commonly known as: LUNESTA TAKE 1 TABLET BY MOUTH AT BEDTIME AS NEEDED FOR SLEEP. TAKE IMMEDIATELY BEFORE BEDTIME   Farxiga 10 MG Tabs tablet Generic drug: dapagliflozin propanediol TAKE 1 TABLET (10 MG) BY MOUTH DAILY BEFORE BREAKFAST. TO FOLLOW 5MG  DOSE   fluticasone 50 MCG/ACT nasal spray Commonly known as: FLONASE SPRAY 1 SPRAY INTO EACH NOSTRIL EVERY DAY   FreeStyle Libre 2 Sensor Misc CHANGE SENSOR EVERY 14 DAYS   gabapentin 400 MG  capsule Commonly known as: NEURONTIN TAKE 1 CAPSULE BY MOUTH 3 TIMES DAILY.   glipiZIDE 5 MG tablet Commonly known as: GLUCOTROL Take 1 tablet (5 mg total) by mouth daily before breakfast AND 0.5 tablets (2.5 mg total) daily before supper.   levocetirizine 5 MG tablet Commonly known as: XYZAL Take 5 mg by mouth every evening.   meloxicam 15 MG tablet Commonly known as: MOBIC TAKE 1  TABLET (15 MG TOTAL) BY MOUTH DAILY.   metFORMIN 500 MG 24 hr tablet Commonly known as: GLUCOPHAGE-XR TAKE 2 TABLETS BY MOUTH EVERY DAY WITH BREAKFAST   montelukast 10 MG tablet Commonly known as: SINGULAIR Take 1 tablet (10 mg total) by mouth daily.   olopatadine 0.1 % ophthalmic solution Commonly known as: PATANOL 1 drop 2 (two) times daily.   pioglitazone 45 MG tablet Commonly known as: ACTOS TAKE 1 TABLET BY MOUTH EVERY DAY   pravastatin 20 MG tablet Commonly known as: PRAVACHOL TAKE 1 TABLET BY MOUTH EVERY DAY   tiZANidine 4 MG tablet Commonly known as: ZANAFLEX TAKE 1 TABLET BY MOUTH AT BEDTIME AS NEEDED FOR MUSCLE SPASMS.

## 2021-04-11 LAB — HEPATITIS C ANTIBODY
Hepatitis C Ab: NONREACTIVE
SIGNAL TO CUT-OFF: 0.04 (ref <1.00)

## 2021-04-17 ENCOUNTER — Other Ambulatory Visit: Payer: Self-pay | Admitting: Family Medicine

## 2021-05-01 DIAGNOSIS — Z03818 Encounter for observation for suspected exposure to other biological agents ruled out: Secondary | ICD-10-CM | POA: Diagnosis not present

## 2021-05-01 DIAGNOSIS — Z20822 Contact with and (suspected) exposure to covid-19: Secondary | ICD-10-CM | POA: Diagnosis not present

## 2021-05-06 ENCOUNTER — Other Ambulatory Visit: Payer: Self-pay | Admitting: Internal Medicine

## 2021-05-06 ENCOUNTER — Other Ambulatory Visit: Payer: Self-pay | Admitting: Family Medicine

## 2021-05-06 NOTE — Telephone Encounter (Signed)
Last office visit 04/10/2021 for CPE.  Last refilled 09/23/2020 for #180 with 1 refill.  No future appointments with PCP.

## 2021-05-07 DIAGNOSIS — J3089 Other allergic rhinitis: Secondary | ICD-10-CM | POA: Diagnosis not present

## 2021-05-07 DIAGNOSIS — H1045 Other chronic allergic conjunctivitis: Secondary | ICD-10-CM | POA: Diagnosis not present

## 2021-05-07 DIAGNOSIS — J301 Allergic rhinitis due to pollen: Secondary | ICD-10-CM | POA: Diagnosis not present

## 2021-05-07 DIAGNOSIS — J4520 Mild intermittent asthma, uncomplicated: Secondary | ICD-10-CM | POA: Diagnosis not present

## 2021-05-16 ENCOUNTER — Ambulatory Visit: Payer: BC Managed Care – PPO | Admitting: Internal Medicine

## 2021-05-16 ENCOUNTER — Encounter: Payer: Self-pay | Admitting: Internal Medicine

## 2021-05-16 ENCOUNTER — Other Ambulatory Visit: Payer: Self-pay

## 2021-05-16 VITALS — BP 122/76 | HR 91 | Ht 67.0 in | Wt 235.6 lb

## 2021-05-16 DIAGNOSIS — E1142 Type 2 diabetes mellitus with diabetic polyneuropathy: Secondary | ICD-10-CM | POA: Diagnosis not present

## 2021-05-16 MED ORDER — OZEMPIC (0.25 OR 0.5 MG/DOSE) 2 MG/1.5ML ~~LOC~~ SOPN: 0.5000 mg | PEN_INJECTOR | SUBCUTANEOUS | 1 refills | Status: DC

## 2021-05-16 NOTE — Progress Notes (Signed)
Name: Lonnie Jones  Age/ Sex: 45 y.o., male   MRN/ DOB: [id number], Oct 07, 1975     PCP: Owens Loffler, MD   Reason for Endocrinology Evaluation: Type 2 Diabetes Mellitus  Initial Endocrine Consultative Visit: 05/25/2019    PATIENT IDENTIFIER: Lonnie Jones is a 45 y.o. male with a past medical history of T2DM, Asthma,and  Dyslipidemia. The patient has followed with Endocrinology clinic since 05/25/2019 for consultative assistance with management of his diabetes.  DIABETIC HISTORY:  Lonnie Jones was diagnosed with T2DM in 2016, he has been on oral glycemic agents since his diagnosis. No prior use of insulin. His hemoglobin A1c has ranged from  6.9% in 2019, peaking at 8.5% in 07/2017.  On his initial visit to our clinic his A1c was 7.5%. He was on metformin, glipizide and pioglitazone. We reduced Metformin due to GI side effects and started farxiga.   Works in sewer and drain cleaning  SUBJECTIVE:   During the last visit (11/29/2020): A1c 6.6%. We continued  Glipizide, pioglitazone , metformin and  farxiga.     Today (05/16/2021): Lonnie Jones is here for a follow up on diabetes management.  He checks his blood sugars a few times a day through the CGM .The patient has had hypoglycemic episodes since the last clinic visit.    Was seen by podiatry 12/04/2020 for plantar fasciitis , received intra-articular injection and PT .   He continues with stomach pain that he atrribites    HOME DIABETES REGIMEN:  Metformin 500 mg XR, 2 tabs daily  Farxiga 10 mg daily  Glipizide 5 mg, 1 tab before breakfast and half before supper  Pioglitazone 45 mg daily      Statin: Yes ACE-I/ARB: Yes   CONTINUOUS GLUCOSE MONITORING RECORD INTERPRETATION    Dates of Recording: 12/2-12/15/2022  Sensor description: freestyle libre  Results statistics:   CGM use % of time 63  Average and SD 162/26.4  Time in range  73    %  % Time Above 180 22  % Time above 250 5  % Time Below target  0       Glycemic patterns summary: hyperglycemia noted mainly after supper , trends down over night Optimal during the day   Hyperglycemic episodes  postprandial  Hypoglycemic episodes occurred fasting  Overnight periods: trends down       DIABETIC COMPLICATIONS: Microvascular complications:  Neuropathy Denies: CKD, retinopathy  Last eye exam: Completed 05/2021   Macrovascular complications:  Denies: CAD, PVD, CVA   HISTORY:  Past Medical History:  Past Medical History:  Diagnosis Date   Acute idiopathic gout involving toe 06/22/2017   Allergic rhinitis due to allergen 08/25/2011   Asthma    GERD (gastroesophageal reflux disease)    Hyperlipidemia LDL goal < 70 12/16/2010   Migraine headache    Type 2 diabetes mellitus with peripheral neuropathy ([place]) 08/08/2010   Qualifier: Diagnosis of  By: Lorelei Pont MD, Frederico Hamman     Past Surgical History:  Past Surgical History:  Procedure Laterality Date   APPENDECTOMY  2000   CARPAL TUNNEL RELEASE     KNEE SURGERY  2011   Social History:  reports that he has quit smoking. His smokeless tobacco use includes snuff. He reports that he does not drink alcohol and does not use drugs. Family History:  Family History  Problem Relation Age of Onset   Healthy Mother    Healthy Father      HOME MEDICATIONS: Allergies as of 05/16/2021  Reactions   Codeine         Medication List        Accurate as of May 16, 2021  9:05 AM. If you have any questions, ask your nurse or doctor.          albuterol 108 (90 Base) MCG/ACT inhaler Commonly known as: VENTOLIN HFA Inhale 1-2 puffs into the lungs every 6 (six) hours as needed for wheezing or shortness of breath.   azelastine 0.1 % nasal spray Commonly known as: ASTELIN INHALE 2 SPRAYS IN EACH NOSTRIL TWICE A DAY   EPINEPHrine 0.3 mg/0.3 mL Soaj injection Commonly known as: EPI-PEN See admin instructions. for allergic reaction   eszopiclone 1 MG Tabs  tablet Commonly known as: LUNESTA TAKE 1 TABLET BY MOUTH AT BEDTIME AS NEEDED FOR SLEEP. TAKE IMMEDIATELY BEFORE BEDTIME   Farxiga 10 MG Tabs tablet Generic drug: dapagliflozin propanediol TAKE 1 TABLET (10 MG) BY MOUTH DAILY BEFORE BREAKFAST. TO FOLLOW 5MG  DOSE   fluticasone 50 MCG/ACT nasal spray Commonly known as: FLONASE SPRAY 1 SPRAY INTO EACH NOSTRIL EVERY DAY   FreeStyle Libre 2 Sensor Misc CHANGE SENSOR EVERY 14 DAYS   gabapentin 400 MG capsule Commonly known as: NEURONTIN TAKE 1 CAPSULE BY MOUTH 2 TIMES DAILY.   glipiZIDE 5 MG tablet Commonly known as: GLUCOTROL Take 1 tablet (5 mg total) by mouth daily before breakfast AND 0.5 tablets (2.5 mg total) daily before supper.   levocetirizine 5 MG tablet Commonly known as: XYZAL Take 5 mg by mouth every evening.   meloxicam 15 MG tablet Commonly known as: MOBIC TAKE 1 TABLET (15 MG TOTAL) BY MOUTH DAILY.   metFORMIN 500 MG 24 hr tablet Commonly known as: GLUCOPHAGE-XR TAKE 2 TABLETS BY MOUTH EVERY DAY WITH BREAKFAST   montelukast 10 MG tablet Commonly known as: SINGULAIR Take 1 tablet (10 mg total) by mouth daily.   olopatadine 0.1 % ophthalmic solution Commonly known as: PATANOL 1 drop 2 (two) times daily.   pioglitazone 45 MG tablet Commonly known as: ACTOS TAKE 1 TABLET BY MOUTH EVERY DAY   pravastatin 20 MG tablet Commonly known as: PRAVACHOL TAKE 1 TABLET BY MOUTH EVERY DAY   tiZANidine 4 MG tablet Commonly known as: ZANAFLEX TAKE 1 TABLET BY MOUTH AT BEDTIME AS NEEDED FOR MUSCLE SPASMS.         OBJECTIVE:   Vital Signs: BP 122/76 (BP Location: Left Arm, Patient Position: Sitting, Cuff Size: Normal)    Pulse 91    Ht 5\' 7"  (1.702 m)    Wt 235 lb 9.6 oz (106.9 kg)    SpO2 98%    BMI 36.90 kg/m   Wt Readings from Last 3 Encounters:  05/16/21 235 lb 9.6 oz (106.9 kg)  04/10/21 234 lb (106.1 kg)  11/29/20 231 lb (104.8 kg)     Exam: General: Pt appears well and is in NAD  Lungs: Clear with  good BS bilat with no rales, rhonchi, or wheezes  Heart: RRR with normal S1 and S2 and no gallops; no murmurs; no rub  Abdomen: Normoactive bowel sounds, soft, nontender, without masses or organomegaly palpable  Extremities: No pretibial edema.   Neuro: MS is good with appropriate affect, pt is alert and Ox3    DM foot exam: 05/16/2021 The skin of the feet is intact without sores or ulcerations. The pedal pulses are 1+ on right and 1+ on left. The sensation is intact to a screening 5.07, 10 gram monofilament bilaterally  DATA REVIEWED:  Lab Results  Component Value Date   HGBA1C 7.1 (H) 04/10/2021   HGBA1C 6.7 (A) 11/29/2020   HGBA1C 6.8 (A) 07/26/2020   Lab Results  Component Value Date   MICROALBUR 1.1 04/10/2021   LDLCALC 69 04/02/2020   CREATININE 0.86 04/10/2021   Lab Results  Component Value Date   MICRALBCREAT 2.6 04/10/2021     Lab Results  Component Value Date   CHOL 169 04/10/2021   HDL 41.40 04/10/2021   LDLCALC 69 04/02/2020   LDLDIRECT 99.0 04/10/2021   TRIG 256.0 (H) 04/10/2021   CHOLHDL 4 04/10/2021         ASSESSMENT / PLAN / RECOMMENDATIONS:   1) Type 2 Diabetes Mellitus, Optimally controlled, With Neuropathic complications - Most recent A1c of 7.1 %. Goal A1c < 7.0 %.    -His A1c has trended up, patient admits to dietary indiscretion -He continues to have GI side effects to small dose of metformin, will discontinue -He is interested in being on Ozempic as his friend is on it with favorable results.  I have cautioned him against GI side effects, he was trained on proper pen use in the office today -In review of his CGM download the patient has been noted with hyperglycemia in the late evening, typically at supper or after supper -We will make the following changes   MEDICATIONS: -Stop metformin -Start Ozempic 0.25 mg weekly for 6 weeks, then increase to 0.5 mg weekly -Continue glipizide 5 mg,  1 tablet before Breakfast and may take half a  tablet before supper  - Continue Farxiga 10 mg tablets - Continue Pioglitazone 45 mg daily   EDUCATION / INSTRUCTIONS: BG monitoring instructions: Patient is instructed to check his blood sugars 1 times a day. Call [facility] if: BG persistently < 70  I reviewed the Rule of 15 for the treatment of hypoglycemia in detail with the patient. Literature supplied.    F/U in 4 months   Signed electronically by: Mack Guise, MD  [facility]  [facility] [address] [place], [place] [address] Phone: [phone] FAX: [phone]   CC: Owens Loffler, [place] [address] Phone: [phone]  Fax: [phone]  Return to Endocrinology clinic as below: No future appointments.

## 2021-05-16 NOTE — Patient Instructions (Signed)
-   Stop Metformin  - Start Ozempic 0.25 mg weekly for 6 weeks , then increase to 0.5 mg weekly  - Continue  Glipizide 5 mg  1 tablet before Breakfast , use half a tablet before supper if you are having a bigger meal then usual - Continue Farxiga 10 mg tablets - Continue Pioglitazone 45 mg daily         HOW TO TREAT LOW BLOOD SUGARS (Blood sugar LESS THAN 70 MG/DL) Please follow the RULE OF 15 for the treatment of hypoglycemia treatment (when your (blood sugars are less than 70 mg/dL)   STEP 1: Take 15 grams of carbohydrates when your blood sugar is low, which includes:  3-4 GLUCOSE TABS  OR 3-4 OZ OF JUICE OR REGULAR SODA OR ONE TUBE OF GLUCOSE GEL    STEP 2: RECHECK blood sugar in 15 MINUTES STEP 3: If your blood sugar is still low at the 15 minute recheck --> then, go back to STEP 1 and treat AGAIN with another 15 grams of carbohydrates.

## 2021-06-06 ENCOUNTER — Other Ambulatory Visit: Payer: Self-pay | Admitting: Family Medicine

## 2021-06-06 NOTE — Telephone Encounter (Signed)
Last office visit 04/10/2021 for CPE.  Last refilled 01/24/2021 for #30 with 3 refills.  No future appointments with PCP.

## 2021-06-07 ENCOUNTER — Other Ambulatory Visit: Payer: Self-pay | Admitting: Family Medicine

## 2021-06-07 MED ORDER — ESZOPICLONE 1 MG PO TABS: ORAL_TABLET | ORAL | 3 refills | Status: DC

## 2021-06-07 NOTE — Telephone Encounter (Signed)
Mr. Cerny notified by telephone that Dr. Lorelei Pont resent his Johnnye Sima to [facility].

## 2021-06-07 NOTE — Telephone Encounter (Signed)
Pt called stating that [facility] didn't have medication eszopiclone (LUNESTA) 1 MG TABS tablet. Pt is asking if you could seen it to [place], [address]. Pt is asking can you now start sending all of his medication to [facility]. Please advise.

## 2021-06-10 ENCOUNTER — Telehealth: Payer: Self-pay

## 2021-06-10 ENCOUNTER — Other Ambulatory Visit (HOSPITAL_COMMUNITY): Payer: Self-pay

## 2021-06-10 NOTE — Telephone Encounter (Signed)
Patient Advocate Encounter   Received notification from [facility] that prior authorization for Ozempic is required by his/her insurance [employer].   PA submitted on 06/10/21  Key#: [id number]  Status is pending    [facility] will continue to follow:  Patient Advocate Fax: [phone]

## 2021-06-12 ENCOUNTER — Other Ambulatory Visit (HOSPITAL_COMMUNITY): Payer: Self-pay

## 2021-06-12 NOTE — Telephone Encounter (Signed)
Patient Advocate Encounter  Prior Authorization for Ozempic 2mg /1.3ml pen injectors has been approved.    PA# [id number]  Effective dates: 06/10/21 through 06/09/22  Per Test Claim Patients co-pay is $558.59   Spoke with Pharmacy to Process.  Patient Advocate Fax: [phone]

## 2021-06-15 ENCOUNTER — Encounter: Payer: Self-pay | Admitting: Internal Medicine

## 2021-06-18 NOTE — Telephone Encounter (Signed)
Patient notified that a sample will be placed in fridge for him. Amieya will label the  Ozempic sample and place in fridge.

## 2021-06-18 NOTE — Telephone Encounter (Signed)
Patient is requesting sample of the below medication because pharmacy is unable to provide at this time.  Semaglutide,0.25 or 0.5MG /DOS, (OZEMPIC, 0.25 OR 0.5 MG/DOSE,) 2 MG/1.5ML SOPN  Please let the patient know via My Chart if sample is available.

## 2021-06-19 NOTE — Telephone Encounter (Signed)
Patient picked up 1 box of Ozempic

## 2021-07-01 IMAGING — MR MRI OF THE LEFT SHOULDER WITHOUT CONTRAST
4 of 5 series · 19 of 40 positions shown · non-contrast
Comparison: None.

CLINICAL DATA: Chronic left shoulder pain and decreased range of
motion.

EXAM:
MRI OF THE LEFT SHOULDER WITHOUT CONTRAST
TECHNIQUE: Multiplanar, multisequence MR imaging of the shoulder was performed.
No intravenous contrast was administered.

[Series 6: PD fat-sat · axial · left · 4.0mm · 0.44mm/px · z∈[-43,+45]mm · 6 of 20 slices shown (1 of 2)]
[im 1/20]
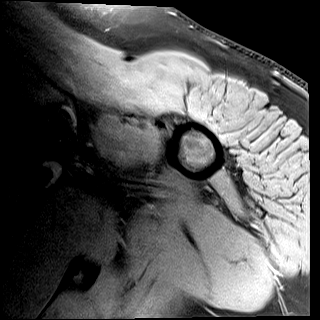
[im 4/20]
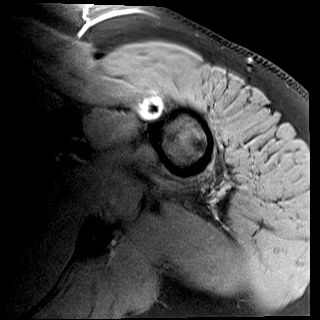
[im 8/20]
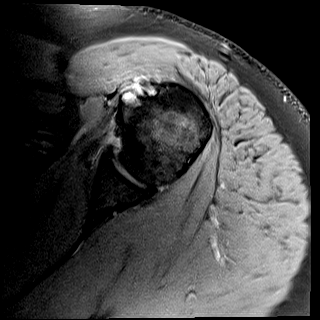
[im 12/20]
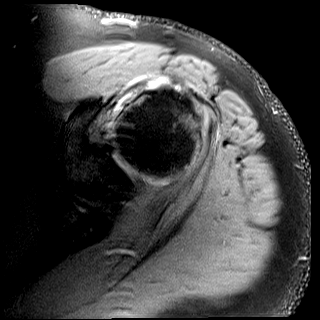
[im 16/20]
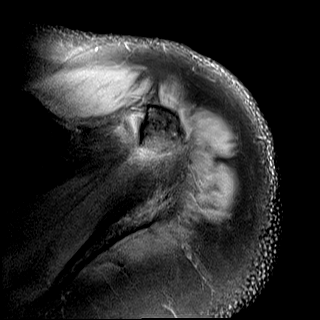
[im 20/20]
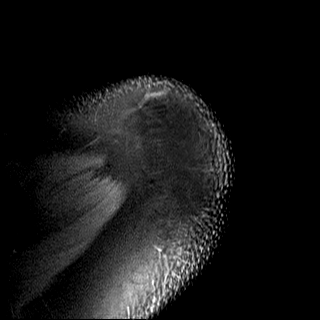

[Series 7: T2 fat-sat · oblique · left · 4.0mm · 0.22mm/px · 3 of 21 slices shown (1 of 2)]
[im 3/21]
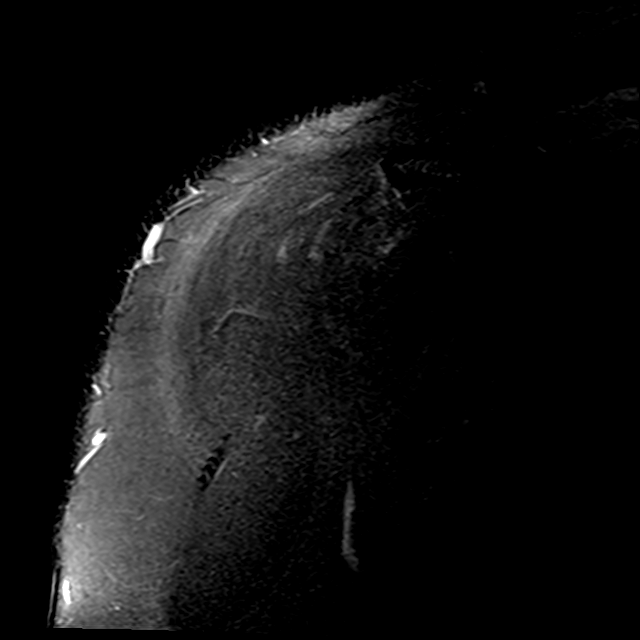
[im 12/21]
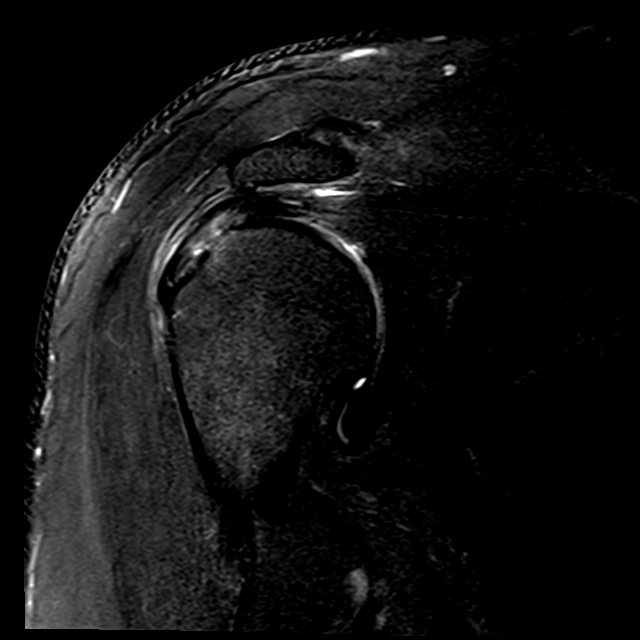
[im 18/21]
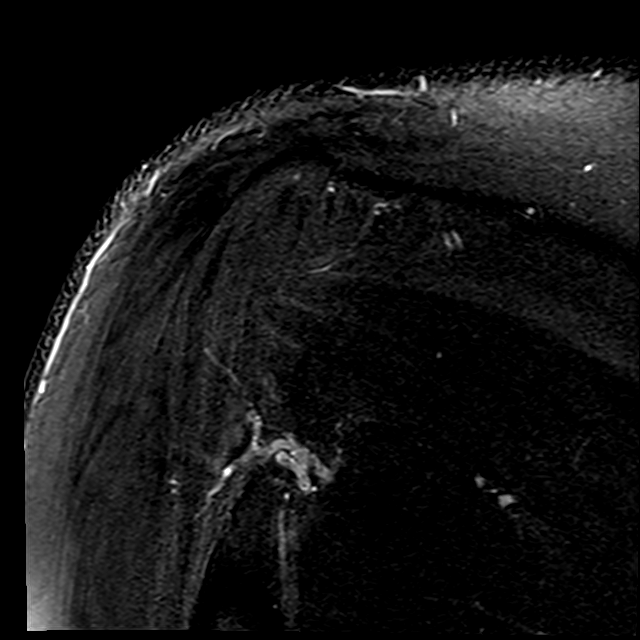

[Series 8: PD fat-sat · oblique · left · 4.0mm · 0.22mm/px · 7 of 21 slices shown (2 of 2)]
[im 1/21]
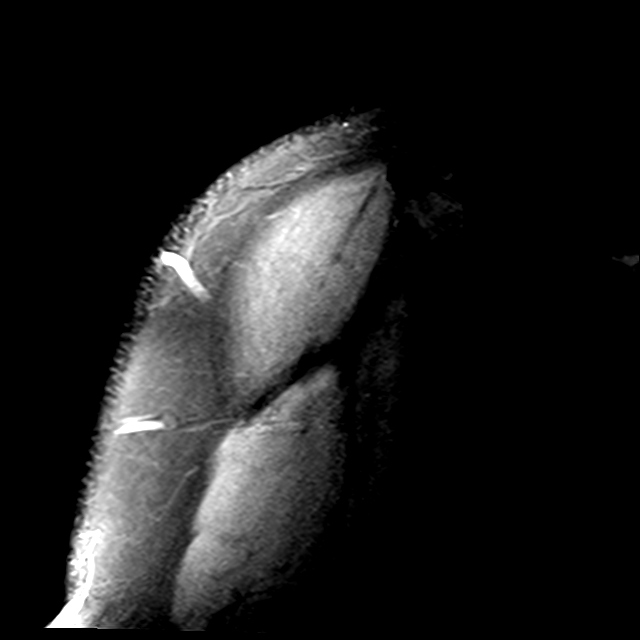
[im 3/21]
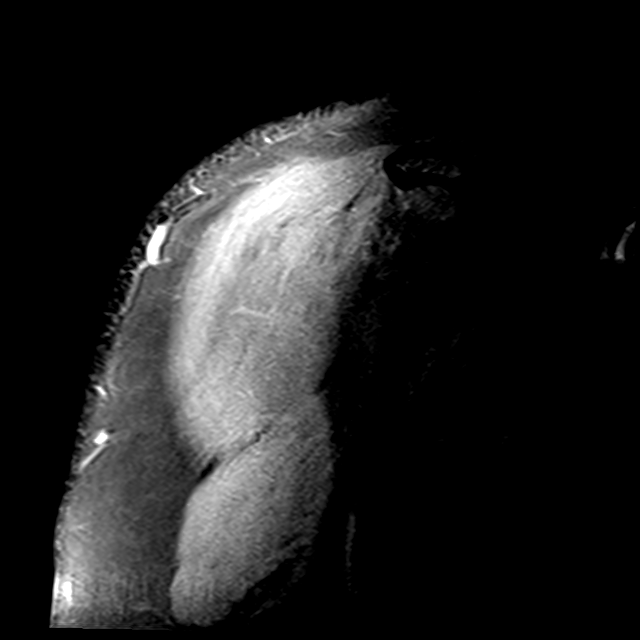
[im 6/21]
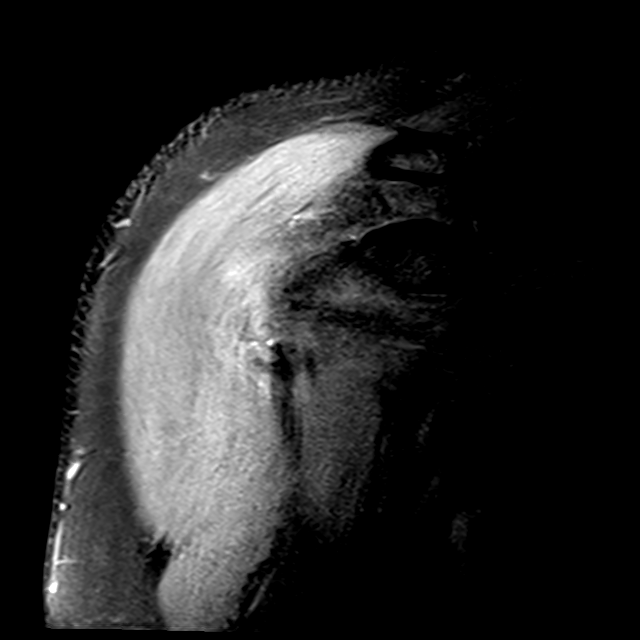
[im 9/21]
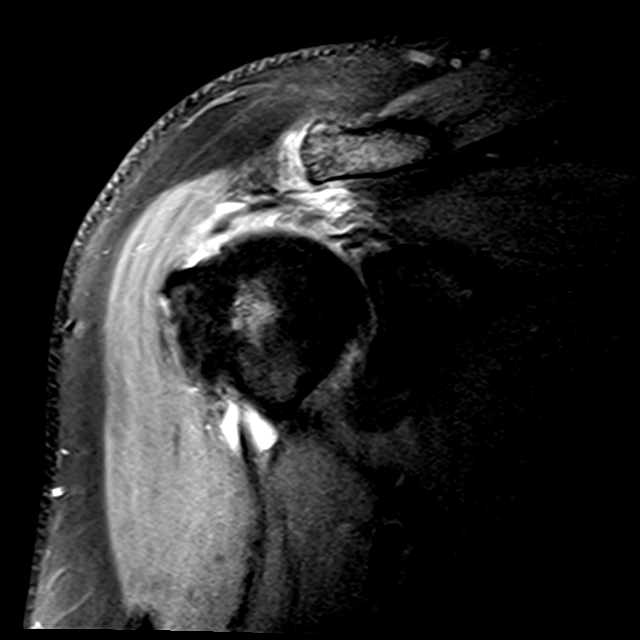
[im 12/21]
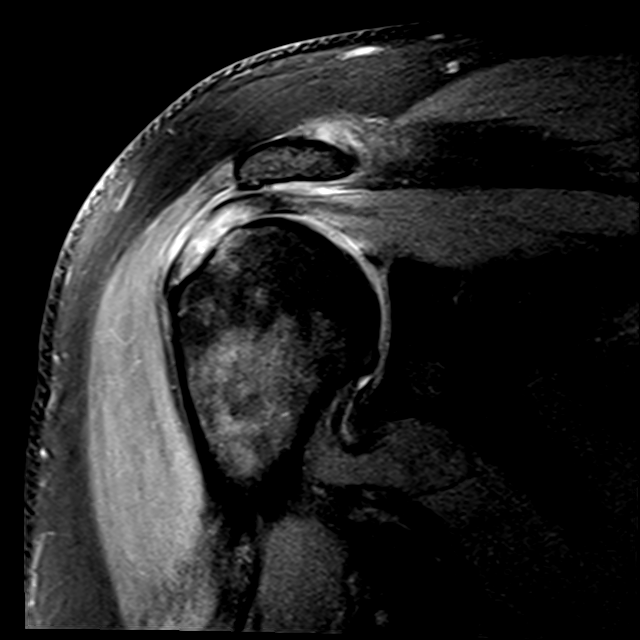
[im 15/21]
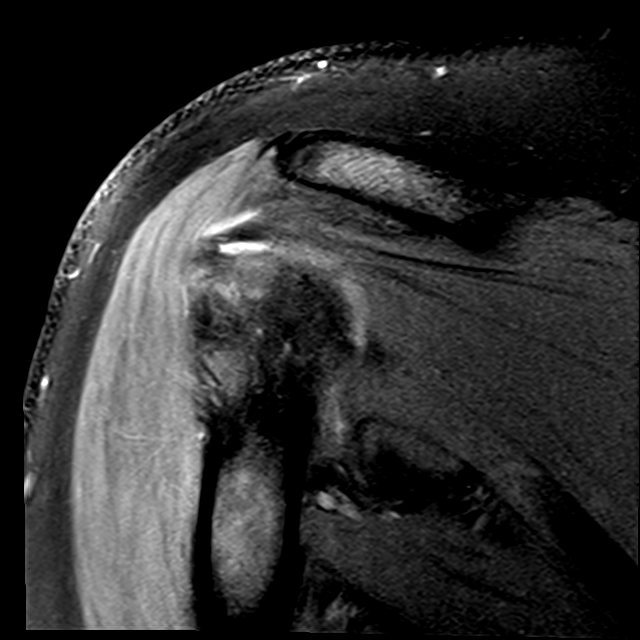
[im 18/21]
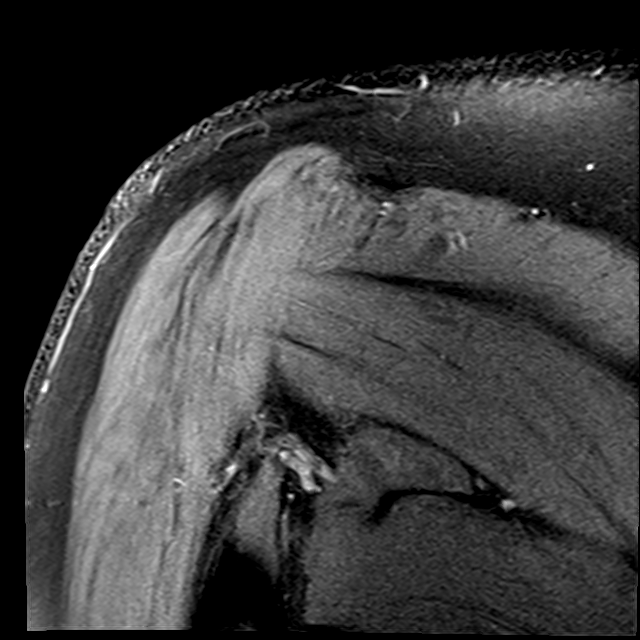

[Series 9: T2 fat-sat · oblique · left · 4.0mm · 0.44mm/px · 3 of 23 slices shown (2 of 2)]
[im 3/23]
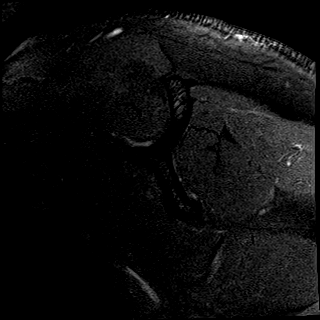
[im 12/23]
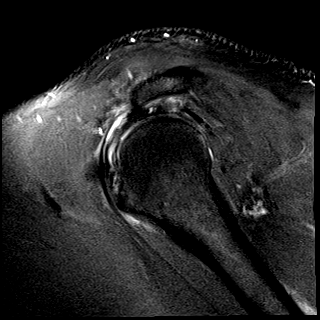
[im 20/23]
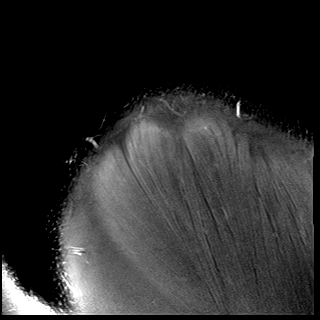

[19 of 40 positions shown; findings below may reference images not displayed]

FINDINGS: Rotator cuff: Moderate tendinosis of the supraspinatus tendon with a
high-grade partial-thickness articular surface tear. Moderate
tendinosis of the infraspinatus tendon with a tiny interstitial tear
of the musculotendinous junction. Teres minor tendon is intact.
Partial-thickness tear of the subscapularis tendon.

Muscles: No atrophy or fatty replacement of nor abnormal signal
within, the muscles of the rotator cuff.

Biceps long head: Mild tendinosis of the intra-articular portion of
the long head of the biceps tendon. Small partial tear of the
proximal extra-articular portion of the long head of the biceps
tendon.

Acromioclavicular Joint: Normal acromioclavicular joint. Type I
acromion. Trace subacromial/subdeltoid bursal fluid.

Glenohumeral Joint: No joint effusion. No chondral defect.

Labrum: Grossly intact, but evaluation is limited by lack of
intraarticular fluid.

Bones: No acute osseous abnormality. No aggressive osseous lesion.
Subcortical reactive marrow edema and cystic changes in the lesser
tuberosity with relative narrowing of the coracohumeral distance.

Other: No fluid collection or hematoma.
IMPRESSION: 1. Moderate tendinosis of the supraspinatus tendon with a high-grade
partial-thickness articular surface tear.
2. Moderate tendinosis of the infraspinatus tendon with a tiny
interstitial tear of the musculotendinous junction. Subcortical
reactive marrow edema and cystic changes in the lesser tuberosity
with relative narrowing of the coracohumeral distance. This
constellation of findings can be seen with subcoracoid impingement.
3. Partial-thickness tear of the subscapularis tendon.

## 2021-07-03 ENCOUNTER — Encounter: Payer: Self-pay | Admitting: Emergency Medicine

## 2021-07-03 ENCOUNTER — Ambulatory Visit
Admission: EM | Admit: 2021-07-03 | Discharge: 2021-07-03 | Disposition: A | Discharge status: Home / Self Care | Payer: BC Managed Care – PPO | Attending: Emergency Medicine | Admitting: Emergency Medicine

## 2021-07-03 ENCOUNTER — Other Ambulatory Visit: Payer: Self-pay

## 2021-07-03 DIAGNOSIS — B349 Viral infection, unspecified: Secondary | ICD-10-CM

## 2021-07-03 LAB — POCT RAPID STREP A (OFFICE): Rapid Strep A Screen: NEGATIVE

## 2021-07-03 NOTE — ED Triage Notes (Signed)
Patient in office today c/o Chills,sorethroat and right rear pain since yesterday.  Some nasal drainage  OTC: none Denies: N/V and diarrhea

## 2021-07-03 NOTE — ED Provider Notes (Addendum)
Lonnie Jones    CSN: [id number] Arrival date & time: 07/03/21  0919      History   Chief Complaint Chief Complaint  Patient presents with   Sore Throat   Otalgia    HPI Lonnie Jones is a 46 y.o. male.  Patient presents with 2-day history of chills, sore throat, right earache, mild nonproductive cough.  No fever, rash, chest pain, shortness of breath, vomiting, diarrhea, or other symptoms.  No treatments attempted at home.  His medical history includes asthma, diabetes, peripheral neuropathy, migraine headaches, GERD.  The history is provided by the patient and medical records.   Past Medical History:  Diagnosis Date   Acute idiopathic gout involving toe 06/22/2017   Allergic rhinitis due to allergen 08/25/2011   Asthma    GERD (gastroesophageal reflux disease)    Hyperlipidemia LDL goal < 70 12/16/2010   Migraine headache    Type 2 diabetes mellitus with peripheral neuropathy ([place]) 08/08/2010   Qualifier: Diagnosis of  By: Lorelei Pont MD, [place]      Patient Active Problem List   Diagnosis Date Noted   Peripheral autonomic neuropathy due to diabetes mellitus ([place]) 02/16/2015   Allergic rhinitis due to allergen 08/25/2011   Hyperlipidemia with target LDL less than 70 12/16/2010   Type 2 diabetes mellitus with peripheral neuropathy ([place]) 08/08/2010   MIGRAINE HEADACHE 08/08/2010   ASTHMA 08/08/2010    Past Surgical History:  Procedure Laterality Date   APPENDECTOMY  2000   CARPAL TUNNEL RELEASE     KNEE SURGERY  2011       Home Medications    Prior to Admission medications   Medication Sig Start Date End Date Taking? Authorizing Provider  albuterol (VENTOLIN HFA) 108 (90 Base) MCG/ACT inhaler Inhale 1-2 puffs into the lungs every 6 (six) hours as needed for wheezing or shortness of breath. 06/04/20   Vanessa Kick, MD  azelastine (ASTELIN) 0.1 % nasal spray INHALE 2 SPRAYS IN EACH NOSTRIL TWICE A DAY 11/12/18   [provider]  Continuous Blood Gluc  Sensor (FREESTYLE LIBRE 2 SENSOR) MISC CHANGE SENSOR EVERY 14 DAYS 01/15/21   Shamleffer, Melanie Crazier, MD  EPINEPHrine 0.3 mg/0.3 mL IJ SOAJ injection See admin instructions. for allergic reaction 02/16/18   [provider]  eszopiclone (LUNESTA) 1 MG TABS tablet TAKE 1 TABLET BY MOUTH AT BEDTIME AS NEEDED FOR SLEEP. TAKE IMMEDIATELY BEFORE BEDTIME 06/07/21   Copland, Frederico Hamman, MD  FARXIGA 10 MG TABS tablet TAKE 1 TABLET (10 MG) BY MOUTH DAILY BEFORE BREAKFAST. TO FOLLOW 5MG  DOSE 05/06/21   Shamleffer, Melanie Crazier, MD  fluticasone (FLONASE) 50 MCG/ACT nasal spray SPRAY 1 SPRAY INTO EACH NOSTRIL EVERY DAY 12/09/18   [provider]  gabapentin (NEURONTIN) 400 MG capsule TAKE 1 CAPSULE BY MOUTH 2 TIMES DAILY. 05/06/21   Copland, Frederico Hamman, MD  glipiZIDE (GLUCOTROL) 5 MG tablet Take 1 tablet (5 mg total) by mouth daily before breakfast AND 0.5 tablets (2.5 mg total) daily before supper. 07/26/20   Shamleffer, Melanie Crazier, MD  levocetirizine (XYZAL) 5 MG tablet Take 5 mg by mouth every evening.    [provider]  meloxicam (MOBIC) 15 MG tablet TAKE 1 TABLET (15 MG TOTAL) BY MOUTH DAILY. 02/07/21   Copland, Frederico Hamman, MD  metFORMIN (GLUCOPHAGE-XR) 500 MG 24 hr tablet TAKE 2 TABLETS BY MOUTH EVERY DAY WITH BREAKFAST 05/06/21   Shamleffer, Melanie Crazier, MD  montelukast (SINGULAIR) 10 MG tablet Take 1 tablet (10 mg total) by mouth daily.  04/02/20   Copland, Frederico Hamman, MD  olopatadine (PATANOL) 0.1 % ophthalmic solution 1 drop 2 (two) times daily. 09/23/20   [provider]  pioglitazone (ACTOS) 45 MG tablet TAKE 1 TABLET BY MOUTH EVERY DAY 05/06/21   Shamleffer, Melanie Crazier, MD  pravastatin (PRAVACHOL) 20 MG tablet TAKE 1 TABLET BY MOUTH EVERY DAY 09/23/20   Copland, Frederico Hamman, MD  Semaglutide,0.25 or 0.5MG /DOS, (OZEMPIC, 0.25 OR 0.5 MG/DOSE,) 2 MG/1.5ML SOPN Inject 0.5 mg into the skin once a week. 05/16/21   Shamleffer, Melanie Crazier, MD  tiZANidine (ZANAFLEX) 4 MG tablet  TAKE 1 TABLET BY MOUTH AT BEDTIME AS NEEDED FOR MUSCLE SPASMS. 01/24/21   CoplandFrederico Hamman, MD    Family History Family History  Problem Relation Age of Onset   Healthy Mother    Healthy Father     Social History Social History   Tobacco Use   Smoking status: Former   Smokeless tobacco: Current    Types: Snuff  Substance Use Topics   Alcohol use: No    Alcohol/week: 0.0 standard drinks   Drug use: No     Allergies   Codeine   Review of Systems Review of Systems  Constitutional:  Positive for chills. Negative for fever.  HENT:  Positive for ear pain and sore throat.   Respiratory:  Positive for cough. Negative for shortness of breath.   Cardiovascular:  Negative for chest pain and palpitations.  Gastrointestinal:  Negative for abdominal pain, diarrhea and vomiting.  Skin:  Negative for color change and rash.  All other systems reviewed and are negative.   Physical Exam Triage Vital Signs ED Triage Vitals [07/03/21 0953]  Enc Vitals Group     BP      Pulse      Resp      Temp      Temp src      SpO2      Weight      Height      Head Circumference      Peak Flow      Pain Score 8     Pain Loc      Pain Edu?      Excl. in [place]?    No data found.  Updated Vital Signs BP 110/63 (BP Location: Left Arm)    Pulse 93    Temp 99.5 F (37.5 C)    Resp 18    SpO2 96%   Visual Acuity Right Eye Distance:   Left Eye Distance:   Bilateral Distance:    Right Eye Near:   Left Eye Near:    Bilateral Near:     Physical Exam Vitals and nursing note reviewed.  Constitutional:      General: He is not in acute distress.    Appearance: He is well-developed.  HENT:     Right Ear: Tympanic membrane normal.     Left Ear: Tympanic membrane normal.     Nose: Nose normal.     Mouth/Throat:     Mouth: Mucous membranes are moist.     Pharynx: Posterior oropharyngeal erythema present.  Cardiovascular:     Rate and Rhythm: Normal rate and regular rhythm.     Heart  sounds: Normal heart sounds.  Pulmonary:     Effort: Pulmonary effort is normal. No respiratory distress.     Breath sounds: Normal breath sounds.  Musculoskeletal:     Cervical back: Neck supple.  Skin:    General: Skin is warm and dry.  Neurological:     Mental Status: He is alert.  Psychiatric:        Mood and Affect: Mood normal.        Behavior: Behavior normal.     UC Treatments / Results  Labs (all labs ordered are listed, but only abnormal results are displayed) Labs Reviewed  COVID-19, FLU A+B NAA  POCT RAPID STREP A (OFFICE)    EKG   Radiology No results found.  Procedures Procedures (including critical care time)  Medications Ordered in UC Medications - No data to display  Initial Impression / Assessment and Plan / UC Course  I have reviewed the triage vital signs and the nursing notes.  Pertinent labs & imaging results that were available during my care of the patient were reviewed by me and considered in my medical decision making (see chart for details).    Viral illness.  Rapid strep negative. COVID and Flu pending.  Instructed patient to self quarantine per CDC guidelines.  Discussed symptomatic treatment including Tylenol or ibuprofen, rest, hydration.  Instructed patient to follow up with PCP if symptoms are not improving.  Patient agrees to plan of care.   Final Clinical Impressions(s) / UC Diagnoses   Final diagnoses:  Viral illness     Discharge Instructions      Your strep test is negative.  Your COVID and Flu tests are pending.  You should self quarantine until the test results are back.    Take Tylenol or ibuprofen as needed for fever or discomfort.  Rest and keep yourself hydrated.    Follow-up with your primary care provider if your symptoms are not improving.         ED Prescriptions   None    PDMP not reviewed this encounter.   Sharion Balloon, NP 07/03/21 1006    Sharion Balloon, NP 07/03/21 1007

## 2021-07-03 NOTE — Discharge Instructions (Addendum)
Your strep test is negative.  Your COVID and Flu tests are pending.  You should self quarantine until the test results are back.    Take Tylenol or ibuprofen as needed for fever or discomfort.  Rest and keep yourself hydrated.    Follow-up with your primary care provider if your symptoms are not improving.

## 2021-07-04 ENCOUNTER — Encounter: Payer: Self-pay | Admitting: Family Medicine

## 2021-07-04 LAB — COVID-19, FLU A+B NAA
Influenza A, NAA: NOT DETECTED
Influenza B, NAA: NOT DETECTED
SARS-CoV-2, NAA: DETECTED — AB

## 2021-07-05 MED ORDER — MOLNUPIRAVIR EUA 200MG CAPSULE: 4.0000 | ORAL_CAPSULE | Freq: Two times a day (BID) | ORAL | 0 refills | Status: AC

## 2021-07-05 MED ORDER — BENZONATATE 200 MG PO CAPS: 200.0000 mg | ORAL_CAPSULE | Freq: Two times a day (BID) | ORAL | 0 refills | Status: DC | PRN

## 2021-07-05 MED ORDER — PREDNISONE 20 MG PO TABS: ORAL_TABLET | ORAL | 0 refills | Status: DC

## 2021-07-05 NOTE — Telephone Encounter (Signed)
Pt notified of Dr Arley Phenix my chart note and pt voiced understanding and appreciative. Thank you for the teams note Dr Diona Browner. FYI to Dr Diona Browner so she is aware pt notified and voiced understanding of Dr Arley Phenix instructions.

## 2021-07-05 NOTE — Telephone Encounter (Signed)
I spoke with pt;pt said he used his inhaler and that helped the wheezing.pt said he did get SOB walking out to his truck this morning to get the inhaler but since resting no SOB. So pt has noticed SOB today upon exertion.pt has not taken temp this morning but is not having any chills or body aches today; pt does have prod cough now with clear phlegm. Pt was seen at [facility] on 07/03/21 and tested + for covid at [facility]. Flu was neg. No V & D and no lose of taste or smell. Pt had a significant H/A on 07/02/21 but after taking tylenol no H/A. Pt started with symptoms on 07/01/21. Pt requesting covid med to [facility] on [address]. Self quarantine, drink plenty of fluids, rest, and take Tylenol for fever. UC & ED precautions given and pt voiced understanding. Pt said he is in no distress with breathing at this time. Sending note to Dr Dionicio Stall [facility] and teams to [facility] who is working with Dr Diona Browner today.

## 2021-07-05 NOTE — Telephone Encounter (Signed)
I appreciate Dr. B taking care of this.  He is a really nice patient.

## 2021-07-05 NOTE — Telephone Encounter (Signed)
Please call this patient to triage. Find date of onset of illness. Thanks!

## 2021-07-24 NOTE — Telephone Encounter (Signed)
Pts spouse came in and picked up sample of Ozempic.

## 2021-09-19 ENCOUNTER — Ambulatory Visit: Payer: BC Managed Care – PPO | Admitting: Internal Medicine

## 2021-09-19 ENCOUNTER — Encounter: Payer: Self-pay | Admitting: Internal Medicine

## 2021-09-19 ENCOUNTER — Other Ambulatory Visit: Payer: Self-pay

## 2021-09-19 VITALS — BP 124/76 | HR 80 | Ht 67.0 in | Wt 222.3 lb

## 2021-09-19 DIAGNOSIS — R739 Hyperglycemia, unspecified: Secondary | ICD-10-CM

## 2021-09-19 DIAGNOSIS — E1142 Type 2 diabetes mellitus with diabetic polyneuropathy: Secondary | ICD-10-CM

## 2021-09-19 LAB — POCT GLYCOSYLATED HEMOGLOBIN (HGB A1C): Hemoglobin A1C: 6.6 % — AB (ref 4.0–5.6)

## 2021-09-19 MED ORDER — OZEMPIC (1 MG/DOSE) 4 MG/3ML ~~LOC~~ SOPN: 1.0000 mg | PEN_INJECTOR | SUBCUTANEOUS | 3 refills | Status: DC

## 2021-09-19 MED ORDER — DEXCOM G7 SENSOR MISC: 1.0000 | 3 refills | Status: DC

## 2021-09-19 NOTE — Progress Notes (Signed)
? ?Name: Lonnie Jones  ?Age/ Sex: 46 y.o., male   ?MRN/ DOB: [id number], September 28, 1975    ? ?PCP: Owens Loffler, MD   ?Reason for Endocrinology Evaluation: Type 2 Diabetes Mellitus  ?Initial Endocrine Consultative Visit: 05/25/2019  ? ? ?PATIENT IDENTIFIER: Lonnie Jones is a 46 y.o. male with a past medical history of T2DM, Asthma,and  Dyslipidemia. The patient has followed with Endocrinology clinic since 05/25/2019 for consultative assistance with management of his diabetes. ? ?DIABETIC HISTORY:  ?Lonnie Jones was diagnosed with T2DM in 2016, he has been on oral glycemic agents since his diagnosis. No prior use of insulin. His hemoglobin A1c has ranged from  ?6.9% in 2019, peaking at 8.5% in 07/2017. ? ?On his initial visit to our clinic his A1c was 7.5%. He was on metformin, glipizide and pioglitazone. We reduced Metformin due to GI side effects and started farxiga.  ? ? ?Metformin stopped by 05/2021 and started Ozempic ? ? ?Works in sewer and drain cleaning ? ?SUBJECTIVE:  ? ?During the last visit (05/16/2021): A1c 7.1 %. We continued  Glipizide, pioglitazone , and  farxiga.  Stop metformin and started Ozempic ? ? ? ?Today (09/19/2021): Lonnie Jones is here for a follow up on diabetes management.  He checks his blood sugars a few times a day through the freestyle libre .The patient has not had hypoglycemic episodes since the last clinic visit.  ? ?He would like to switch freestyle libre to [facility] , because he is having issues with the cannula bending, and disparity of readings as well as sensor malfunction  ? ?Pt has been noted with weight loss  ?GI symptoms have resolved with metformin discontinuation  ? ?HOME DIABETES REGIMEN:  ?Farxiga 10 mg daily  ?Glipizide 5 mg,half before supper  ?Pioglitazone 45 mg daily  ?Ozempic 0.5 mg weekly ? ? ? ? ?Statin: Yes ?ACE-I/ARB: Yes ? ? ?CONTINUOUS GLUCOSE MONITORING RECORD INTERPRETATION   ? ?Dates of Recording: 12/2-12/15/2022 ? ?Sensor description: freestyle  libre ? ?Results statistics: ?  ?CGM use % of time 63  ?Average and SD 162/26.4  ?Time in range  73    %  ?% Time Above 180 22  ?% Time above 250 5  ?% Time Below target 0  ? ? ? ? ? ?Glycemic patterns summary: hyperglycemia noted mainly after supper , trends down over night Optimal during the day  ? ?Hyperglycemic episodes  postprandial ? ?Hypoglycemic episodes occurred fasting ? ?Overnight periods: trends down ? ? ? ? ? ? ?DIABETIC COMPLICATIONS: ?Microvascular complications:  ?Neuropathy ?Denies: CKD, retinopathy  ?Last eye exam: Completed 05/2021 ?  ?Macrovascular complications:  ?Denies: CAD, PVD, CVA ? ? ?HISTORY:  ?Past Medical History:  ?Past Medical History:  ?Diagnosis Date  ? Acute idiopathic gout involving toe 06/22/2017  ? Allergic rhinitis due to allergen 08/25/2011  ? Asthma   ? GERD (gastroesophageal reflux disease)   ? Hyperlipidemia LDL goal < 70 12/16/2010  ? Migraine headache   ? Type 2 diabetes mellitus with peripheral neuropathy ([place]) 08/08/2010  ? Qualifier: Diagnosis of  By: Copland MD, Frederico Hamman    ? ?Past Surgical History:  ?Past Surgical History:  ?Procedure Laterality Date  ? APPENDECTOMY  2000  ? CARPAL TUNNEL RELEASE    ? KNEE SURGERY  2011  ? ?Social History:  reports that he has quit smoking. His smokeless tobacco use includes snuff. He reports that he does not drink alcohol and does not use drugs. ?Family History:  ?Family History  ?  Problem Relation Age of Onset  ? Healthy Mother   ? Healthy Father   ? ? ? ?HOME MEDICATIONS: ?Allergies as of 09/19/2021   ? ?   Reactions  ? Codeine   ? ?  ? ?  ?Medication List  ?  ? ?  ? Accurate as of September 19, 2021  7:22 AM. If you have any questions, ask your nurse or doctor.  ?  ?  ? ?  ? ?albuterol 108 (90 Base) MCG/ACT inhaler ?Commonly known as: VENTOLIN HFA ?Inhale 1-2 puffs into the lungs every 6 (six) hours as needed for wheezing or shortness of breath. ?  ?azelastine 0.1 % nasal spray ?Commonly known as: ASTELIN ?INHALE 2 SPRAYS IN EACH NOSTRIL  TWICE A DAY ?  ?benzonatate 200 MG capsule ?Commonly known as: TESSALON ?Take 1 capsule (200 mg total) by mouth 2 (two) times daily as needed for cough. ?  ?EPINEPHrine 0.3 mg/0.3 mL Soaj injection ?Commonly known as: EPI-PEN ?See admin instructions. for allergic reaction ?  ?eszopiclone 1 MG Tabs tablet ?Commonly known as: LUNESTA ?TAKE 1 TABLET BY MOUTH AT BEDTIME AS NEEDED FOR SLEEP. TAKE IMMEDIATELY BEFORE BEDTIME ?  ?Farxiga 10 MG Tabs tablet ?Generic drug: dapagliflozin propanediol ?TAKE 1 TABLET (10 MG) BY MOUTH DAILY BEFORE BREAKFAST. TO FOLLOW '5MG'$  DOSE ?  ?fluticasone 50 MCG/ACT nasal spray ?Commonly known as: FLONASE ?SPRAY 1 SPRAY INTO EACH NOSTRIL EVERY DAY ?  ?FreeStyle Libre 2 Sensor Misc ?CHANGE SENSOR EVERY 14 DAYS ?  ?gabapentin 400 MG capsule ?Commonly known as: NEURONTIN ?TAKE 1 CAPSULE BY MOUTH 2 TIMES DAILY. ?  ?glipiZIDE 5 MG tablet ?Commonly known as: GLUCOTROL ?Take 1 tablet (5 mg total) by mouth daily before breakfast AND 0.5 tablets (2.5 mg total) daily before supper. ?  ?levocetirizine 5 MG tablet ?Commonly known as: XYZAL ?Take 5 mg by mouth every evening. ?  ?meloxicam 15 MG tablet ?Commonly known as: MOBIC ?TAKE 1 TABLET (15 MG TOTAL) BY MOUTH DAILY. ?  ?metFORMIN 500 MG 24 hr tablet ?Commonly known as: GLUCOPHAGE-XR ?TAKE 2 TABLETS BY MOUTH EVERY DAY WITH BREAKFAST ?  ?montelukast 10 MG tablet ?Commonly known as: SINGULAIR ?Take 1 tablet (10 mg total) by mouth daily. ?  ?olopatadine 0.1 % ophthalmic solution ?Commonly known as: PATANOL ?1 drop 2 (two) times daily. ?  ?Ozempic (0.25 or 0.5 MG/DOSE) 2 MG/1.5ML Sopn ?Generic drug: Semaglutide(0.25 or 0.'5MG'$ /DOS) ?Inject 0.5 mg into the skin once a week. ?  ?pioglitazone 45 MG tablet ?Commonly known as: ACTOS ?TAKE 1 TABLET BY MOUTH EVERY DAY ?  ?pravastatin 20 MG tablet ?Commonly known as: PRAVACHOL ?TAKE 1 TABLET BY MOUTH EVERY DAY ?  ?predniSONE 20 MG tablet ?Commonly known as: DELTASONE ?3 tabs by mouth daily x 3 days, then 2 tabs by  mouth daily x 2 days then 1 tab by mouth daily x 2 days ?  ?tiZANidine 4 MG tablet ?Commonly known as: ZANAFLEX ?TAKE 1 TABLET BY MOUTH AT BEDTIME AS NEEDED FOR MUSCLE SPASMS. ?  ? ?  ? ? ? ?OBJECTIVE:  ? ?Vital Signs:BP 124/76 (BP Location: Left Arm, Patient Position: Sitting, Cuff Size: Small)   Pulse 80   Ht '5\' 7"'$  (1.702 m)   Wt 222 lb 4.8 oz (100.8 kg)   SpO2 98%   BMI 34.82 kg/m?  ? ?Wt Readings from Last 3 Encounters:  ?05/16/21 235 lb 9.6 oz (106.9 kg)  ?04/10/21 234 lb (106.1 kg)  ?11/29/20 231 lb (104.8 kg)  ? ? ? ?Exam: ?  General: Pt appears well and is in NAD  ?Lungs: Clear with good BS bilat with no rales, rhonchi, or wheezes  ?Heart: RRR with normal S1 and S2 and no gallops; no murmurs; no rub  ?Abdomen: Normoactive bowel sounds, soft, nontender, without masses or organomegaly palpable  ?Extremities: No pretibial edema.   ?Neuro: MS is good with appropriate affect, pt is alert and Ox3  ? ? ?DM foot exam: 05/16/2021 ?The skin of the feet is intact without sores or ulcerations. ?The pedal pulses are 1+ on right and 1+ on left. ?The sensation is intact to a screening 5.07, 10 gram monofilament bilaterally ? ? ? ?DATA REVIEWED: ? ?Lab Results  ?Component Value Date  ? HGBA1C 7.1 (H) 04/10/2021  ? HGBA1C 6.7 (A) 11/29/2020  ? HGBA1C 6.8 (A) 07/26/2020  ? ?Lab Results  ?Component Value Date  ? MICROALBUR 1.1 04/10/2021  ? [place] 69 04/02/2020  ? CREATININE 0.86 04/10/2021  ? ?Lab Results  ?Component Value Date  ? MICRALBCREAT 2.6 04/10/2021  ? ? ? ?Lab Results  ?Component Value Date  ? CHOL 169 04/10/2021  ? HDL 41.40 04/10/2021  ? [place] 69 04/02/2020  ? LDLDIRECT 99.0 04/10/2021  ? TRIG 256.0 (H) 04/10/2021  ? CHOLHDL 4 04/10/2021  ?     ? ? ?ASSESSMENT / PLAN / RECOMMENDATIONS:  ? ?1) Type 2 Diabetes Mellitus, Optimally controlled, With Neuropathic complications - Most recent A1c of 6.6 %. Goal A1c < 7.0 %.   ? ?- Praised  pt on weight loss and improve glycemic control ?-He is intolerant to  metformin ?-He is tolerating Ozempic without side effect, will increase ?-We will also stop his glipizide due to risk of hypoglycemia with higher dose of Ozempic ? ?MEDICATIONS: ?-Stop metformin ?-Stop glipizide ?-Increase Ozemp

## 2021-09-19 NOTE — Patient Instructions (Addendum)
-   Keep Up the Good Work! ?- Increase Ozempic 1 mg weekly  ?- Stop Glipizide  ?- Continue Farxiga 10 mg tablets ?- Continue Pioglitazone 45 mg daily  ? ? ? ? ? ? ?HOW TO TREAT LOW BLOOD SUGARS (Blood sugar LESS THAN 70 MG/DL) ?Please follow the RULE OF 15 for the treatment of hypoglycemia treatment (when your (blood sugars are less than 70 mg/dL)  ? ?STEP 1: Take 15 grams of carbohydrates when your blood sugar is low, which includes:  ?3-4 GLUCOSE TABS  OR ?3-4 OZ OF JUICE OR REGULAR SODA OR ?ONE TUBE OF GLUCOSE GEL   ? ?STEP 2: RECHECK blood sugar in 15 MINUTES ?STEP 3: If your blood sugar is still low at the 15 minute recheck --> then, go back to STEP 1 and treat AGAIN with another 15 grams of carbohydrates. ? ?

## 2021-09-20 ENCOUNTER — Telehealth: Payer: Self-pay | Admitting: Pharmacy Technician

## 2021-09-20 ENCOUNTER — Encounter: Payer: Self-pay | Admitting: Internal Medicine

## 2021-09-20 NOTE — Telephone Encounter (Signed)
Patient Advocate Encounter ? ?Received notification from [employer] that prior authorization for [place] is required. ?  ?PA submitted on 4.21.23 ?Key [id number] ?Status is pending ?  ? Clinic will continue to follow ? ?Charley Lafrance R Aul Mangieri, CPhT ?Patient Advocate ?[facility] ?Phone: [phone] ?Fax:  [phone] ? ?

## 2021-09-25 ENCOUNTER — Other Ambulatory Visit (HOSPITAL_COMMUNITY): Payer: Self-pay

## 2021-09-25 NOTE — Telephone Encounter (Signed)
Patient Advocate Encounter ? ?Prior Authorization for Dexcom G7 sensors has been approved.   ? ?PA# N/A ? ?Effective dates: 09/20/21 through 09/19/22 ? ?Refill too soon. ? ? ?Patient Advocate ?Fax: [phone]  ?

## 2021-10-09 ENCOUNTER — Other Ambulatory Visit: Payer: Self-pay | Admitting: Family Medicine

## 2021-10-10 NOTE — Telephone Encounter (Signed)
Last office visit 04/07/21 for CPE.  Last refilled 06/07/21 for #30 with 3 refills.  No future appointments with PCP. ?

## 2021-11-05 DIAGNOSIS — H1045 Other chronic allergic conjunctivitis: Secondary | ICD-10-CM | POA: Diagnosis not present

## 2021-11-05 DIAGNOSIS — J4520 Mild intermittent asthma, uncomplicated: Secondary | ICD-10-CM | POA: Diagnosis not present

## 2021-11-05 DIAGNOSIS — J3089 Other allergic rhinitis: Secondary | ICD-10-CM | POA: Diagnosis not present

## 2021-11-05 DIAGNOSIS — J301 Allergic rhinitis due to pollen: Secondary | ICD-10-CM | POA: Diagnosis not present

## 2021-11-08 ENCOUNTER — Encounter: Payer: Self-pay | Admitting: Internal Medicine

## 2021-11-08 ENCOUNTER — Other Ambulatory Visit: Payer: Self-pay

## 2021-11-08 DIAGNOSIS — E1142 Type 2 diabetes mellitus with diabetic polyneuropathy: Secondary | ICD-10-CM

## 2021-11-08 MED ORDER — FREESTYLE PRECISION NEO TEST VI STRP: ORAL_STRIP | 12 refills | Status: DC

## 2021-11-13 ENCOUNTER — Telehealth: Payer: Self-pay

## 2021-11-13 ENCOUNTER — Other Ambulatory Visit: Payer: Self-pay

## 2021-11-13 DIAGNOSIS — E1142 Type 2 diabetes mellitus with diabetic polyneuropathy: Secondary | ICD-10-CM

## 2021-11-13 MED ORDER — OZEMPIC (1 MG/DOSE) 4 MG/3ML ~~LOC~~ SOPN: 1.0000 mg | PEN_INJECTOR | SUBCUTANEOUS | 3 refills | Status: DC

## 2021-11-13 MED ORDER — CONTOUR NEXT TEST VI STRP: ORAL_STRIP | 12 refills | Status: AC

## 2021-11-13 MED ORDER — CONTOUR NEXT EZ W/DEVICE KIT: PACK | 0 refills | Status: DC

## 2021-11-13 NOTE — Telephone Encounter (Signed)
Preferred meter sent

## 2021-11-13 NOTE — Telephone Encounter (Signed)
Script sent for Ozempzic '1mg'$  dose.

## 2021-11-22 ENCOUNTER — Other Ambulatory Visit: Payer: Self-pay | Admitting: Family Medicine

## 2022-02-09 ENCOUNTER — Other Ambulatory Visit: Payer: Self-pay | Admitting: Family Medicine

## 2022-02-19 ENCOUNTER — Encounter: Payer: Self-pay | Admitting: Internal Medicine

## 2022-02-19 ENCOUNTER — Ambulatory Visit (AMBULATORY_SURGERY_CENTER): Payer: BC Managed Care – PPO | Admitting: *Deleted

## 2022-02-19 VITALS — Ht 67.0 in | Wt 226.0 lb

## 2022-02-19 DIAGNOSIS — Z1211 Encounter for screening for malignant neoplasm of colon: Secondary | ICD-10-CM

## 2022-02-19 MED ORDER — NA SULFATE-K SULFATE-MG SULF 17.5-3.13-1.6 GM/177ML PO SOLN: 1.0000 | ORAL | 0 refills | Status: DC

## 2022-02-19 NOTE — Progress Notes (Signed)
Patient is here in-person for PV. Patient denies any allergies to eggs or soy. Patient denies any problems with anesthesia/sedation. Patient is not on any oxygen at home. Patient is not taking any diet/weight loss medications or blood thinners. Went over procedure prep instructions with the patient. Patient is aware of our care-partner policy. Patient notified to use Good-Rx for prescription.    

## 2022-02-25 ENCOUNTER — Other Ambulatory Visit: Payer: Self-pay | Admitting: Orthopedic Surgery

## 2022-02-25 DIAGNOSIS — M75101 Unspecified rotator cuff tear or rupture of right shoulder, not specified as traumatic: Secondary | ICD-10-CM

## 2022-02-25 DIAGNOSIS — M25511 Pain in right shoulder: Secondary | ICD-10-CM | POA: Diagnosis not present

## 2022-02-25 DIAGNOSIS — M542 Cervicalgia: Secondary | ICD-10-CM | POA: Diagnosis not present

## 2022-02-26 ENCOUNTER — Encounter: Payer: Self-pay | Admitting: Internal Medicine

## 2022-02-26 ENCOUNTER — Ambulatory Visit (AMBULATORY_SURGERY_CENTER): Payer: BC Managed Care – PPO | Admitting: Internal Medicine

## 2022-02-26 VITALS — BP 110/78 | HR 88 | Temp 98.4°F | Resp 12 | Ht 67.0 in | Wt 226.0 lb

## 2022-02-26 DIAGNOSIS — D125 Benign neoplasm of sigmoid colon: Secondary | ICD-10-CM | POA: Diagnosis not present

## 2022-02-26 DIAGNOSIS — D127 Benign neoplasm of rectosigmoid junction: Secondary | ICD-10-CM | POA: Diagnosis not present

## 2022-02-26 DIAGNOSIS — D123 Benign neoplasm of transverse colon: Secondary | ICD-10-CM

## 2022-02-26 DIAGNOSIS — Z1211 Encounter for screening for malignant neoplasm of colon: Secondary | ICD-10-CM | POA: Diagnosis not present

## 2022-02-26 DIAGNOSIS — D128 Benign neoplasm of rectum: Secondary | ICD-10-CM

## 2022-02-26 DIAGNOSIS — D124 Benign neoplasm of descending colon: Secondary | ICD-10-CM

## 2022-02-26 MED ORDER — SODIUM CHLORIDE 0.9 % IV SOLN: 500.0000 mL | Freq: Once | INTRAVENOUS | Status: DC

## 2022-02-26 NOTE — Progress Notes (Signed)
Pt resting comfortably. VSS. Airway intact. SBAR complete to RN. All questions answered.   

## 2022-02-26 NOTE — Progress Notes (Signed)
Pt's states no medical or surgical changes since previsit or office visit. 

## 2022-02-26 NOTE — Op Note (Signed)
[place] Patient Name: Lonnie Jones Procedure Date: 02/26/2022 11:15 AM MRN: [id number] Endoscopist: Sonny Masters "[name] ,  Age: 46 Referring MD:  Date of Birth: May 14, 1976 Gender: Male Account #: [id number] Procedure:                Colonoscopy Indications:              Screening for colorectal malignant neoplasm, This                            is the patient's first colonoscopy Medicines:                Monitored Anesthesia Care Procedure:                Pre-Anesthesia Assessment:                           - Prior to the procedure, a History and Physical                            was performed, and patient medications and                            allergies were reviewed. The patient's tolerance of                            previous anesthesia was also reviewed. The risks                            and benefits of the procedure and the sedation                            options and risks were discussed with the patient.                            All questions were answered, and informed consent                            was obtained. Prior Anticoagulants: The patient has                            taken no previous anticoagulant or antiplatelet                            agents. ASA Grade Assessment: II - A patient with                            mild systemic disease. After reviewing the risks                            and benefits, the patient was deemed in                            satisfactory condition to undergo the procedure.  After obtaining informed consent, the colonoscope                            was passed under direct vision. Throughout the                            procedure, the patient's blood pressure, pulse, and                            oxygen saturations were monitored continuously. The                            Olympus CF-HQ190L [id number]) Colonoscope was                            introduced through the anus  and advanced to the the                            terminal ileum. The colonoscopy was performed                            without difficulty. The patient tolerated the                            procedure well. The quality of the bowel                            preparation was good. The terminal ileum, ileocecal                            valve, appendiceal orifice, and rectum were                            photographed. Scope In: 11:22:21 AM Scope Out: 11:45:26 AM Scope Withdrawal Time: 0 hours 17 minutes 19 seconds  Total Procedure Duration: 0 hours 23 minutes 5 seconds  Findings:                 The terminal ileum appeared normal.                           Four sessile polyps were found in the rectum,                            sigmoid colon, descending colon and transverse                            colon. The polyps were 3 to 6 mm in size. These                            polyps were removed with a cold snare. Resection                            and retrieval were complete.  Non-bleeding internal hemorrhoids were found during                            retroflexion. Complications:            No immediate complications. Estimated Blood Loss:     Estimated blood loss was minimal. Impression:               - The examined portion of the ileum was normal.                           - Four 3 to 6 mm polyps in the rectum, in the                            sigmoid colon, in the descending colon and in the                            transverse colon, removed with a cold snare.                            Resected and retrieved.                           - Non-bleeding internal hemorrhoids. Recommendation:           - Discharge patient to home (with escort).                           - Await pathology results.                           - The findings and recommendations were discussed                            with the patient. Dr Georgian Co "Lyndee Leo"  Lorenso Courier,  02/26/2022 11:52:06 AM

## 2022-02-26 NOTE — Progress Notes (Signed)
Called to room to assist during endoscopic procedure.  Patient ID and intended procedure confirmed with present staff. Received instructions for my participation in the procedure from the performing physician.  

## 2022-02-26 NOTE — Progress Notes (Signed)
GASTROENTEROLOGY PROCEDURE H&P NOTE   Primary Care Physician: Owens Loffler, MD    Reason for Procedure:   Colon cancer screening  Plan:    Colonoscopy  Patient is appropriate for endoscopic procedure(s) in the ambulatory ([place]) setting.  The nature of the procedure, as well as the risks, benefits, and alternatives were carefully and thoroughly reviewed with the patient. Ample time for discussion and questions allowed. The patient understood, was satisfied, and agreed to proceed.     HPI: Lonnie Jones is a 46 y.o. male who presents for colonoscopy for colon cancer screening. Denies blood in stools, changes in bowel habits, weight loss. Denies family history of colon cancer.  Past Medical History:  Diagnosis Date   Acute idiopathic gout involving toe 06/22/2017   Allergic rhinitis due to allergen 08/25/2011   Asthma    GERD (gastroesophageal reflux disease)    Hyperlipidemia LDL goal < 70 12/16/2010   Migraine headache    Type 2 diabetes mellitus with peripheral neuropathy ([place]) 08/08/2010   Qualifier: Diagnosis of  By: Lorelei Pont MD, Frederico Hamman      Past Surgical History:  Procedure Laterality Date   ANKLE SURGERY     APPENDECTOMY  06/02/1998   CARPAL TUNNEL RELEASE     GANGLION CYST EXCISION     KNEE ARTHROSCOPY W/ ACL RECONSTRUCTION AND PATELLA GRAFT     KNEE SURGERY  06/02/2009   SHOULDER SURGERY Left     Prior to Admission medications   Medication Sig Start Date End Date Taking? Authorizing Provider  albuterol (VENTOLIN HFA) 108 (90 Base) MCG/ACT inhaler Inhale 1-2 puffs into the lungs every 6 (six) hours as needed for wheezing or shortness of breath. 06/04/20   Vanessa Kick, MD  azelastine (ASTELIN) 0.1 % nasal spray INHALE 2 SPRAYS IN EACH NOSTRIL TWICE A DAY 11/12/18   [provider]  Blood Glucose Monitoring Suppl (CONTOUR NEXT EZ) w/Device KIT Check blood sugar 2 times daily as needed 11/13/21   Shamleffer, Melanie Crazier, MD  Continuous Blood Gluc  Sensor (DEXCOM G7 SENSOR) MISC 1 Device by Does not apply route as directed. 09/19/21   Shamleffer, Melanie Crazier, MD  eszopiclone (LUNESTA) 1 MG TABS tablet TAKE 1 TABLET BY MOUTH AT BEDTIME AS NEEDED FOR SLEEP. TAKE IMMEDIATELY BEFORE BEDTIME 02/10/22   Copland, Frederico Hamman, MD  FARXIGA 10 MG TABS tablet TAKE 1 TABLET (10 MG) BY MOUTH DAILY BEFORE BREAKFAST. TO FOLLOW 5MG DOSE 05/06/21   Shamleffer, Melanie Crazier, MD  fluticasone (FLONASE) 50 MCG/ACT nasal spray SPRAY 1 SPRAY INTO EACH NOSTRIL EVERY DAY 12/09/18   [provider]  gabapentin (NEURONTIN) 400 MG capsule TAKE 1 CAPSULE BY MOUTH 2 TIMES DAILY. 05/06/21   Copland, Frederico Hamman, MD  glucose blood (CONTOUR NEXT TEST) test strip Check blood sugar 2 times daily as needed 11/13/21   Shamleffer, Melanie Crazier, MD  levocetirizine (XYZAL) 5 MG tablet Take 5 mg by mouth every evening.    [provider]  meloxicam (MOBIC) 15 MG tablet TAKE 1 TABLET (15 MG TOTAL) BY MOUTH DAILY. 02/07/21   Copland, Frederico Hamman, MD  montelukast (SINGULAIR) 10 MG tablet Take 1 tablet (10 mg total) by mouth daily. 04/02/20   Copland, Frederico Hamman, MD  pioglitazone (ACTOS) 45 MG tablet TAKE 1 TABLET BY MOUTH EVERY DAY 05/06/21   Shamleffer, Melanie Crazier, MD  pravastatin (PRAVACHOL) 20 MG tablet Take 1 tablet by mouth once daily 11/22/21   Copland, Spencer, MD  Semaglutide, 1 MG/DOSE, (OZEMPIC, 1 MG/DOSE,) 4 MG/3ML SOPN Inject  1 mg into the skin once a week. 11/13/21   Shamleffer, Melanie Crazier, MD  tiZANidine (ZANAFLEX) 4 MG tablet TAKE 1 TABLET BY MOUTH AT BEDTIME AS NEEDED FOR MUSCLE SPASMS. 01/24/21   Owens Loffler, MD    Current Outpatient Medications  Medication Sig Dispense Refill   albuterol (VENTOLIN HFA) 108 (90 Base) MCG/ACT inhaler Inhale 1-2 puffs into the lungs every 6 (six) hours as needed for wheezing or shortness of breath. 1 each 1   azelastine (ASTELIN) 0.1 % nasal spray INHALE 2 SPRAYS IN EACH NOSTRIL TWICE A DAY     Blood Glucose Monitoring  Suppl (CONTOUR NEXT EZ) w/Device KIT Check blood sugar 2 times daily as needed 1 kit 0   Continuous Blood Gluc Sensor (DEXCOM G7 SENSOR) MISC 1 Device by Does not apply route as directed. 9 each 3   eszopiclone (LUNESTA) 1 MG TABS tablet TAKE 1 TABLET BY MOUTH AT BEDTIME AS NEEDED FOR SLEEP. TAKE IMMEDIATELY BEFORE BEDTIME 30 tablet 3   FARXIGA 10 MG TABS tablet TAKE 1 TABLET (10 MG) BY MOUTH DAILY BEFORE BREAKFAST. TO FOLLOW 5MG DOSE 90 tablet 2   fluticasone (FLONASE) 50 MCG/ACT nasal spray SPRAY 1 SPRAY INTO EACH NOSTRIL EVERY DAY     gabapentin (NEURONTIN) 400 MG capsule TAKE 1 CAPSULE BY MOUTH 2 TIMES DAILY. 180 capsule 3   glucose blood (CONTOUR NEXT TEST) test strip Check blood sugar 2 times daily as needed 100 each 12   levocetirizine (XYZAL) 5 MG tablet Take 5 mg by mouth every evening.     meloxicam (MOBIC) 15 MG tablet TAKE 1 TABLET (15 MG TOTAL) BY MOUTH DAILY. 90 tablet 3   montelukast (SINGULAIR) 10 MG tablet Take 1 tablet (10 mg total) by mouth daily. 90 tablet 3   pioglitazone (ACTOS) 45 MG tablet TAKE 1 TABLET BY MOUTH EVERY DAY 90 tablet 3   pravastatin (PRAVACHOL) 20 MG tablet Take 1 tablet by mouth once daily 90 tablet 1   Semaglutide, 1 MG/DOSE, (OZEMPIC, 1 MG/DOSE,) 4 MG/3ML SOPN Inject 1 mg into the skin once a week. 9 mL 3   tiZANidine (ZANAFLEX) 4 MG tablet TAKE 1 TABLET BY MOUTH AT BEDTIME AS NEEDED FOR MUSCLE SPASMS. 90 tablet 3   Current Facility-Administered Medications  Medication Dose Route Frequency Provider Last Rate Last Admin   0.9 %  sodium chloride infusion  500 mL Intravenous Once Sharyn Creamer, MD        Allergies as of 02/26/2022 - Review Complete 02/26/2022  Allergen Reaction Noted   Codeine Itching 08/08/2010    Family History  Problem Relation Age of Onset   Healthy Mother    Healthy Father    Colon cancer Neg Hx    Colon polyps Neg Hx    Esophageal cancer Neg Hx    Stomach cancer Neg Hx    Rectal cancer Neg Hx     Social History    Socioeconomic History   Marital status: Married    Spouse name: Not on file   Number of children: 2   Years of education: Not on file   Highest education level: Not on file  Occupational History    Comment: Sewer  Tobacco Use   Smoking status: Former    Types: Cigarettes   Smokeless tobacco: Current    Types: Snuff  Substance and Sexual Activity   Alcohol use: Yes    Alcohol/week: 1.0 standard drink of alcohol    Types: 1 Standard drinks or equivalent  per week   Drug use: No   Sexual activity: Yes    Birth control/protection: None    Comment: Married  Other Topics Concern   Not on file  Social History Narrative   Not on file   Social Determinants of Health   Financial Resource Strain: Not on file  Food Insecurity: Not on file  Transportation Needs: Not on file  Physical Activity: Not on file  Stress: Not on file  Social Connections: Not on file  Intimate Partner Violence: Not on file    Physical Exam: Vital signs in last 24 hours: BP 112/77   Pulse 81   Temp 98.4 F (36.9 C) (Temporal)   Ht _0  (1.702 m)   Wt 226 lb (102.5 kg)   SpO2 100%   BMI 35.40 kg/m  GEN: NAD EYE: Sclerae anicteric ENT: MMM CV: Non-tachycardic Pulm: No increased work of breathing GI: Soft, NT/ND NEURO:  Alert & Oriented   [name], MD [facility]  02/26/2022 10:38 AM

## 2022-02-26 NOTE — Patient Instructions (Signed)
Await pathology results.  Handout on polyps provided.  YOU HAD AN ENDOSCOPIC PROCEDURE TODAY AT THE [facility]:   Refer to the procedure report that was given to you for any specific questions about what was found during the examination.  If the procedure report does not answer your questions, please call your gastroenterologist to clarify.  If you requested that your care partner not be given the details of your procedure findings, then the procedure report has been included in a sealed envelope for you to review at your convenience later.  YOU SHOULD EXPECT: Some feelings of bloating in the abdomen. Passage of more gas than usual.  Walking can help get rid of the air that was put into your GI tract during the procedure and reduce the bloating. If you had a lower endoscopy (such as a colonoscopy or flexible sigmoidoscopy) you may notice spotting of blood in your stool or on the toilet paper. If you underwent a bowel prep for your procedure, you may not have a normal bowel movement for a few days.  Please Note:  You might notice some irritation and congestion in your nose or some drainage.  This is from the oxygen used during your procedure.  There is no need for concern and it should clear up in a day or so.  SYMPTOMS TO REPORT IMMEDIATELY:  Following lower endoscopy (colonoscopy or flexible sigmoidoscopy):  Excessive amounts of blood in the stool  Significant tenderness or worsening of abdominal pains  Swelling of the abdomen that is new, acute  Fever of 100F or higher   For urgent or emergent issues, a gastroenterologist can be reached at any hour by calling [phone]. Do not use MyChart messaging for urgent concerns.    DIET:  We do recommend a small meal at first, but then you may proceed to your regular diet.  Drink plenty of fluids but you should avoid alcoholic beverages for 24 hours.  ACTIVITY:  You should plan to take it easy for the rest of today and you should  NOT DRIVE or use heavy machinery until tomorrow (because of the sedation medicines used during the test).    FOLLOW UP: Our staff will call the number listed on your records the next business day following your procedure.  We will call around 7:15- 8:00 am to check on you and address any questions or concerns that you may have regarding the information given to you following your procedure. If we do not reach you, we will leave a message.     If any biopsies were taken you will be contacted by phone or by letter within the next 1-3 weeks.  Please call us at [phone] if you have not heard about the biopsies in 3 weeks.    SIGNATURES/CONFIDENTIALITY: You and/or your care partner have signed paperwork which will be entered into your electronic medical record.  These signatures attest to the fact that that the information above on your After Visit Summary has been reviewed and is understood.  Full responsibility of the confidentiality of this discharge information lies with you and/or your care-partner.  

## 2022-02-27 ENCOUNTER — Telehealth: Payer: Self-pay

## 2022-02-27 NOTE — Telephone Encounter (Signed)
  Follow up Call-     02/26/2022   10:28 AM  Call back number  Post procedure Call Back phone  # [phone]  Permission to leave phone message Yes     Patient questions:  Do you have a fever, pain , or abdominal swelling? No. Pain Score  0 *  Have you tolerated food without any problems? Yes.    Have you been able to return to your normal activities? Yes.    Do you have any questions about your discharge instructions: Diet   No. Medications  No. Follow up visit  No.  Do you have questions or concerns about your Care? No.  Actions: * If pain score is 4 or above: No action needed, pain <4.

## 2022-02-28 ENCOUNTER — Encounter: Payer: Self-pay | Admitting: Internal Medicine

## 2022-03-11 ENCOUNTER — Ambulatory Visit
Admission: RE | Admit: 2022-03-11 | Discharge: 2022-03-11 | Disposition: A | Discharge status: Home / Self Care | Payer: BC Managed Care – PPO | Source: Ambulatory Visit | Attending: Orthopedic Surgery | Admitting: Orthopedic Surgery

## 2022-03-11 DIAGNOSIS — M75101 Unspecified rotator cuff tear or rupture of right shoulder, not specified as traumatic: Secondary | ICD-10-CM

## 2022-03-11 DIAGNOSIS — M25511 Pain in right shoulder: Secondary | ICD-10-CM | POA: Diagnosis not present

## 2022-03-12 ENCOUNTER — Encounter: Payer: Self-pay | Admitting: Nurse Practitioner

## 2022-03-12 ENCOUNTER — Ambulatory Visit (INDEPENDENT_AMBULATORY_CARE_PROVIDER_SITE_OTHER): Payer: BC Managed Care – PPO | Admitting: Nurse Practitioner

## 2022-03-12 VITALS — BP 102/66 | HR 76 | Temp 97.2°F | Resp 14 | Wt 225.1 lb

## 2022-03-12 DIAGNOSIS — J020 Streptococcal pharyngitis: Secondary | ICD-10-CM | POA: Insufficient documentation

## 2022-03-12 DIAGNOSIS — J029 Acute pharyngitis, unspecified: Secondary | ICD-10-CM | POA: Diagnosis not present

## 2022-03-12 LAB — POC COVID19 BINAXNOW: SARS Coronavirus 2 Ag: NEGATIVE

## 2022-03-12 LAB — POCT RAPID STREP A (OFFICE): Rapid Strep A Screen: POSITIVE — AB

## 2022-03-12 MED ORDER — AMOXICILLIN 500 MG PO CAPS: 500.0000 mg | ORAL_CAPSULE | Freq: Two times a day (BID) | ORAL | 0 refills | Status: DC

## 2022-03-12 NOTE — Patient Instructions (Signed)
Nice to see you today I have sent the antibiotic to the pharmacy Take the medication to completeness  You can use throat lozenges, ibuprofen/tylenol, and slat water gargles as needed for the pian in your throat  Follow up if no improvement

## 2022-03-12 NOTE — Assessment & Plan Note (Signed)
COVID and strep test in office. 

## 2022-03-12 NOTE — Progress Notes (Signed)
Acute Office Visit  Subjective:     Patient ID: Lonnie Jones, male    DOB: 02-Dec-1975, 46 y.o.   MRN: [id number]  Chief Complaint  Patient presents with   Nasal Congestion    Sx started 3 days ago- Ears topped up, scratchy irritated throat, post nasal drip. Covid test on 03/11/22 was negative. Always had issues with enlarged tonsils.    HPI Patient is in today for   Symptoms started 3 days  No sick contact Covid test negative today and yesterday  States that his wife CPAP air blew in his face On clarinex, xyzal, allerga, Singular. Sees allergy and asthma  Stats that he has not tired anything over the counter but some tylenol Review of Systems  Constitutional:  Positive for malaise/fatigue. Negative for chills and fever.  HENT:  Positive for ear pain, sinus pain and sore throat. Negative for ear discharge.   Respiratory:  Negative for shortness of breath and wheezing.   Cardiovascular:  Negative for chest pain.  Neurological:  Positive for headaches.        Objective:    BP 102/66   Pulse 76   Temp (!) 97.2 F (36.2 C)   Resp 14   Wt 225 lb 2 oz (102.1 kg)   SpO2 95%   BMI 35.26 kg/m    Physical Exam Vitals and nursing note reviewed.  Constitutional:      Appearance: Normal appearance.  HENT:     Right Ear: Tympanic membrane, ear canal and external ear normal.     Left Ear: Tympanic membrane, ear canal and external ear normal.     Nose:     Right Sinus: No maxillary sinus tenderness or frontal sinus tenderness.     Left Sinus: No maxillary sinus tenderness or frontal sinus tenderness.     Mouth/Throat:     Mouth: Mucous membranes are moist.     Pharynx: Posterior oropharyngeal erythema present.  Cardiovascular:     Rate and Rhythm: Normal rate and regular rhythm.     Heart sounds: Normal heart sounds.  Pulmonary:     Effort: Pulmonary effort is normal.     Breath sounds: Normal breath sounds.  Lymphadenopathy:     Cervical: No cervical adenopathy.   Neurological:     Mental Status: He is alert.     Results for orders placed or performed in visit on 03/12/22  Rapid Strep A  Result Value Ref Range   Rapid Strep A Screen Positive (A) Negative  POC COVID-19  Result Value Ref Range   SARS Coronavirus 2 Ag Negative Negative        Assessment & Plan:   Problem List Items Addressed This Visit       Respiratory   Strep pharyngitis    Strep test positive in office.  We will treat patient with amoxicillin 500 mg twice daily for 10 days.  Patient directed to finish the entire course of antibiotics.  He can use over-the-counter analgesics such as ibuprofen or Tylenol, throat lozenges, salt water gargles for symptomatic relief.  Follow-up if no improvement      Relevant Medications   amoxicillin (AMOXIL) 500 MG capsule     Other   Sore throat - Primary    COVID and strep test in office.      Relevant Orders   Rapid Strep A (Completed)   POC COVID-19 (Completed)    Meds ordered this encounter  Medications   amoxicillin (AMOXIL) 500 MG capsule  Sig: Take 1 capsule (500 mg total) by mouth 2 (two) times daily for 10 days.    Dispense:  20 capsule    Refill:  0    Order Specific Question:   Supervising Provider    Answer:   TOWER, MARNE A [[id number]]    Return if symptoms worsen or fail to improve.  Romilda Garret, NP

## 2022-03-12 NOTE — Assessment & Plan Note (Signed)
Strep test positive in office.  We will treat patient with amoxicillin 500 mg twice daily for 10 days.  Patient directed to finish the entire course of antibiotics.  He can use over-the-counter analgesics such as ibuprofen or Tylenol, throat lozenges, salt water gargles for symptomatic relief.  Follow-up if no improvement

## 2022-03-17 ENCOUNTER — Encounter: Payer: Self-pay | Admitting: Internal Medicine

## 2022-03-17 ENCOUNTER — Ambulatory Visit: Payer: BC Managed Care – PPO | Admitting: Internal Medicine

## 2022-03-17 VITALS — BP 120/80 | HR 94 | Ht 67.0 in | Wt 223.0 lb

## 2022-03-17 DIAGNOSIS — E1142 Type 2 diabetes mellitus with diabetic polyneuropathy: Secondary | ICD-10-CM | POA: Diagnosis not present

## 2022-03-17 DIAGNOSIS — M25511 Pain in right shoulder: Secondary | ICD-10-CM | POA: Diagnosis not present

## 2022-03-17 LAB — POCT GLUCOSE (DEVICE FOR HOME USE): POC Glucose: 153 mg/dl — AB (ref 70–99)

## 2022-03-17 LAB — POCT GLYCOSYLATED HEMOGLOBIN (HGB A1C): Hemoglobin A1C: 6.6 % — AB (ref 4.0–5.6)

## 2022-03-17 MED ORDER — PIOGLITAZONE HCL 45 MG PO TABS: 45.0000 mg | ORAL_TABLET | Freq: Every day | ORAL | 3 refills | Status: DC

## 2022-03-17 MED ORDER — OZEMPIC (1 MG/DOSE) 4 MG/3ML ~~LOC~~ SOPN: 1.0000 mg | PEN_INJECTOR | SUBCUTANEOUS | 3 refills | Status: DC

## 2022-03-17 MED ORDER — DAPAGLIFLOZIN PROPANEDIOL 10 MG PO TABS: 10.0000 mg | ORAL_TABLET | Freq: Every day | ORAL | 3 refills | Status: DC

## 2022-03-17 NOTE — Patient Instructions (Signed)
-   A1C 6.6% on 03/17/2022 - Continue Ozempic 1 mg weekly  - Continue Farxiga 10 mg tablets - Continue Pioglitazone 45 mg daily        HOW TO TREAT LOW BLOOD SUGARS (Blood sugar LESS THAN 70 MG/DL) Please follow the RULE OF 15 for the treatment of hypoglycemia treatment (when your (blood sugars are less than 70 mg/dL)   STEP 1: Take 15 grams of carbohydrates when your blood sugar is low, which includes:  3-4 GLUCOSE TABS  OR 3-4 OZ OF JUICE OR REGULAR SODA OR ONE TUBE OF GLUCOSE GEL    STEP 2: RECHECK blood sugar in 15 MINUTES STEP 3: If your blood sugar is still low at the 15 minute recheck --> then, go back to STEP 1 and treat AGAIN with another 15 grams of carbohydrates.

## 2022-03-17 NOTE — Progress Notes (Signed)
Name: Lonnie Jones  Age/ Sex: 46 y.o., male   MRN/ DOB: [id number], 10/11/1975     PCP: Owens Loffler, MD   Reason for Endocrinology Evaluation: Type 2 Diabetes Mellitus  Initial Endocrine Consultative Visit: 05/25/2019    PATIENT IDENTIFIER: Lonnie Jones is a 46 y.o. male with a past medical history of T2DM, Asthma,and  Dyslipidemia. The patient has followed with Endocrinology clinic since 05/25/2019 for consultative assistance with management of his diabetes.  DIABETIC HISTORY:  Lonnie Jones was diagnosed with T2DM in 2016, he has been on oral glycemic agents since his diagnosis. No prior use of insulin. His hemoglobin A1c has ranged from  6.9% in 2019, peaking at 8.5% in 07/2017.  On his initial visit to our clinic his A1c was 7.5%. He was on metformin, glipizide and pioglitazone. We reduced Metformin due to GI side effects and started farxiga.    Metformin stopped by 05/2021 due to GI side effects and started Ozempic  Glipizide stopped in April 2023 with an A1c of 6.6% to prevent hypoglycemia  Works in sewer and drain cleaning  SUBJECTIVE:   During the last visit (09/19/2021): A1c 6.6 %.     Today (03/17/2022): Lonnie Jones is here for a follow up on diabetes management.  He checks his blood sugars a few times a day through the dexcom G7 .The patient has not had hypoglycemic episodes since the last clinic visit.   Weight stable   We are unable to download dexcom   Denies nausea, vomiting or diarrhea but has constipation  He injured his left shoulder  with a tendon tear, pending left shoulder sx    HOME DIABETES REGIMEN:  Farxiga 10 mg daily  Pioglitazone 45 mg daily  Ozempic 1 mg weekly     Statin: Yes ACE-I/ARB: Yes    DIABETIC COMPLICATIONS: Microvascular complications:  Neuropathy Denies: CKD, retinopathy  Last eye exam: Completed 05/2021   Macrovascular complications:  Denies: CAD, PVD, CVA      HISTORY:  Past Medical History:  Past  Medical History:  Diagnosis Date   Acute idiopathic gout involving toe 06/22/2017   Allergic rhinitis due to allergen 08/25/2011   Asthma    GERD (gastroesophageal reflux disease)    Hyperlipidemia LDL goal < 70 12/16/2010   Migraine headache    Type 2 diabetes mellitus with peripheral neuropathy ([place]) 08/08/2010   Qualifier: Diagnosis of  By: Lorelei Pont MD, Frederico Hamman     Past Surgical History:  Past Surgical History:  Procedure Laterality Date   ANKLE SURGERY     APPENDECTOMY  06/02/1998   CARPAL TUNNEL RELEASE     GANGLION CYST EXCISION     KNEE ARTHROSCOPY W/ ACL RECONSTRUCTION AND PATELLA GRAFT     KNEE SURGERY  06/02/2009   SHOULDER SURGERY Left    Social History:  reports that he has quit smoking. His smoking use included cigarettes. His smokeless tobacco use includes snuff. He reports current alcohol use of about 1.0 standard drink of alcohol per week. He reports that he does not use drugs. Family History:  Family History  Problem Relation Age of Onset   Healthy Mother    Healthy Father    Colon cancer Neg Hx    Colon polyps Neg Hx    Esophageal cancer Neg Hx    Stomach cancer Neg Hx    Rectal cancer Neg Hx      HOME MEDICATIONS: Allergies as of 03/17/2022       Reactions  Codeine Itching        Medication List        Accurate as of March 17, 2022  8:22 AM. If you have any questions, ask your nurse or doctor.          albuterol 108 (90 Base) MCG/ACT inhaler Commonly known as: VENTOLIN HFA Inhale 1-2 puffs into the lungs every 6 (six) hours as needed for wheezing or shortness of breath.   amoxicillin 500 MG capsule Commonly known as: AMOXIL Take 1 capsule (500 mg total) by mouth 2 (two) times daily for 10 days.   azelastine 0.1 % nasal spray Commonly known as: ASTELIN INHALE 2 SPRAYS IN EACH NOSTRIL TWICE A DAY   Contour Next EZ w/Device Kit Check blood sugar 2 times daily as needed   Contour Next Test test strip Generic drug: glucose blood Check  blood sugar 2 times daily as needed   desloratadine 5 MG tablet Commonly known as: CLARINEX Take 5 mg by mouth daily.   Dexcom G7 Sensor Misc 1 Device by Does not apply route as directed.   eszopiclone 1 MG Tabs tablet Commonly known as: LUNESTA TAKE 1 TABLET BY MOUTH AT BEDTIME AS NEEDED FOR SLEEP. TAKE IMMEDIATELY BEFORE BEDTIME   Farxiga 10 MG Tabs tablet Generic drug: dapagliflozin propanediol TAKE 1 TABLET (10 MG) BY MOUTH DAILY BEFORE BREAKFAST. TO FOLLOW $RemoveB'5MG'[id number]$  DOSE   fluticasone 50 MCG/ACT nasal spray Commonly known as: FLONASE SPRAY 1 SPRAY INTO EACH NOSTRIL EVERY DAY   gabapentin 400 MG capsule Commonly known as: NEURONTIN TAKE 1 CAPSULE BY MOUTH 2 TIMES DAILY.   levocetirizine 5 MG tablet Commonly known as: XYZAL Take 5 mg by mouth every evening.   meloxicam 15 MG tablet Commonly known as: MOBIC TAKE 1 TABLET (15 MG TOTAL) BY MOUTH DAILY.   montelukast 10 MG tablet Commonly known as: SINGULAIR Take 1 tablet (10 mg total) by mouth daily.   Ozempic (1 MG/DOSE) 4 MG/3ML Sopn Generic drug: Semaglutide (1 MG/DOSE) Inject 1 mg into the skin once a week.   pioglitazone 45 MG tablet Commonly known as: ACTOS TAKE 1 TABLET BY MOUTH EVERY DAY   pravastatin 20 MG tablet Commonly known as: PRAVACHOL Take 1 tablet by mouth once daily   tiZANidine 4 MG tablet Commonly known as: ZANAFLEX TAKE 1 TABLET BY MOUTH AT BEDTIME AS NEEDED FOR MUSCLE SPASMS.         OBJECTIVE:   Vital Signs:BP 120/80 (BP Location: Left Arm, Patient Position: Sitting, Cuff Size: Small)   Pulse 94   Ht $R'5\' 7"'xP$  (1.702 m)   Wt 223 lb (101.2 kg)   SpO2 (!) 76%   BMI 34.93 kg/m   Wt Readings from Last 3 Encounters:  03/17/22 223 lb (101.2 kg)  03/12/22 225 lb 2 oz (102.1 kg)  02/26/22 226 lb (102.5 kg)     Exam: General: Pt appears well and is in NAD  Lungs: Clear with good BS bilat with no rales, rhonchi, or wheezes  Heart: RRR with normal S1 and S2 and no gallops; no murmurs; no  rub  Abdomen: Normoactive bowel sounds, soft, nontender, without masses or organomegaly palpable  Extremities: No pretibial edema.   Neuro: MS is good with appropriate affect, pt is alert and Ox3    DM foot exam: 03/17/2022 The skin of the feet is intact without sores or ulcerations. The pedal pulses are 1+ on right and 1+ on left. The sensation is intact to a screening 5.07, 10 gram monofilament  bilaterally    DATA REVIEWED:  Lab Results  Component Value Date   HGBA1C 6.6 (A) 03/17/2022   HGBA1C 6.6 (A) 09/19/2021   HGBA1C 7.1 (H) 04/10/2021   Lab Results  Component Value Date   MICROALBUR 1.1 04/10/2021   LDLCALC 69 04/02/2020   CREATININE 0.86 04/10/2021   Lab Results  Component Value Date   MICRALBCREAT 2.6 04/10/2021     Lab Results  Component Value Date   CHOL 169 04/10/2021   HDL 41.40 04/10/2021   LDLCALC 69 04/02/2020   LDLDIRECT 99.0 04/10/2021   TRIG 256.0 (H) 04/10/2021   CHOLHDL 4 04/10/2021       In office BG 153 mg/dL   ASSESSMENT / PLAN / RECOMMENDATIONS:   1) Type 2 Diabetes Mellitus, Optimally controlled, With Neuropathic complications - Most recent A1c of 6.6 %. Goal A1c < 7.0 %.     -He is intolerant to metformin -He is tolerating Ozempic without side effect, will continue  -We again emphasized the importance of low-carb diet    MEDICATIONS:  - Continue  Ozempic 1 mg weekly - Continue Farxiga 10 mg tablets - Continue Pioglitazone 45 mg daily   EDUCATION / INSTRUCTIONS: BG monitoring instructions: Patient is instructed to check his blood sugars 1 times a day. Call [facility] if: BG persistently < 70  I reviewed the Rule of 15 for the treatment of hypoglycemia in detail with the patient. Literature supplied.    F/U in 6 months   Signed electronically by: Mack Guise, MD  [facility] Endocr[facility]  [facility] [address] [place], [place] [address] Phone:  [phone] FAX: [phone]   CC: Owens Loffler, [place] [address] Phone: [phone]  Fax: [phone]  Return to Endocrinology clinic as below: Future Appointments  Date Time Provider [place]  03/17/2022  8:50 AM Fareedah Mahler, Melanie Crazier, MD [facility] None  04/10/2022  8:30 AM [facility]  04/17/2022  8:20 AM Copland, Frederico Hamman, MD [facility]

## 2022-03-19 ENCOUNTER — Encounter: Payer: Self-pay | Admitting: Family Medicine

## 2022-03-19 ENCOUNTER — Ambulatory Visit (INDEPENDENT_AMBULATORY_CARE_PROVIDER_SITE_OTHER): Payer: BC Managed Care – PPO | Admitting: Family Medicine

## 2022-03-19 VITALS — BP 100/68 | HR 82 | Temp 98.1°F | Ht 67.0 in | Wt 228.0 lb

## 2022-03-19 DIAGNOSIS — J0100 Acute maxillary sinusitis, unspecified: Secondary | ICD-10-CM | POA: Diagnosis not present

## 2022-03-19 DIAGNOSIS — J4521 Mild intermittent asthma with (acute) exacerbation: Secondary | ICD-10-CM | POA: Diagnosis not present

## 2022-03-19 DIAGNOSIS — R062 Wheezing: Secondary | ICD-10-CM

## 2022-03-19 MED ORDER — AMOXICILLIN-POT CLAVULANATE 875-125 MG PO TABS: 1.0000 | ORAL_TABLET | Freq: Two times a day (BID) | ORAL | 0 refills | Status: DC

## 2022-03-19 MED ORDER — PREDNISONE 20 MG PO TABS: ORAL_TABLET | ORAL | 0 refills | Status: DC

## 2022-03-19 NOTE — Progress Notes (Signed)
Lonnie Jones T. Lonnie Pajak, MD, Lonnie Jones at [facility] Lonnie Jones [place], [address]  Phone: [phone]  FAX: [place] - 46 y.o. male  MRN [id number]  Date of Birth: 08/21/1975  Date: 03/19/2022  PCP: Lonnie Loffler, MD  Referral: Lonnie Loffler, MD  Chief Complaint  Patient presents with   Follow-up    Seen Lonnie Jones on 03/12/22 and still not feeling better (sinus issues)   Subjective:   Lonnie Jones is a 46 y.o. very pleasant male patient with Body mass index is 35.71 kg/m. who presents with the following:  Very well-known, pleasant patient who presents today for an acute visit.  He was seen on March 12, 2022 by Lonnie Jones, at that Jones felt to have strep pharyngitis and he was started on amoxicillin 500 mg p.o. twice daily.  Since then he is symptoms have progressed and he is now developing primarily sinus congestion and pain, as well as sees develop some wheezing.  He does have a baseline history of asthma at baseline, and he does have a Ventolin inhaler that he uses on a as needed basis.  While he does have a new inhaler at home, he has been using 1 that he feels like is old and does not work as well.  Ears are popping, looks like blisters on tongue.  On Amox 500 mg bid.     Review of Systems is noted in the HPI, as appropriate  Objective:   BP 100/68   Pulse 82   Temp 98.1 F (36.7 C) (Oral)   Ht 5' 7"  (1.702 m)   Wt 228 lb (103.4 kg)   SpO2 97%   BMI 35.71 kg/m    Gen: WDWN, NAD; alert,appropriate and cooperative throughout exam  HEENT: Normocephalic and atraumatic. Throat clear, w/o exudate, no LAD, R TM clear, L TM - good landmarks, No fluid present. rhinnorhea.  Left frontal and maxillary sinuses: Tender, max Right frontal and maxillary sinuses: Tender, max  Neck: No ant or post LAD CV: RRR, No M/G/R Pulm: Breathing comfortably in no resp distress. Audible b wheezing, no  crackles Psych: full affect, pleasant   Laboratory and Imaging Data:  Assessment and Plan:     ICD-10-CM   1. Acute non-recurrent maxillary sinusitis  J01.00     2. Wheezing  R06.2     3. Mild intermittent asthma with exacerbation  J45.21      We will change antibiotics to Augmentin for sinusitis dosing.  He also is having some active wheezing while he is here in the office, he is going to use his new albuterol that he has at home, and I am also going to give him a course of some oral steroids.  Medication Management during today's office visit: Meds ordered this encounter  Medications   amoxicillin-clavulanate (AUGMENTIN) 875-125 MG tablet    Sig: Take 1 tablet by mouth 2 (two) times daily.    Dispense:  20 tablet    Refill:  0   predniSONE (DELTASONE) 20 MG tablet    Sig: 2 tabs po for 4 days, then 1 tab po for 4 days    Dispense:  12 tablet    Refill:  0   Medications Discontinued During This Encounter  Medication Reason   amoxicillin (AMOXIL) 500 MG capsule     Orders placed today for conditions managed today: No orders of the defined types were placed in this encounter.  Disposition: No follow-ups on file.  [employer] One speech-to-text software was used for transcription in this dictation.  Possible transcriptional errors can occur using Editor, commissioning.   Signed,  Lonnie Jones. Lonnie Goerke, MD   Outpatient Encounter Medications as of 03/19/2022  Medication Sig   albuterol (VENTOLIN HFA) 108 (90 Base) MCG/ACT inhaler Inhale 1-2 puffs into the lungs every 6 (six) hours as needed for wheezing or shortness of breath.   amoxicillin-clavulanate (AUGMENTIN) 875-125 MG tablet Take 1 tablet by mouth 2 (two) times daily.   azelastine (ASTELIN) 0.1 % nasal spray INHALE 2 SPRAYS IN EACH NOSTRIL TWICE A DAY   Blood Glucose Monitoring Suppl (CONTOUR NEXT EZ) w/Device KIT Check blood sugar 2 times daily as needed   Continuous Blood Gluc Sensor (DEXCOM G7 SENSOR) MISC 1  Device by Does not apply route as directed.   dapagliflozin propanediol (FARXIGA) 10 MG TABS tablet Take 1 tablet (10 mg total) by mouth daily.   desloratadine (CLARINEX) 5 MG tablet Take 5 mg by mouth daily.   eszopiclone (LUNESTA) 1 MG TABS tablet TAKE 1 TABLET BY MOUTH AT BEDTIME AS NEEDED FOR SLEEP. TAKE IMMEDIATELY BEFORE BEDTIME   fluticasone (FLONASE) 50 MCG/ACT nasal spray SPRAY 1 SPRAY INTO EACH NOSTRIL EVERY DAY   gabapentin (NEURONTIN) 400 MG capsule TAKE 1 CAPSULE BY MOUTH 2 TIMES DAILY.   glucose blood (CONTOUR NEXT TEST) test strip Check blood sugar 2 times daily as needed   levocetirizine (XYZAL) 5 MG tablet Take 5 mg by mouth every evening.   meloxicam (MOBIC) 15 MG tablet TAKE 1 TABLET (15 MG TOTAL) BY MOUTH DAILY.   montelukast (SINGULAIR) 10 MG tablet Take 1 tablet (10 mg total) by mouth daily.   pioglitazone (ACTOS) 45 MG tablet Take 1 tablet (45 mg total) by mouth daily.   pravastatin (PRAVACHOL) 20 MG tablet Take 1 tablet by mouth once daily   predniSONE (DELTASONE) 20 MG tablet 2 tabs po for 4 days, then 1 tab po for 4 days   Semaglutide, 1 MG/DOSE, (OZEMPIC, 1 MG/DOSE,) 4 MG/3ML SOPN Inject 1 mg into the skin once a week.   tiZANidine (ZANAFLEX) 4 MG tablet TAKE 1 TABLET BY MOUTH AT BEDTIME AS NEEDED FOR MUSCLE SPASMS.   [DISCONTINUED] amoxicillin (AMOXIL) 500 MG capsule Take 1 capsule (500 mg total) by mouth 2 (two) times daily for 10 days.   No facility-administered encounter medications on file as of 03/19/2022.

## 2022-04-01 ENCOUNTER — Other Ambulatory Visit: Payer: Self-pay | Admitting: Family Medicine

## 2022-04-01 DIAGNOSIS — E1142 Type 2 diabetes mellitus with diabetic polyneuropathy: Secondary | ICD-10-CM

## 2022-04-01 DIAGNOSIS — Z125 Encounter for screening for malignant neoplasm of prostate: Secondary | ICD-10-CM

## 2022-04-01 DIAGNOSIS — E785 Hyperlipidemia, unspecified: Secondary | ICD-10-CM

## 2022-04-01 DIAGNOSIS — Z79899 Other long term (current) drug therapy: Secondary | ICD-10-CM

## 2022-04-10 ENCOUNTER — Other Ambulatory Visit (INDEPENDENT_AMBULATORY_CARE_PROVIDER_SITE_OTHER): Payer: BC Managed Care – PPO

## 2022-04-10 DIAGNOSIS — Z79899 Other long term (current) drug therapy: Secondary | ICD-10-CM

## 2022-04-10 DIAGNOSIS — E785 Hyperlipidemia, unspecified: Secondary | ICD-10-CM

## 2022-04-10 DIAGNOSIS — Z125 Encounter for screening for malignant neoplasm of prostate: Secondary | ICD-10-CM | POA: Diagnosis not present

## 2022-04-10 DIAGNOSIS — E1142 Type 2 diabetes mellitus with diabetic polyneuropathy: Secondary | ICD-10-CM

## 2022-04-10 LAB — LIPID PANEL
Cholesterol: 143 mg/dL (ref 0–200)
HDL: 45.3 mg/dL
LDL Cholesterol: 73 mg/dL (ref 0–99)
NonHDL: 97.74
Total CHOL/HDL Ratio: 3
Triglycerides: 123 mg/dL (ref 0.0–149.0)
VLDL: 24.6 mg/dL (ref 0.0–40.0)

## 2022-04-10 LAB — CBC WITH DIFFERENTIAL/PLATELET
Basophils Absolute: 0 10*3/uL (ref 0.0–0.1)
Basophils Relative: 0.4 % (ref 0.0–3.0)
Eosinophils Absolute: 0 10*3/uL (ref 0.0–0.7)
Eosinophils Relative: 1.1 % (ref 0.0–5.0)
HCT: 50.2 % (ref 39.0–52.0)
Hemoglobin: 17.2 g/dL — ABNORMAL HIGH (ref 13.0–17.0)
Lymphocytes Relative: 29.7 % (ref 12.0–46.0)
Lymphs Abs: 1.1 10*3/uL (ref 0.7–4.0)
MCHC: 34.3 g/dL (ref 30.0–36.0)
MCV: 91.4 fl (ref 78.0–100.0)
Monocytes Absolute: 0.3 10*3/uL (ref 0.1–1.0)
Monocytes Relative: 8.3 % (ref 3.0–12.0)
Neutro Abs: 2.2 10*3/uL (ref 1.4–7.7)
Neutrophils Relative %: 60.5 % (ref 43.0–77.0)
Platelets: 170 10*3/uL (ref 150.0–400.0)
RBC: 5.49 Mil/uL (ref 4.22–5.81)
RDW: 12.6 % (ref 11.5–15.5)
WBC: 3.6 10*3/uL — ABNORMAL LOW (ref 4.0–10.5)

## 2022-04-10 LAB — BASIC METABOLIC PANEL
BUN: 21 mg/dL (ref 6–23)
CO2: 30 mEq/L (ref 19–32)
Calcium: 9.3 mg/dL (ref 8.4–10.5)
Chloride: 102 mEq/L (ref 96–112)
Creatinine, Ser: 0.7 mg/dL (ref 0.40–1.50)
GFR: 110.98 mL/min (ref >60.00)
Glucose, Bld: 138 mg/dL — ABNORMAL HIGH (ref 70–99)
Potassium: 5 mEq/L (ref 3.5–5.1)
Sodium: 138 mEq/L (ref 135–145)

## 2022-04-10 LAB — HEPATIC FUNCTION PANEL
ALT: 29 U/L (ref 0–53)
AST: 15 U/L (ref 0–37)
Albumin: 4.4 g/dL (ref 3.5–5.2)
Alkaline Phosphatase: 51 U/L (ref 39–117)
Bilirubin, Direct: 0.2 mg/dL (ref 0.0–0.3)
Total Bilirubin: 0.9 mg/dL (ref 0.2–1.2)
Total Protein: 6.5 g/dL (ref 6.0–8.3)

## 2022-04-10 LAB — MICROALBUMIN / CREATININE URINE RATIO
Creatinine,U: 64.5 mg/dL
Microalb Creat Ratio: 1.8 mg/g (ref 0.0–30.0)
Microalb, Ur: 1.2 mg/dL (ref 0.0–1.9)

## 2022-04-10 LAB — HEMOGLOBIN A1C: Hgb A1c MFr Bld: 6.8 % — ABNORMAL HIGH (ref 4.6–6.5)

## 2022-04-14 LAB — PSA, TOTAL WITH REFLEX TO PSA, FREE: PSA, Total: 0.8 ng/mL (ref <=4.0)

## 2022-04-16 ENCOUNTER — Encounter: Payer: Self-pay | Admitting: Family Medicine

## 2022-04-16 NOTE — Progress Notes (Unsigned)
Lonnie Koren T. Armanie Martine, MD, [place] at Lonnie Jones [place], [address]  Phone: [phone]  FAX: [place] - 46 y.o. male  MRN [id number]  Date of Birth: [birth year]  Date: 04/17/2022  PCP: Owens Loffler, MD  Referral: Owens Loffler, MD  No chief complaint on file.  Patient Care Team: Owens Loffler, MD as PCP - General (Family Medicine) Subjective:   Lonnie Jones is a 46 y.o. pleasant patient who presents with the following:  Preventative Health Maintenance Visit:  Health Maintenance Summary Reviewed and updated, unless pt declines services.  Tobacco History Reviewed. Alcohol: No concerns, no excessive use Exercise Habits: Some activity, rec at least 30 mins 5 times a week STD concerns: no risk or activity to increase risk Drug Use: None  Well-known gentleman who presents with general health maintenance exam.  Eye exam COVID booster  Does have diabetes, managed by endocrinology.  Health Maintenance  Topic Date Due   COVID-19 Vaccine (3 - Pfizer risk series) 10/17/2019   OPHTHALMOLOGY EXAM  02/13/2021   INFLUENZA VACCINE  08/31/2022 (Originally 12/31/2021)   TETANUS/TDAP  07/28/2022   HEMOGLOBIN A1C  10/09/2022   FOOT EXAM  03/18/2023   Diabetic kidney evaluation - GFR measurement  04/11/2023   Diabetic kidney evaluation - Urine ACR  04/11/2023   COLONOSCOPY (Pts 45-36yr Insurance coverage will need to be confirmed)  02/26/2025   Hepatitis C Screening  Completed   HIV Screening  Completed   HPV VACCINES  Aged Out   Immunization History  Administered Date(s) Administered   Influenza, High Dose Seasonal PF 09/27/2019, 08/28/2020   Influenza, Seasonal, Injecte, Preservative Fre 07/28/2012   Influenza,inj,Quad PF,6+ Mos 01/26/2013, 02/15/2015, 05/12/2016, 02/24/2018, 02/11/2019, 04/02/2020, 04/10/2021   PFIZER(Purple Top)SARS-COV-2 Vaccination 08/26/2019, 09/19/2019    PNEUMOCOCCAL CONJUGATE-20 04/10/2021   Pneumococcal Polysaccharide-23 07/28/2012, 08/24/2017, 05/07/2021   Tdap 07/28/2012   Patient Active Problem List   Diagnosis Date Noted   Type 2 diabetes mellitus with peripheral neuropathy ([place] 08/08/2010    Priority: High   Peripheral autonomic neuropathy due to diabetes mellitus ([facility] 02/16/2015    Priority: Medium    Hyperlipidemia with target LDL less than 70 12/16/2010    Priority: Medium    Allergic rhinitis due to allergen 08/25/2011    Priority: Low   MIGRAINE HEADACHE 08/08/2010    Priority: Low   ASTHMA 08/08/2010    Past Medical History:  Diagnosis Date   Acute idiopathic gout involving toe 06/22/2017   Allergic rhinitis due to allergen 08/25/2011   Asthma    GERD (gastroesophageal reflux disease)    Hyperlipidemia LDL goal < 70 12/16/2010   Migraine headache    Type 2 diabetes mellitus with peripheral neuropathy ([facility] 08/08/2010   Qualifier: Diagnosis of  By: CLorelei PontMD, SFrederico Hamman     Past Surgical History:  Procedure Laterality Date   ANKLE SURGERY     APPENDECTOMY  06/02/1998   CARPAL TUNNEL RELEASE     GANGLION CYST EXCISION     KNEE ARTHROSCOPY W/ ACL RECONSTRUCTION AND PATELLA GRAFT     KNEE SURGERY  06/02/2009   SHOULDER SURGERY Left     Family History  Problem Relation Age of Onset   Healthy Mother    Healthy Father    Colon cancer Neg Hx    Colon polyps Neg Hx    Esophageal cancer Neg Hx    Stomach cancer Neg Hx    Rectal cancer  Neg Hx     Social History   Social History Narrative   Not on file    Past Medical History, Surgical History, Social History, Family History, Problem List, Medications, and Allergies have been reviewed and updated if relevant.  Review of Systems: Pertinent positives are listed above.  Otherwise, a full 14 point review of systems has been done in full and it is negative except where it is noted positive.  Objective:   There were no vitals taken for this visit. Ideal Body  Weight:    Ideal Body Weight:   No results found.    06/27/2019    3:35 PM 01/19/2019    8:16 AM 08/24/2017    8:33 AM 12/04/2016    8:27 AM  Depression screen PHQ 2/9  Decreased Interest 0 0 0 0  Down, Depressed, Hopeless 0 0 0 0  PHQ - 2 Score 0 0 0 0     GEN: well developed, well nourished, no acute distress Eyes: conjunctiva and lids normal, PERRLA, EOMI ENT: TM clear, nares clear, oral exam WNL Neck: supple, no lymphadenopathy, no thyromegaly, no JVD Pulm: clear to auscultation and percussion, respiratory effort normal CV: regular rate and rhythm, S1-S2, no murmur, rub or gallop, no bruits, peripheral pulses normal and symmetric, no cyanosis, clubbing, edema or varicosities GI: soft, non-tender; no hepatosplenomegaly, masses; active bowel sounds all quadrants GU: deferred Lymph: no cervical, axillary or inguinal adenopathy MSK: gait normal, muscle tone and strength WNL, no joint swelling, effusions, discoloration, crepitus  SKIN: clear, good turgor, color WNL, no rashes, lesions, or ulcerations Neuro: normal mental status, normal strength, sensation, and motion Psych: alert; oriented to person, place and time, normally interactive and not anxious or depressed in appearance.  All labs reviewed with patient. Results for orders placed or performed in visit on 04/10/22  PSA, Total with Reflex to PSA, Free  Result Value Ref Range   PSA, Total 0.8 < OR = 4.0 ng/mL  Hemoglobin A1c  Result Value Ref Range   Hgb A1c MFr Bld 6.8 (H) 4.6 - 6.5 %  Microalbumin / creatinine urine ratio  Result Value Ref Range   Microalb, Ur 1.2 0.0 - 1.9 mg/dL   Creatinine,U 64.5 mg/dL   Microalb Creat Ratio 1.8 0.0 - 30.0 mg/g  CBC with Differential/Platelet  Result Value Ref Range   WBC 3.6 (L) 4.0 - 10.5 K/uL   RBC 5.49 4.22 - 5.81 Mil/uL   Hemoglobin 17.2 (H) 13.0 - 17.0 g/dL   HCT 50.2 39.0 - 52.0 %   MCV 91.4 78.0 - 100.0 fl   MCHC 34.3 30.0 - 36.0 g/dL   RDW 12.6 11.5 - 15.5 %    Platelets 170.0 150.0 - 400.0 K/uL   Neutrophils Relative % 60.5 43.0 - 77.0 %   Lymphocytes Relative 29.7 12.0 - 46.0 %   Monocytes Relative 8.3 3.0 - 12.0 %   Eosinophils Relative 1.1 0.0 - 5.0 %   Basophils Relative 0.4 0.0 - 3.0 %   Neutro Abs 2.2 1.4 - 7.7 K/uL   Lymphs Abs 1.1 0.7 - 4.0 K/uL   Monocytes Absolute 0.3 0.1 - 1.0 K/uL   Eosinophils Absolute 0.0 0.0 - 0.7 K/uL   Basophils Absolute 0.0 0.0 - 0.1 K/uL  Basic metabolic panel  Result Value Ref Range   Sodium 138 135 - 145 mEq/L   Potassium 5.0 3.5 - 5.1 mEq/L   Chloride 102 96 - 112 mEq/L   CO2 30 19 - 32 mEq/L  Glucose, Bld 138 (H) 70 - 99 mg/dL   BUN 21 6 - 23 mg/dL   Creatinine, Ser 0.70 0.40 - 1.50 mg/dL   GFR 110.98 >60.00 mL/min   Calcium 9.3 8.4 - 10.5 mg/dL  Hepatic function panel  Result Value Ref Range   Total Bilirubin 0.9 0.2 - 1.2 mg/dL   Bilirubin, Direct 0.2 0.0 - 0.3 mg/dL   Alkaline Phosphatase 51 39 - 117 U/L   AST 15 0 - 37 U/L   ALT 29 0 - 53 U/L   Total Protein 6.5 6.0 - 8.3 g/dL   Albumin 4.4 3.5 - 5.2 g/dL  Lipid panel  Result Value Ref Range   Cholesterol 143 0 - 200 mg/dL   Triglycerides 123.0 0.0 - 149.0 mg/dL   HDL 45.30 >39.00 mg/dL   VLDL 24.6 0.0 - 40.0 mg/dL   LDL Cholesterol 73 0 - 99 mg/dL   Total CHOL/HDL Ratio 3    NonHDL 97.74     Assessment and Plan:     ICD-10-CM   1. Healthcare maintenance  Z00.00       Health Maintenance Exam: The patient's preventative maintenance and recommended screening tests for an annual wellness exam were reviewed in full today. Brought up to date unless services declined.  Counselled on the importance of diet, exercise, and its role in overall health and mortality. The patient's FH and SH was reviewed, including their home life, tobacco status, and drug and alcohol status.  Follow-up in 1 year for physical exam or additional follow-up below.  Disposition: No follow-ups on file.  No orders of the defined types were placed in this  encounter.  Medications Discontinued During This Encounter  Medication Reason   predniSONE (DELTASONE) 20 MG tablet    amoxicillin-clavulanate (AUGMENTIN) 875-125 MG tablet    No orders of the defined types were placed in this encounter.   Signed,  Maud Deed. Sylvester Salonga, MD   Allergies as of 04/17/2022       Reactions   Codeine Itching        Medication List        Accurate as of April 16, 2022  8:06 AM. If you have any questions, ask your nurse or doctor.          STOP taking these medications    amoxicillin-clavulanate 875-125 MG tablet Commonly known as: AUGMENTIN   predniSONE 20 MG tablet Commonly known as: DELTASONE       TAKE these medications    albuterol 108 (90 Base) MCG/ACT inhaler Commonly known as: VENTOLIN HFA Inhale 1-2 puffs into the lungs every 6 (six) hours as needed for wheezing or shortness of breath.   azelastine 0.1 % nasal spray Commonly known as: ASTELIN INHALE 2 SPRAYS IN EACH NOSTRIL TWICE A DAY   Contour Next EZ w/Device Kit Check blood sugar 2 times daily as needed   Contour Next Test test strip Generic drug: glucose blood Check blood sugar 2 times daily as needed   dapagliflozin propanediol 10 MG Tabs tablet Commonly known as: Farxiga Take 1 tablet (10 mg total) by mouth daily.   desloratadine 5 MG tablet Commonly known as: CLARINEX Take 5 mg by mouth daily.   Dexcom G7 Sensor Misc 1 Device by Does not apply route as directed.   eszopiclone 1 MG Tabs tablet Commonly known as: LUNESTA TAKE 1 TABLET BY MOUTH AT BEDTIME AS NEEDED FOR SLEEP. TAKE IMMEDIATELY BEFORE BEDTIME   fluticasone 50 MCG/ACT nasal spray Commonly known as: FLONASE  SPRAY 1 SPRAY INTO EACH NOSTRIL EVERY DAY   gabapentin 400 MG capsule Commonly known as: NEURONTIN TAKE 1 CAPSULE BY MOUTH 2 TIMES DAILY.   levocetirizine 5 MG tablet Commonly known as: XYZAL Take 5 mg by mouth every evening.   meloxicam 15 MG tablet Commonly known as:  MOBIC TAKE 1 TABLET (15 MG TOTAL) BY MOUTH DAILY.   montelukast 10 MG tablet Commonly known as: SINGULAIR Take 1 tablet (10 mg total) by mouth daily.   Ozempic (1 MG/DOSE) 4 MG/3ML Sopn Generic drug: Semaglutide (1 MG/DOSE) Inject 1 mg into the skin once a week.   pioglitazone 45 MG tablet Commonly known as: ACTOS Take 1 tablet (45 mg total) by mouth daily.   pravastatin 20 MG tablet Commonly known as: PRAVACHOL Take 1 tablet by mouth once daily   tiZANidine 4 MG tablet Commonly known as: ZANAFLEX TAKE 1 TABLET BY MOUTH AT BEDTIME AS NEEDED FOR MUSCLE SPASMS.

## 2022-04-17 ENCOUNTER — Ambulatory Visit (INDEPENDENT_AMBULATORY_CARE_PROVIDER_SITE_OTHER): Payer: BC Managed Care – PPO | Admitting: Family Medicine

## 2022-04-17 ENCOUNTER — Encounter: Payer: Self-pay | Admitting: Family Medicine

## 2022-04-17 VITALS — BP 100/64 | HR 90 | Temp 98.0°F | Ht 66.75 in | Wt 224.2 lb

## 2022-04-17 DIAGNOSIS — Z0000 Encounter for general adult medical examination without abnormal findings: Secondary | ICD-10-CM | POA: Diagnosis not present

## 2022-04-17 DIAGNOSIS — Z23 Encounter for immunization: Secondary | ICD-10-CM | POA: Diagnosis not present

## 2022-04-30 DIAGNOSIS — M75121 Complete rotator cuff tear or rupture of right shoulder, not specified as traumatic: Secondary | ICD-10-CM | POA: Diagnosis not present

## 2022-04-30 DIAGNOSIS — M7521 Bicipital tendinitis, right shoulder: Secondary | ICD-10-CM | POA: Diagnosis not present

## 2022-04-30 DIAGNOSIS — S46011A Strain of muscle(s) and tendon(s) of the rotator cuff of right shoulder, initial encounter: Secondary | ICD-10-CM | POA: Diagnosis not present

## 2022-04-30 DIAGNOSIS — M948X1 Other specified disorders of cartilage, shoulder: Secondary | ICD-10-CM | POA: Diagnosis not present

## 2022-04-30 DIAGNOSIS — M7541 Impingement syndrome of right shoulder: Secondary | ICD-10-CM | POA: Diagnosis not present

## 2022-04-30 DIAGNOSIS — M19011 Primary osteoarthritis, right shoulder: Secondary | ICD-10-CM | POA: Diagnosis not present

## 2022-04-30 DIAGNOSIS — M7551 Bursitis of right shoulder: Secondary | ICD-10-CM | POA: Diagnosis not present

## 2022-04-30 DIAGNOSIS — M24111 Other articular cartilage disorders, right shoulder: Secondary | ICD-10-CM | POA: Diagnosis not present

## 2022-04-30 DIAGNOSIS — G8918 Other acute postprocedural pain: Secondary | ICD-10-CM | POA: Diagnosis not present

## 2022-05-06 DIAGNOSIS — M19011 Primary osteoarthritis, right shoulder: Secondary | ICD-10-CM | POA: Diagnosis not present

## 2022-05-07 ENCOUNTER — Other Ambulatory Visit: Payer: Self-pay | Admitting: Family Medicine

## 2022-05-07 NOTE — Telephone Encounter (Signed)
Last office visit 04/17/22 for CPE.  Last refilled 02/07/21 for #90 with 3 refills.  No future appointments with PCP.

## 2022-05-08 DIAGNOSIS — E119 Type 2 diabetes mellitus without complications: Secondary | ICD-10-CM | POA: Diagnosis not present

## 2022-05-12 ENCOUNTER — Encounter: Payer: Self-pay | Admitting: Family Medicine

## 2022-05-12 MED ORDER — TIZANIDINE HCL 4 MG PO TABS: ORAL_TABLET | ORAL | 3 refills | Status: DC

## 2022-05-12 NOTE — Telephone Encounter (Signed)
Last office visit 04/17/22 for CPE.  Last refilled 01/24/2021 for #90 with 3 refills.  No future appointments with PCP.

## 2022-05-28 DIAGNOSIS — M25611 Stiffness of right shoulder, not elsewhere classified: Secondary | ICD-10-CM | POA: Diagnosis not present

## 2022-05-28 DIAGNOSIS — M6281 Muscle weakness (generalized): Secondary | ICD-10-CM | POA: Diagnosis not present

## 2022-05-28 DIAGNOSIS — S46011D Strain of muscle(s) and tendon(s) of the rotator cuff of right shoulder, subsequent encounter: Secondary | ICD-10-CM | POA: Diagnosis not present

## 2022-05-28 DIAGNOSIS — S46111D Strain of muscle, fascia and tendon of long head of biceps, right arm, subsequent encounter: Secondary | ICD-10-CM | POA: Diagnosis not present

## 2022-06-04 ENCOUNTER — Other Ambulatory Visit: Payer: Self-pay | Admitting: Internal Medicine

## 2022-06-04 DIAGNOSIS — S46111D Strain of muscle, fascia and tendon of long head of biceps, right arm, subsequent encounter: Secondary | ICD-10-CM | POA: Diagnosis not present

## 2022-06-04 DIAGNOSIS — M25611 Stiffness of right shoulder, not elsewhere classified: Secondary | ICD-10-CM | POA: Diagnosis not present

## 2022-06-04 DIAGNOSIS — M6281 Muscle weakness (generalized): Secondary | ICD-10-CM | POA: Diagnosis not present

## 2022-06-04 DIAGNOSIS — S46011D Strain of muscle(s) and tendon(s) of the rotator cuff of right shoulder, subsequent encounter: Secondary | ICD-10-CM | POA: Diagnosis not present

## 2022-06-06 DIAGNOSIS — S46111D Strain of muscle, fascia and tendon of long head of biceps, right arm, subsequent encounter: Secondary | ICD-10-CM | POA: Diagnosis not present

## 2022-06-06 DIAGNOSIS — M25611 Stiffness of right shoulder, not elsewhere classified: Secondary | ICD-10-CM | POA: Diagnosis not present

## 2022-06-06 DIAGNOSIS — S46011D Strain of muscle(s) and tendon(s) of the rotator cuff of right shoulder, subsequent encounter: Secondary | ICD-10-CM | POA: Diagnosis not present

## 2022-06-06 DIAGNOSIS — M6281 Muscle weakness (generalized): Secondary | ICD-10-CM | POA: Diagnosis not present

## 2022-06-09 DIAGNOSIS — S46011D Strain of muscle(s) and tendon(s) of the rotator cuff of right shoulder, subsequent encounter: Secondary | ICD-10-CM | POA: Diagnosis not present

## 2022-06-09 DIAGNOSIS — S46111D Strain of muscle, fascia and tendon of long head of biceps, right arm, subsequent encounter: Secondary | ICD-10-CM | POA: Diagnosis not present

## 2022-06-09 DIAGNOSIS — M25611 Stiffness of right shoulder, not elsewhere classified: Secondary | ICD-10-CM | POA: Diagnosis not present

## 2022-06-09 DIAGNOSIS — M6281 Muscle weakness (generalized): Secondary | ICD-10-CM | POA: Diagnosis not present

## 2022-06-10 ENCOUNTER — Other Ambulatory Visit: Payer: Self-pay | Admitting: Family Medicine

## 2022-06-11 NOTE — Telephone Encounter (Signed)
Last office visit 04/17/22 for CPE.  Last refilled 02/10/2022 for #30 with 3 refills.  No future appointments.

## 2022-06-12 DIAGNOSIS — S46111D Strain of muscle, fascia and tendon of long head of biceps, right arm, subsequent encounter: Secondary | ICD-10-CM | POA: Diagnosis not present

## 2022-06-12 DIAGNOSIS — S46011D Strain of muscle(s) and tendon(s) of the rotator cuff of right shoulder, subsequent encounter: Secondary | ICD-10-CM | POA: Diagnosis not present

## 2022-06-12 DIAGNOSIS — M25611 Stiffness of right shoulder, not elsewhere classified: Secondary | ICD-10-CM | POA: Diagnosis not present

## 2022-06-12 DIAGNOSIS — M6281 Muscle weakness (generalized): Secondary | ICD-10-CM | POA: Diagnosis not present

## 2022-06-17 DIAGNOSIS — M6281 Muscle weakness (generalized): Secondary | ICD-10-CM | POA: Diagnosis not present

## 2022-06-17 DIAGNOSIS — M7541 Impingement syndrome of right shoulder: Secondary | ICD-10-CM | POA: Diagnosis not present

## 2022-06-17 DIAGNOSIS — S46011D Strain of muscle(s) and tendon(s) of the rotator cuff of right shoulder, subsequent encounter: Secondary | ICD-10-CM | POA: Diagnosis not present

## 2022-06-17 DIAGNOSIS — S46111D Strain of muscle, fascia and tendon of long head of biceps, right arm, subsequent encounter: Secondary | ICD-10-CM | POA: Diagnosis not present

## 2022-06-17 DIAGNOSIS — M25611 Stiffness of right shoulder, not elsewhere classified: Secondary | ICD-10-CM | POA: Diagnosis not present

## 2022-06-19 DIAGNOSIS — S46011D Strain of muscle(s) and tendon(s) of the rotator cuff of right shoulder, subsequent encounter: Secondary | ICD-10-CM | POA: Diagnosis not present

## 2022-06-19 DIAGNOSIS — M25611 Stiffness of right shoulder, not elsewhere classified: Secondary | ICD-10-CM | POA: Diagnosis not present

## 2022-06-19 DIAGNOSIS — M6281 Muscle weakness (generalized): Secondary | ICD-10-CM | POA: Diagnosis not present

## 2022-06-19 DIAGNOSIS — S46111D Strain of muscle, fascia and tendon of long head of biceps, right arm, subsequent encounter: Secondary | ICD-10-CM | POA: Diagnosis not present

## 2022-06-24 DIAGNOSIS — M6281 Muscle weakness (generalized): Secondary | ICD-10-CM | POA: Diagnosis not present

## 2022-06-24 DIAGNOSIS — M25611 Stiffness of right shoulder, not elsewhere classified: Secondary | ICD-10-CM | POA: Diagnosis not present

## 2022-06-24 DIAGNOSIS — S46011D Strain of muscle(s) and tendon(s) of the rotator cuff of right shoulder, subsequent encounter: Secondary | ICD-10-CM | POA: Diagnosis not present

## 2022-06-24 DIAGNOSIS — S46111D Strain of muscle, fascia and tendon of long head of biceps, right arm, subsequent encounter: Secondary | ICD-10-CM | POA: Diagnosis not present

## 2022-06-26 DIAGNOSIS — M25611 Stiffness of right shoulder, not elsewhere classified: Secondary | ICD-10-CM | POA: Diagnosis not present

## 2022-06-26 DIAGNOSIS — S46111D Strain of muscle, fascia and tendon of long head of biceps, right arm, subsequent encounter: Secondary | ICD-10-CM | POA: Diagnosis not present

## 2022-06-26 DIAGNOSIS — S46011D Strain of muscle(s) and tendon(s) of the rotator cuff of right shoulder, subsequent encounter: Secondary | ICD-10-CM | POA: Diagnosis not present

## 2022-06-26 DIAGNOSIS — M6281 Muscle weakness (generalized): Secondary | ICD-10-CM | POA: Diagnosis not present

## 2022-07-01 DIAGNOSIS — S46011D Strain of muscle(s) and tendon(s) of the rotator cuff of right shoulder, subsequent encounter: Secondary | ICD-10-CM | POA: Diagnosis not present

## 2022-07-01 DIAGNOSIS — M25611 Stiffness of right shoulder, not elsewhere classified: Secondary | ICD-10-CM | POA: Diagnosis not present

## 2022-07-01 DIAGNOSIS — S46111D Strain of muscle, fascia and tendon of long head of biceps, right arm, subsequent encounter: Secondary | ICD-10-CM | POA: Diagnosis not present

## 2022-07-01 DIAGNOSIS — M6281 Muscle weakness (generalized): Secondary | ICD-10-CM | POA: Diagnosis not present

## 2022-07-03 DIAGNOSIS — M6281 Muscle weakness (generalized): Secondary | ICD-10-CM | POA: Diagnosis not present

## 2022-07-03 DIAGNOSIS — M25611 Stiffness of right shoulder, not elsewhere classified: Secondary | ICD-10-CM | POA: Diagnosis not present

## 2022-07-03 DIAGNOSIS — S46011D Strain of muscle(s) and tendon(s) of the rotator cuff of right shoulder, subsequent encounter: Secondary | ICD-10-CM | POA: Diagnosis not present

## 2022-07-03 DIAGNOSIS — S46111D Strain of muscle, fascia and tendon of long head of biceps, right arm, subsequent encounter: Secondary | ICD-10-CM | POA: Diagnosis not present

## 2022-07-08 DIAGNOSIS — M25611 Stiffness of right shoulder, not elsewhere classified: Secondary | ICD-10-CM | POA: Diagnosis not present

## 2022-07-08 DIAGNOSIS — S46011D Strain of muscle(s) and tendon(s) of the rotator cuff of right shoulder, subsequent encounter: Secondary | ICD-10-CM | POA: Diagnosis not present

## 2022-07-08 DIAGNOSIS — M6281 Muscle weakness (generalized): Secondary | ICD-10-CM | POA: Diagnosis not present

## 2022-07-08 DIAGNOSIS — S46111D Strain of muscle, fascia and tendon of long head of biceps, right arm, subsequent encounter: Secondary | ICD-10-CM | POA: Diagnosis not present

## 2022-07-10 DIAGNOSIS — S46111D Strain of muscle, fascia and tendon of long head of biceps, right arm, subsequent encounter: Secondary | ICD-10-CM | POA: Diagnosis not present

## 2022-07-10 DIAGNOSIS — S46011D Strain of muscle(s) and tendon(s) of the rotator cuff of right shoulder, subsequent encounter: Secondary | ICD-10-CM | POA: Diagnosis not present

## 2022-07-10 DIAGNOSIS — M25611 Stiffness of right shoulder, not elsewhere classified: Secondary | ICD-10-CM | POA: Diagnosis not present

## 2022-07-10 DIAGNOSIS — M6281 Muscle weakness (generalized): Secondary | ICD-10-CM | POA: Diagnosis not present

## 2022-07-15 DIAGNOSIS — M25611 Stiffness of right shoulder, not elsewhere classified: Secondary | ICD-10-CM | POA: Diagnosis not present

## 2022-07-15 DIAGNOSIS — M6281 Muscle weakness (generalized): Secondary | ICD-10-CM | POA: Diagnosis not present

## 2022-07-15 DIAGNOSIS — S46011D Strain of muscle(s) and tendon(s) of the rotator cuff of right shoulder, subsequent encounter: Secondary | ICD-10-CM | POA: Diagnosis not present

## 2022-07-15 DIAGNOSIS — S46111D Strain of muscle, fascia and tendon of long head of biceps, right arm, subsequent encounter: Secondary | ICD-10-CM | POA: Diagnosis not present

## 2022-07-22 DIAGNOSIS — M25611 Stiffness of right shoulder, not elsewhere classified: Secondary | ICD-10-CM | POA: Diagnosis not present

## 2022-07-22 DIAGNOSIS — M6281 Muscle weakness (generalized): Secondary | ICD-10-CM | POA: Diagnosis not present

## 2022-07-22 DIAGNOSIS — S46011D Strain of muscle(s) and tendon(s) of the rotator cuff of right shoulder, subsequent encounter: Secondary | ICD-10-CM | POA: Diagnosis not present

## 2022-07-22 DIAGNOSIS — S46111D Strain of muscle, fascia and tendon of long head of biceps, right arm, subsequent encounter: Secondary | ICD-10-CM | POA: Diagnosis not present

## 2022-07-29 DIAGNOSIS — S46111D Strain of muscle, fascia and tendon of long head of biceps, right arm, subsequent encounter: Secondary | ICD-10-CM | POA: Diagnosis not present

## 2022-07-29 DIAGNOSIS — S46011D Strain of muscle(s) and tendon(s) of the rotator cuff of right shoulder, subsequent encounter: Secondary | ICD-10-CM | POA: Diagnosis not present

## 2022-07-29 DIAGNOSIS — M25611 Stiffness of right shoulder, not elsewhere classified: Secondary | ICD-10-CM | POA: Diagnosis not present

## 2022-07-29 DIAGNOSIS — M6281 Muscle weakness (generalized): Secondary | ICD-10-CM | POA: Diagnosis not present

## 2022-08-05 DIAGNOSIS — M6281 Muscle weakness (generalized): Secondary | ICD-10-CM | POA: Diagnosis not present

## 2022-08-05 DIAGNOSIS — M25611 Stiffness of right shoulder, not elsewhere classified: Secondary | ICD-10-CM | POA: Diagnosis not present

## 2022-08-05 DIAGNOSIS — S46111D Strain of muscle, fascia and tendon of long head of biceps, right arm, subsequent encounter: Secondary | ICD-10-CM | POA: Diagnosis not present

## 2022-08-05 DIAGNOSIS — S46011D Strain of muscle(s) and tendon(s) of the rotator cuff of right shoulder, subsequent encounter: Secondary | ICD-10-CM | POA: Diagnosis not present

## 2022-08-09 DIAGNOSIS — M25562 Pain in left knee: Secondary | ICD-10-CM | POA: Diagnosis not present

## 2022-08-11 DIAGNOSIS — M6281 Muscle weakness (generalized): Secondary | ICD-10-CM | POA: Diagnosis not present

## 2022-08-11 DIAGNOSIS — M25611 Stiffness of right shoulder, not elsewhere classified: Secondary | ICD-10-CM | POA: Diagnosis not present

## 2022-08-11 DIAGNOSIS — S46111D Strain of muscle, fascia and tendon of long head of biceps, right arm, subsequent encounter: Secondary | ICD-10-CM | POA: Diagnosis not present

## 2022-08-11 DIAGNOSIS — S46011D Strain of muscle(s) and tendon(s) of the rotator cuff of right shoulder, subsequent encounter: Secondary | ICD-10-CM | POA: Diagnosis not present

## 2022-08-28 DIAGNOSIS — M7541 Impingement syndrome of right shoulder: Secondary | ICD-10-CM | POA: Diagnosis not present

## 2022-09-04 ENCOUNTER — Other Ambulatory Visit: Payer: Self-pay | Admitting: Family Medicine

## 2022-09-04 DIAGNOSIS — M25562 Pain in left knee: Secondary | ICD-10-CM | POA: Diagnosis not present

## 2022-09-04 MED ORDER — GABAPENTIN 400 MG PO CAPS: 400.0000 mg | ORAL_CAPSULE | Freq: Two times a day (BID) | ORAL | 3 refills | Status: DC

## 2022-09-04 NOTE — Telephone Encounter (Signed)
Last office visit 04/17/22 for CPE.  Last refilled 05/06/2021 for #180 with 3 refills.  Next appt;  No future appointments with PCP.

## 2022-09-04 NOTE — Telephone Encounter (Signed)
Prescription Request  09/04/2022  LOV: 04/17/2022  What is the na me of the medication or equipment? gabapentin (NEURONTIN) 400 MG capsule   Have you contacted your pharmacy to request a refill? Yes   Which pharmacy would you like this sent to?   [place], [place] [address] Phone: [phone] Fax: [phone]    Patient notified that their request is being sent to the clinical staff for review and that they should receive a response within 2 business days.   Please advise at [facility] [phone]

## 2022-09-06 DIAGNOSIS — M25562 Pain in left knee: Secondary | ICD-10-CM | POA: Diagnosis not present

## 2022-09-18 ENCOUNTER — Encounter: Payer: Self-pay | Admitting: Internal Medicine

## 2022-09-18 ENCOUNTER — Ambulatory Visit: Payer: BC Managed Care – PPO | Admitting: Internal Medicine

## 2022-09-18 VITALS — BP 126/80 | HR 89 | Ht 66.75 in | Wt 228.0 lb

## 2022-09-18 DIAGNOSIS — E1142 Type 2 diabetes mellitus with diabetic polyneuropathy: Secondary | ICD-10-CM

## 2022-09-18 LAB — POCT GLYCOSYLATED HEMOGLOBIN (HGB A1C): Hemoglobin A1C: 6.7 % — AB (ref 4.0–5.6)

## 2022-09-18 MED ORDER — TIRZEPATIDE 5 MG/0.5ML ~~LOC~~ SOAJ: 5.0000 mg | SUBCUTANEOUS | 3 refills | Status: DC

## 2022-09-18 NOTE — Patient Instructions (Addendum)
-   Switch  Ozempic to Mounjaro 5 mg weekly  - Continue Farxiga 10 mg tablets - Continue Pioglitazone 45 mg daily        HOW TO TREAT LOW BLOOD SUGARS (Blood sugar LESS THAN 70 MG/DL) Please follow the RULE OF 15 for the treatment of hypoglycemia treatment (when your (blood sugars are less than 70 mg/dL)   STEP 1: Take 15 grams of carbohydrates when your blood sugar is low, which includes:  3-4 GLUCOSE TABS  OR 3-4 OZ OF JUICE OR REGULAR SODA OR ONE TUBE OF GLUCOSE GEL    STEP 2: RECHECK blood sugar in 15 MINUTES STEP 3: If your blood sugar is still low at the 15 minute recheck --> then, go back to STEP 1 and treat AGAIN with another 15 grams of carbohydrates.

## 2022-09-18 NOTE — Progress Notes (Signed)
Name: Lonnie Jones  Age/ Sex: 47 y.o., male   MRN/ DOB: [id number], 06-30-1975     PCP: Hannah Beat, MD   Reason for Endocrinology Evaluation: Type 2 Diabetes Mellitus  Initial Endocrine Consultative Visit: 05/25/2019    PATIENT IDENTIFIER: [name] is a 47 y.o. male with a past medical history of T2DM, Asthma,and  Dyslipidemia. The patient has followed with Endocrinology clinic since 05/25/2019 for consultative assistance with management of his diabetes.  DIABETIC HISTORY:  [name] was diagnosed with T2DM in 2016, he has been on oral glycemic agents since his diagnosis. No prior use of insulin. His hemoglobin A1c has ranged from  6.9% in 2019, peaking at 8.5% in 07/2017.  On his initial visit to our clinic his A1c was 7.5%. He was on metformin, glipizide and pioglitazone. We reduced Metformin due to GI side effects and started farxiga.    Metformin stopped by 05/2021 due to GI side effects and started Ozempic  Glipizide stopped in April 2023 with an A1c of 6.6% to prevent hypoglycemia  Works in sewer and drain cleaning  SUBJECTIVE:   During the last visit (03/17/2022): A1c 6.6 %.     Today (09/18/2022): [name] is here for a follow up on diabetes management.  He checks his blood sugars a few times a day through the dexcom G7 .The patient has not had hypoglycemic episodes since the last clinic visit.     He was evaluated by Ortho for left knee pain 4//2024, pending MRI results   S/P right shoulder sx in 04/2022, improving  Denies nausea, vomiting or diarrhea but has severe constipation  He injured his left shoulder  with a tendon tear, pending left shoulder sx    HOME DIABETES REGIMEN:  Farxiga 10 mg daily  Pioglitazone 45 mg daily  Ozempic 1 mg weekly     Statin: Yes ACE-I/ARB: Yes  CONTINUOUS GLUCOSE MONITORING RECORD INTERPRETATION    Dates of Recording: 3/15-3/28/2024  Sensor description:dexcom  Results statistics:   CGM use %  of time 100  Average and SD 176/43  Time in range    61    %  % Time Above 180 33  % Time above 250 5  % Time Below target <1   Glycemic patterns summary: BG's mostly within goal at night and increase during the day   Hyperglycemic episodes  during the day   Hypoglycemic episodes occurred n/a  Overnight periods: mostly optimal    DIABETIC COMPLICATIONS: Microvascular complications:  Neuropathy Denies: CKD, retinopathy  Last eye exam: Completed 04/2022 Macrovascular complications:  Denies: CAD, PVD, CVA      HISTORY:  Past Medical History:  Past Medical History:  Diagnosis Date   Acute idiopathic gout involving toe 06/22/2017   Allergic rhinitis due to allergen 08/25/2011   Asthma    GERD (gastroesophageal reflux disease)    Hyperlipidemia LDL goal < 70 12/16/2010   Migraine headache    Type 2 diabetes mellitus with peripheral neuropathy 08/08/2010   Past Surgical History:  Past Surgical History:  Procedure Laterality Date   ANKLE SURGERY     APPENDECTOMY  06/02/1998   CARPAL TUNNEL RELEASE     GANGLION CYST EXCISION     KNEE ARTHROSCOPY W/ ACL RECONSTRUCTION AND PATELLA GRAFT     KNEE SURGERY  06/02/2009   SHOULDER SURGERY Left    Social History:  reports that he has quit smoking. His smoking use included cigarettes. His smokeless tobacco use includes snuff.  He reports current alcohol use of about 1.0 standard drink of alcohol per week. He reports that he does not use drugs. Family History:  Family History  Problem Relation Age of Onset   Healthy Mother    Healthy Father    Colon cancer Neg Hx    Colon polyps Neg Hx    Esophageal cancer Neg Hx    Stomach cancer Neg Hx    Rectal cancer Neg Hx      HOME MEDICATIONS: Allergies as of 09/18/2022       Reactions   Codeine Itching        Medication List        Accurate as of September 18, 2022  9:00 AM. If you have any questions, ask your nurse or doctor.          albuterol 108 (90 Base) MCG/ACT  inhaler Commonly known as: VENTOLIN HFA Inhale 1-2 puffs into the lungs every 6 (six) hours as needed for wheezing or shortness of breath.   azelastine 0.1 % nasal spray Commonly known as: ASTELIN INHALE 2 SPRAYS IN EACH NOSTRIL TWICE A DAY   Contour Next EZ w/Device Kit Check blood sugar 2 times daily as needed   Contour Next Test test strip Generic drug: glucose blood Check blood sugar 2 times daily as needed   dapagliflozin propanediol 10 MG Tabs tablet Commonly known as: Farxiga Take 1 tablet (10 mg total) by mouth daily.   desloratadine 5 MG tablet Commonly known as: CLARINEX Take 5 mg by mouth daily.   Dexcom G7 Sensor Misc 1 Device by Does not apply route as directed.   eszopiclone 1 MG Tabs tablet Commonly known as: LUNESTA TAKE 1 TABLET BY MOUTH AT BEDTIME AS NEEDED FOR SLEEP TAKE  IMMEDIATELY  BEFORE  BEDTIME   gabapentin 400 MG capsule Commonly known as: NEURONTIN Take 1 capsule (400 mg total) by mouth 2 (two) times daily.   meloxicam 15 MG tablet Commonly known as: MOBIC Take 1 tablet by mouth once daily   montelukast 10 MG tablet Commonly known as: SINGULAIR Take 1 tablet (10 mg total) by mouth daily.   Ozempic (1 MG/DOSE) 4 MG/3ML Sopn Generic drug: Semaglutide (1 MG/DOSE) Inject 1 mg under the skin once weekly.   pioglitazone 45 MG tablet Commonly known as: ACTOS Take 1 tablet (45 mg total) by mouth daily.   pravastatin 20 MG tablet Commonly known as: PRAVACHOL Take 1 tablet by mouth once daily   tiZANidine 4 MG tablet Commonly known as: ZANAFLEX TAKE 1 TABLET BY MOUTH AT BEDTIME AS NEEDED FOR MUSCLE SPASMS.         OBJECTIVE:   Vital Signs:BP 126/80 (BP Location: Left Arm, Patient Position: Sitting, Cuff Size: Large)   Pulse 89   Ht 5' 6.75" (1.695 m)   Wt 228 lb (103.4 kg)   SpO2 98%   BMI 35.98 kg/m   Wt Readings from Last 3 Encounters:  09/18/22 228 lb (103.4 kg)  04/17/22 224 lb 4 oz (101.7 kg)  03/19/22 228 lb (103.4 kg)      Exam: General: Pt appears well and is in NAD  Lungs: Clear with good BS bilat with no rales, rhonchi, or wheezes  Heart: RRR with normal S1 and S2 and no gallops; no murmurs; no rub  Abdomen: Normoactive bowel sounds, soft, nontender, without masses or organomegaly palpable  Extremities: No pretibial edema.   Neuro: MS is good with appropriate affect, pt is alert and Ox3  DM foot exam: 09/18/2022 The skin of the feet is intact without sores or ulcerations. The pedal pulses are 1+ on right and 1+ on left. The sensation is intact to a screening 5.07, 10 gram monofilament bilaterally    DATA REVIEWED:  Lab Results  Component Value Date   HGBA1C 6.7 (A) 09/18/2022   HGBA1C 6.8 (H) 04/10/2022   HGBA1C 6.6 (A) 03/17/2022    Latest Reference Range & Units 04/10/22 08:37  Sodium 135 - 145 mEq/L 138  Potassium 3.5 - 5.1 mEq/L 5.0  Chloride 96 - 112 mEq/L 102  CO2 19 - 32 mEq/L 30  Glucose 70 - 99 mg/dL 409 (H)  BUN 6 - 23 mg/dL 21  Creatinine 8.11 - 9.14 mg/dL 7.82  Calcium 8.4 - 95.6 mg/dL 9.3  Alkaline Phosphatase 39 - 117 U/L 51  Albumin 3.5 - 5.2 g/dL 4.4  AST 0 - 37 U/L 15  ALT 0 - 53 U/L 29  Total Protein 6.0 - 8.3 g/dL 6.5  Bilirubin, Direct 0.0 - 0.3 mg/dL 0.2  Total Bilirubin 0.2 - 1.2 mg/dL 0.9  GFR >21.30 mL/min 110.98  Total CHOL/HDL Ratio  3  Cholesterol 0 - 200 mg/dL 865  HDL Cholesterol >78.46 mg/dL 96.29  LDL (calc) 0 - 99 mg/dL 73  MICROALB/CREAT RATIO 0.0 - 30.0 mg/g 1.8  NonHDL  97.74  Triglycerides 0.0 - 149.0 mg/dL 528.4  VLDL 0.0 - 13.2 mg/dL 44.0    Latest Reference Range & Units 04/10/22 08:37  Creatinine,U mg/dL 10.2  Microalb, Ur 0.0 - 1.9 mg/dL 1.2  MICROALB/CREAT RATIO 0.0 - 30.0 mg/g 1.8     ASSESSMENT / PLAN / RECOMMENDATIONS:   1) Type 2 Diabetes Mellitus, Optimally controlled, With Neuropathic complications - Most recent A1c of 6.6 %. Goal A1c < 7.0 %.    -A1c remains at goal -He is intolerant to metformin -Pt with severe  constipation to Ozempic, will switch to [facility]     MEDICATIONS:  - Switch   Ozempic 1 mg to Mounjaro 5 mg weekly  - Continue Farxiga 10 mg tablets - Continue Pioglitazone 45 mg daily   EDUCATION / INSTRUCTIONS: BG monitoring instructions: Patient is instructed to check his blood sugars 1 times a day. Call [facility] if: BG persistently < 70  I reviewed the Rule of 15 for the treatment of hypoglycemia in detail with the patient. Literature supplied.    F/U in 6 months   Signed electronically by: Lyndle Herrlich, MD  [facility]  [facility] [address] [place], [place] [address] Phone: [phone] FAX: [phone]   CC: Hannah Beat, MD [address] [place] [address] Phone: [phone]  Fax: [phone]  Return to Endocrinology clinic as below: No future appointments.

## 2022-09-19 ENCOUNTER — Other Ambulatory Visit: Payer: Self-pay | Admitting: Internal Medicine

## 2022-09-19 MED ORDER — SEMAGLUTIDE (1 MG/DOSE) 4 MG/3ML ~~LOC~~ SOPN: 1.0000 mg | PEN_INJECTOR | SUBCUTANEOUS | 3 refills | Status: DC

## 2022-09-22 DIAGNOSIS — M25562 Pain in left knee: Secondary | ICD-10-CM | POA: Diagnosis not present

## 2022-10-12 ENCOUNTER — Other Ambulatory Visit: Payer: Self-pay | Admitting: Family Medicine

## 2022-10-13 NOTE — Telephone Encounter (Signed)
Last office visit 04/17/22 for CPE.  Last refilled 06/11/2022 for #30 with 3 refills.  Next Appt: No future appointments with PCP.

## 2022-10-29 ENCOUNTER — Telehealth: Payer: Self-pay

## 2022-10-29 MED ORDER — TRULICITY 3 MG/0.5ML ~~LOC~~ SOAJ: 3.0000 mg | SUBCUTANEOUS | 3 refills | Status: DC

## 2022-10-29 NOTE — Telephone Encounter (Signed)
Patient aware and will let us know if it needs to be sent somewhere else

## 2022-10-29 NOTE — Telephone Encounter (Signed)
Patient states that the Ozempic gives him bad stomach cramps and constipation and he will not take it anymore. He is due for a shot today and needs to have an alternative.

## 2022-10-29 NOTE — Telephone Encounter (Signed)
LDTVM and mychart message has been sent

## 2022-10-31 ENCOUNTER — Telehealth: Payer: Self-pay

## 2022-10-31 ENCOUNTER — Other Ambulatory Visit (HOSPITAL_COMMUNITY): Payer: Self-pay

## 2022-10-31 NOTE — Telephone Encounter (Signed)
Trulicity need PA

## 2022-10-31 NOTE — Telephone Encounter (Signed)
Patient Advocate Encounter   Received notification from [facility] that prior authorization is required for Trulicity  Submitted: 10/31/22 Key [id number]  Status is pending

## 2022-11-03 ENCOUNTER — Telehealth: Payer: Self-pay

## 2022-11-03 NOTE — Telephone Encounter (Signed)
Any update on PA?

## 2022-11-04 ENCOUNTER — Other Ambulatory Visit (HOSPITAL_COMMUNITY): Payer: Self-pay

## 2022-11-04 ENCOUNTER — Telehealth: Payer: Self-pay

## 2022-11-04 MED ORDER — TRULICITY 3 MG/0.5ML ~~LOC~~ SOAJ
3.0000 mg | SUBCUTANEOUS | 3 refills | Status: DC
  Filled 2022-11-04: qty 2, 28d supply, fill #0
  Filled 2022-11-05 (×3): qty 6, 84d supply, fill #0
  Filled 2022-11-05: qty 2, 28d supply, fill #0
  Filled 2023-01-11: qty 6, 84d supply, fill #1

## 2022-11-04 NOTE — Telephone Encounter (Signed)
Also needed a PA on Mounjaro as well

## 2022-11-04 NOTE — Telephone Encounter (Signed)
Sent to [facility]

## 2022-11-04 NOTE — Telephone Encounter (Signed)
Additional clinical information submitted to insurance today (6/4)

## 2022-11-04 NOTE — Telephone Encounter (Signed)
Pharmacy Patient Advocate Encounter  Prior Authorization for Trulicity has been APPROVED  Effective through 11/04/2023

## 2022-11-04 NOTE — Telephone Encounter (Signed)
Do you know if PA was done for the [facility] because that what they have it stock and it needs to be mark as urgent so they will make determination within 24hrs,

## 2022-11-04 NOTE — Telephone Encounter (Signed)
The pharmacy has the [employer] in stock so they would prefer we do that one

## 2022-11-04 NOTE — Addendum Note (Signed)
Addended by: Lisabeth Pick on: 11/04/2022 01:06 PM   Modules accepted: Orders

## 2022-11-05 ENCOUNTER — Other Ambulatory Visit (HOSPITAL_COMMUNITY): Payer: Self-pay

## 2022-11-05 ENCOUNTER — Other Ambulatory Visit: Payer: Self-pay

## 2022-11-05 ENCOUNTER — Telehealth: Payer: Self-pay

## 2022-11-05 MED ORDER — TRULICITY 1.5 MG/0.5ML ~~LOC~~ SOAJ
1.5000 mg | SUBCUTANEOUS | 1 refills | Status: DC
  Filled 2022-11-05: qty 2, 28d supply, fill #0

## 2022-11-05 NOTE — Telephone Encounter (Signed)
Patient Advocate Encounter   Received notification from pt msgs that prior authorization is required for Mounjaro 5MG /0.5ML pen-injectors  Submitted: 11/05/22 Key [id number]  Waiting for clinical questions to populate

## 2022-11-05 NOTE — Telephone Encounter (Addendum)
Patient is in the office and went to the [facility] and they don't have the Trulicity 3mg   in stock, so we need the [facility] PA done ASAP. It needs to says urgent to get done within 24hrs.

## 2022-11-05 NOTE — Telephone Encounter (Signed)
Received message about Mounjaro.   This is conflicting information from what the doctor put in the notes yesterday:    Here is a list of pharmacies that have Trulicity. I recommend [facility] since they have the most in stock

## 2022-11-05 NOTE — Telephone Encounter (Signed)
We still need the PA for the [facility] please.

## 2022-11-06 ENCOUNTER — Other Ambulatory Visit (HOSPITAL_COMMUNITY): Payer: Self-pay

## 2022-11-06 NOTE — Telephone Encounter (Signed)
Clinical info sent to insurance 11/06/22  Sent for expedited review

## 2022-11-07 ENCOUNTER — Other Ambulatory Visit: Payer: Self-pay

## 2022-11-07 DIAGNOSIS — J301 Allergic rhinitis due to pollen: Secondary | ICD-10-CM | POA: Diagnosis not present

## 2022-11-07 DIAGNOSIS — J3089 Other allergic rhinitis: Secondary | ICD-10-CM | POA: Diagnosis not present

## 2022-11-07 DIAGNOSIS — H1045 Other chronic allergic conjunctivitis: Secondary | ICD-10-CM | POA: Diagnosis not present

## 2022-11-07 DIAGNOSIS — J4520 Mild intermittent asthma, uncomplicated: Secondary | ICD-10-CM | POA: Diagnosis not present

## 2022-11-07 MED ORDER — IPRATROPIUM BROMIDE 0.06 % NA SOLN
2.0000 | Freq: Three times a day (TID) | NASAL | 3 refills | Status: AC | PRN
  Filled 2022-11-07: qty 15, 25d supply, fill #0

## 2022-11-07 MED ORDER — FLUTICASONE PROPIONATE 50 MCG/ACT NA SUSP
1.0000 | Freq: Every day | NASAL | 5 refills | Status: DC
  Filled 2022-11-07: qty 16, 60d supply, fill #0
  Filled 2022-11-27: qty 16, 30d supply, fill #0
  Filled 2023-03-22: qty 16, 60d supply, fill #0
  Filled 2023-08-17: qty 16, 60d supply, fill #1
  Filled 2023-10-13: qty 16, 60d supply, fill #2

## 2022-11-07 MED ORDER — AZELASTINE HCL 0.1 % NA SOLN
2.0000 | Freq: Two times a day (BID) | NASAL | 5 refills | Status: DC
  Filled 2022-11-07 – 2022-11-27 (×2): qty 30, 50d supply, fill #0
  Filled 2023-03-22: qty 30, 50d supply, fill #1
  Filled 2023-08-17: qty 30, 50d supply, fill #2
  Filled 2023-10-06: qty 30, 50d supply, fill #3

## 2022-11-07 MED ORDER — MONTELUKAST SODIUM 10 MG PO TABS
10.0000 mg | ORAL_TABLET | Freq: Every day | ORAL | 5 refills | Status: DC
  Filled 2022-11-07 – 2022-12-23 (×3): qty 30, 30d supply, fill #0
  Filled 2023-01-11 – 2023-01-21 (×2): qty 30, 30d supply, fill #1
  Filled 2023-02-18: qty 30, 30d supply, fill #2
  Filled 2023-03-22: qty 30, 30d supply, fill #3
  Filled 2023-04-20: qty 30, 30d supply, fill #4

## 2022-11-07 MED ORDER — DESLORATADINE 5 MG PO TABS
5.0000 mg | ORAL_TABLET | Freq: Every day | ORAL | 5 refills | Status: DC
  Filled 2022-11-07 – 2023-01-11 (×3): qty 30, 30d supply, fill #0
  Filled 2023-03-22: qty 30, 30d supply, fill #1
  Filled 2023-04-20 – 2023-05-05 (×2): qty 30, 30d supply, fill #2

## 2022-11-07 MED ORDER — ALBUTEROL SULFATE HFA 108 (90 BASE) MCG/ACT IN AERS
2.0000 | INHALATION_SPRAY | RESPIRATORY_TRACT | 0 refills | Status: AC | PRN
  Filled 2022-11-07: qty 6.7, 30d supply, fill #0
  Filled 2022-11-27: qty 6.7, 25d supply, fill #0

## 2022-11-07 NOTE — Telephone Encounter (Signed)
Pharmacy Patient Advocate Encounter  Prior Authorization for Lonnie Jones has been APPROVED   Effective through 11/06/23

## 2022-11-27 ENCOUNTER — Other Ambulatory Visit: Payer: Self-pay

## 2022-12-23 ENCOUNTER — Other Ambulatory Visit: Payer: Self-pay

## 2022-12-23 ENCOUNTER — Other Ambulatory Visit (HOSPITAL_COMMUNITY): Payer: Self-pay

## 2022-12-24 ENCOUNTER — Other Ambulatory Visit (HOSPITAL_COMMUNITY): Payer: Self-pay

## 2023-01-01 ENCOUNTER — Other Ambulatory Visit (HOSPITAL_COMMUNITY): Payer: Self-pay

## 2023-01-11 ENCOUNTER — Other Ambulatory Visit: Payer: Self-pay

## 2023-01-11 ENCOUNTER — Other Ambulatory Visit (HOSPITAL_COMMUNITY): Payer: Self-pay

## 2023-01-12 ENCOUNTER — Other Ambulatory Visit: Payer: Self-pay

## 2023-01-12 ENCOUNTER — Telehealth: Payer: Self-pay

## 2023-01-12 ENCOUNTER — Other Ambulatory Visit (HOSPITAL_COMMUNITY): Payer: Self-pay

## 2023-01-12 MED ORDER — GLIMEPIRIDE 1 MG PO TABS
1.0000 mg | ORAL_TABLET | Freq: Every day | ORAL | 3 refills | Status: DC
Start: 2023-01-12 — End: 2023-01-12

## 2023-01-12 MED ORDER — GLIMEPIRIDE 1 MG PO TABS
1.0000 mg | ORAL_TABLET | Freq: Every day | ORAL | 3 refills | Status: DC
  Filled 2023-01-12: qty 90, 90d supply, fill #0

## 2023-01-12 NOTE — Telephone Encounter (Signed)
Patient states that his having severe constipation with the Trulicity and doesn't wont to continue. Would like to know what else he can do ?

## 2023-01-12 NOTE — Telephone Encounter (Signed)
Patient advised and will pick up medication  

## 2023-01-13 ENCOUNTER — Other Ambulatory Visit (HOSPITAL_COMMUNITY): Payer: Self-pay

## 2023-02-19 ENCOUNTER — Other Ambulatory Visit: Payer: Self-pay

## 2023-02-19 ENCOUNTER — Other Ambulatory Visit (HOSPITAL_COMMUNITY): Payer: Self-pay

## 2023-02-19 ENCOUNTER — Encounter: Payer: Self-pay | Admitting: Pharmacist

## 2023-02-20 ENCOUNTER — Other Ambulatory Visit (HOSPITAL_COMMUNITY): Payer: Self-pay

## 2023-02-23 ENCOUNTER — Other Ambulatory Visit: Payer: Self-pay

## 2023-03-13 ENCOUNTER — Other Ambulatory Visit: Payer: Self-pay

## 2023-03-13 DIAGNOSIS — E1165 Type 2 diabetes mellitus with hyperglycemia: Secondary | ICD-10-CM

## 2023-03-13 MED ORDER — DAPAGLIFLOZIN PROPANEDIOL 10 MG PO TABS
10.0000 mg | ORAL_TABLET | Freq: Every day | ORAL | 3 refills | Status: DC
Start: 2023-03-13 — End: 2023-03-24

## 2023-03-22 ENCOUNTER — Other Ambulatory Visit (HOSPITAL_COMMUNITY): Payer: Self-pay

## 2023-03-23 ENCOUNTER — Other Ambulatory Visit: Payer: Self-pay

## 2023-03-23 ENCOUNTER — Other Ambulatory Visit (HOSPITAL_COMMUNITY): Payer: Self-pay

## 2023-03-24 ENCOUNTER — Encounter: Payer: Self-pay | Admitting: Internal Medicine

## 2023-03-24 ENCOUNTER — Ambulatory Visit: Payer: BC Managed Care – PPO | Admitting: Internal Medicine

## 2023-03-24 ENCOUNTER — Other Ambulatory Visit (HOSPITAL_COMMUNITY): Payer: Self-pay

## 2023-03-24 ENCOUNTER — Other Ambulatory Visit: Payer: Self-pay

## 2023-03-24 VITALS — BP 136/84 | HR 97 | Ht 66.75 in | Wt 226.0 lb

## 2023-03-24 DIAGNOSIS — E1142 Type 2 diabetes mellitus with diabetic polyneuropathy: Secondary | ICD-10-CM

## 2023-03-24 DIAGNOSIS — Z7984 Long term (current) use of oral hypoglycemic drugs: Secondary | ICD-10-CM | POA: Diagnosis not present

## 2023-03-24 LAB — POCT GLYCOSYLATED HEMOGLOBIN (HGB A1C): Hemoglobin A1C: 6.7 % — AB (ref 4.0–5.6)

## 2023-03-24 MED ORDER — PIOGLITAZONE HCL 45 MG PO TABS
45.0000 mg | ORAL_TABLET | Freq: Every day | ORAL | 3 refills | Status: DC
  Filled 2023-03-24: qty 90, 90d supply, fill #0
  Filled 2023-06-24: qty 90, 90d supply, fill #1
  Filled 2023-08-17 – 2023-09-23 (×2): qty 90, 90d supply, fill #2
  Filled 2023-12-28: qty 90, 90d supply, fill #3

## 2023-03-24 MED ORDER — DAPAGLIFLOZIN PROPANEDIOL 10 MG PO TABS
10.0000 mg | ORAL_TABLET | Freq: Every day | ORAL | 3 refills | Status: DC
  Filled 2023-03-24 – 2023-07-11 (×6): qty 90, 90d supply, fill #0
  Filled 2023-10-06 – 2023-10-09 (×2): qty 90, 90d supply, fill #1
  Filled 2023-12-28: qty 90, 90d supply, fill #2

## 2023-03-24 MED ORDER — GLIPIZIDE 5 MG PO TABS
5.0000 mg | ORAL_TABLET | Freq: Two times a day (BID) | ORAL | 3 refills | Status: DC
  Filled 2023-03-24: qty 180, 90d supply, fill #0
  Filled 2023-06-24: qty 180, 90d supply, fill #1
  Filled 2023-08-17 – 2023-09-23 (×2): qty 180, 90d supply, fill #2

## 2023-03-24 NOTE — Progress Notes (Signed)
Name: Lonnie Jones  Age/ Sex: 47 y.o., male   MRN/ DOB: [id number], 02-03-1976     PCP: Hannah Beat, MD   Reason for Endocrinology Evaluation: Type 2 Diabetes Mellitus  Initial Endocrine Consultative Visit: 05/25/2019    PATIENT IDENTIFIER: Lonnie Jones is a 47 y.o. male with a past medical history of T2DM, Asthma,and  Dyslipidemia. The patient has followed with Endocrinology clinic since 05/25/2019 for consultative assistance with management of his diabetes.  DIABETIC HISTORY:  Lonnie Jones was diagnosed with T2DM in 2016, he has been on oral glycemic agents since his diagnosis. No prior use of insulin. His hemoglobin A1c has ranged from  6.9% in 2019, peaking at 8.5% in 07/2017.  On his initial visit to our clinic his A1c was 7.5%. He was on metformin, glipizide and pioglitazone. We reduced Metformin due to GI side effects and started farxiga.    Metformin stopped by 05/2021 due to GI side effects and started Ozempic  Glipizide stopped in April 2023 with an A1c of 6.6% to prevent hypoglycemia  Works in sewer and drain Publishing rights manager and Trulicity caused severe constipation and started glimepiride 01/2023  SUBJECTIVE:   During the last visit (09/18/2022): A1c 6.6 %.     Today (03/24/2023): Lonnie Jones is here for a follow up on diabetes management.  He checks his blood sugars a few times a day through the dexcom G7 .The patient has not had hypoglycemic episodes since the last clinic visit.   Constipation has resolved  Denies nausea or vomiting   HOME DIABETES REGIMEN:  Farxiga 10 mg daily  Pioglitazone 45 mg daily  Glimepiride 1 mg daily      Statin: Yes ACE-I/ARB: Yes  CONTINUOUS GLUCOSE MONITORING RECORD INTERPRETATION    Dates of Recording: 10/9-10/22/2024  Sensor description:dexcom  Results statistics:   CGM use % of time 71  Average and SD 193  Time in range  49  %  % Time Above 180 35  % Time above 250 16  % Time Below target 0    Glycemic patterns summary: BG's trend down overnight and fluctuate during the day Hyperglycemic episodes  during the day   Hypoglycemic episodes occurred n/a  Overnight periods: mostly optimal    DIABETIC COMPLICATIONS: Microvascular complications:  Neuropathy Denies: CKD, retinopathy  Last eye exam: Completed 04/2022 Macrovascular complications:  Denies: CAD, PVD, CVA      HISTORY:  Past Medical History:  Past Medical History:  Diagnosis Date   Acute idiopathic gout involving toe 06/22/2017   Allergic rhinitis due to allergen 08/25/2011   Asthma    GERD (gastroesophageal reflux disease)    Hyperlipidemia LDL goal < 70 12/16/2010   Migraine headache    Type 2 diabetes mellitus with peripheral neuropathy (HCC) 08/08/2010   Past Surgical History:  Past Surgical History:  Procedure Laterality Date   ANKLE SURGERY     APPENDECTOMY  06/02/1998   CARPAL TUNNEL RELEASE     GANGLION CYST EXCISION     KNEE ARTHROSCOPY W/ ACL RECONSTRUCTION AND PATELLA GRAFT     KNEE SURGERY  06/02/2009   SHOULDER SURGERY Left    Social History:  reports that he has quit smoking. His smoking use included cigarettes. His smokeless tobacco use includes snuff. He reports current alcohol use of about 1.0 standard drink of alcohol per week. He reports that he does not use drugs. Family History:  Family History  Problem Relation Age of Onset   Healthy Mother  Healthy Father    Colon cancer Neg Hx    Colon polyps Neg Hx    Esophageal cancer Neg Hx    Stomach cancer Neg Hx    Rectal cancer Neg Hx      HOME MEDICATIONS: Allergies as of 03/24/2023       Reactions   Codeine Itching        Medication List        Accurate as of March 24, 2023  9:20 AM. If you have any questions, ask your nurse or doctor.          STOP taking these medications    Trulicity 1.5 MG/0.5ML Soaj Generic drug: Dulaglutide Stopped by: Johnney Ou Edeline Greening   Trulicity 3 MG/0.5ML Soaj Generic  drug: Dulaglutide Stopped by: Johnney Ou Grayton Lobo       TAKE these medications    albuterol 108 (90 Base) MCG/ACT inhaler Commonly known as: VENTOLIN HFA Inhale 2 puffs into the lungs every 4 (four) to 6 (six)  hours as needed for cough/wheeze What changed: Another medication with the same name was removed. Continue taking this medication, and follow the directions you see here. Changed by: Johnney Ou Chyann Ambrocio   azelastine 0.1 % nasal spray Commonly known as: ASTELIN INHALE 2 SPRAYS IN EACH NOSTRIL TWICE A DAY   Azelastine HCl 137 MCG/SPRAY Soln Place 2 sprays into both nostrils 2 (two) times daily.   Contour Next EZ w/Device Kit Check blood sugar 2 times daily as needed   Contour Next Test test strip Generic drug: glucose blood Check blood sugar 2 times daily as needed   dapagliflozin propanediol 10 MG Tabs tablet Commonly known as: Farxiga Take 1 tablet (10 mg total) by mouth daily.   desloratadine 5 MG tablet Commonly known as: CLARINEX Take 5 mg by mouth daily.   desloratadine 5 MG tablet Commonly known as: CLARINEX Take 1 tablet (5 mg total) by mouth daily.   Dexcom G7 Sensor Misc Use 1 sensor as directed.   EPINEPHrine 0.3 mg/0.3 mL Soaj injection Commonly known as: EPI-PEN INJECT INTO THE OUTER THIGH AS NEEDED FOR ANAPHYLAXIS for 1   eszopiclone 1 MG Tabs tablet Commonly known as: LUNESTA TAKE 1 TABLET BY MOUTH AT BEDTIME AS NEEDED FOR SLEEP TAKE IMMEDIATELY BEFORE BEDTIME   fexofenadine 180 MG tablet Commonly known as: ALLEGRA 1 tablet Swallow whole with water; do not take with fruit juices. Orally Once a day for 30 day(s)   fluticasone 50 MCG/ACT nasal spray Commonly known as: FLONASE Place 1 spray into both nostrils daily.   gabapentin 400 MG capsule Commonly known as: NEURONTIN Take 1 capsule (400 mg total) by mouth 2 (two) times daily.   glimepiride 1 MG tablet Commonly known as: AMARYL Take 1 tablet (1 mg total) by mouth daily with  breakfast.   ipratropium 0.06 % nasal spray Commonly known as: ATROVENT 2 sprays in each nostril Nasally Three times a day as needed for 30 days   ipratropium 0.06 % nasal spray Commonly known as: ATROVENT Place 2 sprays into the nose 3 (three) times daily as needed.   levocetirizine 5 MG tablet Commonly known as: XYZAL 1 tablet in the evening Orally Once a day for 30 day(s)   meloxicam 15 MG tablet Commonly known as: MOBIC Take 1 tablet by mouth once daily   montelukast 10 MG tablet Commonly known as: SINGULAIR Take 1 tablet (10 mg total) by mouth daily. What changed: Another medication with the same name was removed. Continue  taking this medication, and follow the directions you see here. Changed by: Johnney Ou Devra Stare   olopatadine 0.1 % ophthalmic solution Commonly known as: PATANOL INSTILL 1 DROP INTO BOTH EYES TWICE A DAY for 30   pioglitazone 45 MG tablet Commonly known as: ACTOS Take 1 tablet (45 mg total) by mouth daily.   pravastatin 20 MG tablet Commonly known as: PRAVACHOL Take 1 tablet by mouth once daily   tiZANidine 4 MG tablet Commonly known as: ZANAFLEX TAKE 1 TABLET BY MOUTH AT BEDTIME AS NEEDED FOR MUSCLE SPASMS.         OBJECTIVE:   Vital Signs:BP 136/84 (BP Location: Left Arm, Patient Position: Sitting, Cuff Size: Large)   Pulse 97   Ht 5' 6.75" (1.695 m)   Wt 226 lb (102.5 kg)   SpO2 99%   BMI 35.66 kg/m   Wt Readings from Last 3 Encounters:  03/24/23 226 lb (102.5 kg)  09/18/22 228 lb (103.4 kg)  04/17/22 224 lb 4 oz (101.7 kg)     Exam: General: Pt appears well and is in NAD  Lungs: Clear with good BS bilat   Heart: RRR   Abdomen:  soft, nontender  Extremities: No pretibial edema.   Neuro: MS is good with appropriate affect, pt is alert and Ox3    DM foot exam: 03/24/2023 The skin of the feet is intact without sores or ulcerations. The pedal pulses are 1+ on right and 1+ on left. The sensation is intact to a screening  5.07, 10 gram monofilament bilaterally    DATA REVIEWED:  Lab Results  Component Value Date   HGBA1C 6.7 (A) 09/18/2022   HGBA1C 6.8 (H) 04/10/2022   HGBA1C 6.6 (A) 03/17/2022    Latest Reference Range & Units 04/10/22 08:37  Sodium 135 - 145 mEq/L 138  Potassium 3.5 - 5.1 mEq/L 5.0  Chloride 96 - 112 mEq/L 102  CO2 19 - 32 mEq/L 30  Glucose 70 - 99 mg/dL 161 (H)  BUN 6 - 23 mg/dL 21  Creatinine 0.96 - 0.45 mg/dL 4.09  Calcium 8.4 - 81.1 mg/dL 9.3  Alkaline Phosphatase 39 - 117 U/L 51  Albumin 3.5 - 5.2 g/dL 4.4  AST 0 - 37 U/L 15  ALT 0 - 53 U/L 29  Total Protein 6.0 - 8.3 g/dL 6.5  Bilirubin, Direct 0.0 - 0.3 mg/dL 0.2  Total Bilirubin 0.2 - 1.2 mg/dL 0.9  GFR >91.47 mL/min 110.98  Total CHOL/HDL Ratio  3  Cholesterol 0 - 200 mg/dL 829  HDL Cholesterol >56.21 mg/dL 30.86  LDL (calc) 0 - 99 mg/dL 73  MICROALB/CREAT RATIO 0.0 - 30.0 mg/g 1.8  NonHDL  97.74  Triglycerides 0.0 - 149.0 mg/dL 578.4  VLDL 0.0 - 69.6 mg/dL 29.5    Latest Reference Range & Units 04/10/22 08:37  Creatinine,U mg/dL 28.4  Microalb, Ur 0.0 - 1.9 mg/dL 1.2  MICROALB/CREAT RATIO 0.0 - 30.0 mg/g 1.8     ASSESSMENT / PLAN / RECOMMENDATIONS:   1) Type 2 Diabetes Mellitus, Optimally controlled, With Neuropathic complications - Most recent A1c of 6.7%. Goal A1c < 7.0 %.    -Intolerant to Ozempic and Trulicity due to severe constipation -He is tolerating glimepiride without any side effects, but has been noted with postprandial hyperglycemia on CGM download, this has been noted to be worse after supper -I have recommended switching glimepiride to glipizide, to be taken before breakfast and supper -He is intolerant to metformin -Greggory Keen is not covered by his insurance  MEDICATIONS: -Stop glimepiride -Start glipizide 5 mg, 1 tablet before breakfast 1 tablet before supper - Continue Farxiga 10 mg tablets - Continue Pioglitazone 45 mg daily   EDUCATION / INSTRUCTIONS: BG monitoring  instructions: Patient is instructed to check his blood sugars 1 times a day. Call [facility] if: BG persistently < 70  I reviewed the Rule of 15 for the treatment of hypoglycemia in detail with the patient. Literature supplied.    F/U in 6 months   Signed electronically by: Lyndle Herrlich, MD  [facility]  [facility] [address] [place], [place] [address] Phone: [phone] FAX: [phone]   CC: Hannah Beat, MD [address] [place] [address] Phone: [phone]  Fax: [phone]  Return to Endocrinology clinic as below: No future appointments.

## 2023-03-24 NOTE — Patient Instructions (Signed)
-   Stop Glimepiride  - Start Glipizide 5 mg , 1 tablet before Breakfast and 1 tablet before Supper  - Continue Farxiga 10 mg tablets - Continue Pioglitazone 45 mg daily        HOW TO TREAT LOW BLOOD SUGARS (Blood sugar LESS THAN 70 MG/DL) Please follow the RULE OF 15 for the treatment of hypoglycemia treatment (when your (blood sugars are less than 70 mg/dL)   STEP 1: Take 15 grams of carbohydrates when your blood sugar is low, which includes:  3-4 GLUCOSE TABS  OR 3-4 OZ OF JUICE OR REGULAR SODA OR ONE TUBE OF GLUCOSE GEL    STEP 2: RECHECK blood sugar in 15 MINUTES STEP 3: If your blood sugar is still low at the 15 minute recheck --> then, go back to STEP 1 and treat AGAIN with another 15 grams of carbohydrates.

## 2023-03-27 ENCOUNTER — Other Ambulatory Visit: Payer: Self-pay | Admitting: Internal Medicine

## 2023-04-12 ENCOUNTER — Other Ambulatory Visit: Payer: Self-pay | Admitting: Family Medicine

## 2023-04-13 NOTE — Telephone Encounter (Signed)
Last office visit 04/17/2022 for CPE.  Last refilled 10/13/22 for #30 with 5 refills.  Next Appt: No future appointments with PCP.   Please schedule CPE with fasting labs prior with Dr. Patsy Lager.

## 2023-04-13 NOTE — Telephone Encounter (Signed)
Spoke to pt, scheduled cpe for 06/10/23

## 2023-04-14 ENCOUNTER — Encounter: Payer: Self-pay | Admitting: Internal Medicine

## 2023-04-14 ENCOUNTER — Other Ambulatory Visit (HOSPITAL_COMMUNITY): Payer: Self-pay

## 2023-04-14 ENCOUNTER — Ambulatory Visit (INDEPENDENT_AMBULATORY_CARE_PROVIDER_SITE_OTHER): Payer: BC Managed Care – PPO | Admitting: Internal Medicine

## 2023-04-14 VITALS — BP 112/70 | HR 82 | Temp 98.9°F | Ht 66.75 in | Wt 227.0 lb

## 2023-04-14 DIAGNOSIS — J0140 Acute pansinusitis, unspecified: Secondary | ICD-10-CM | POA: Diagnosis not present

## 2023-04-14 MED ORDER — AMOXICILLIN-POT CLAVULANATE 875-125 MG PO TABS
1.0000 | ORAL_TABLET | Freq: Two times a day (BID) | ORAL | 0 refills | Status: DC
  Filled 2023-04-14: qty 14, 7d supply, fill #0

## 2023-04-14 NOTE — Progress Notes (Signed)
Subjective:    Patient ID: Lonnie Jones, male    DOB: [birth year], 47 y.o.   MRN: [id number]  HPI Here due to respiratory illness  Has fall and spring allergies Symptoms go back for a while---over a month Lots of post nasal drainage--now dark yellow mucus/blood tinged Some throat pain Some cough Blows nose constantly No fever Some SOB--needs inhaler Some ear pain and headache (temporal)  Taking allergy meds---some help Flonase and azelastine still  Current Outpatient Medications on File Prior to Visit  Medication Sig Dispense Refill   albuterol (VENTOLIN HFA) 108 (90 Base) MCG/ACT inhaler Inhale 2 puffs into the lungs every 4 (four) to 6 (six)  hours as needed for cough/wheeze 6.7 g 0   azelastine (ASTELIN) 0.1 % nasal spray Place 2 sprays into both nostrils 2 (two) times daily. 30 mL 5   Blood Glucose Monitoring Suppl (CONTOUR NEXT EZ) w/Device KIT Check blood sugar 2 times daily as needed 1 kit 0   Continuous Glucose Sensor (DEXCOM G7 SENSOR) MISC Use 1 sensor as directed. 9 each 2   dapagliflozin propanediol (FARXIGA) 10 MG TABS tablet Take 1 tablet (10 mg total) by mouth daily. 90 tablet 3   desloratadine (CLARINEX) 5 MG tablet Take 1 tablet (5 mg total) by mouth daily. 30 tablet 5   eszopiclone (LUNESTA) 1 MG TABS tablet TAKE 1 TABLET BY MOUTH ONCE DAILY AT  BEDTIME  AS  NEEDED  FOR  SLEEP.  TAKE  IMMEDIATELY  BEFORE  BEDTIME 30 tablet 3   fluticasone (FLONASE) 50 MCG/ACT nasal spray Place 1 spray into both nostrils daily. 16 g 5   gabapentin (NEURONTIN) 400 MG capsule Take 1 capsule (400 mg total) by mouth 2 (two) times daily. 180 capsule 3   glipiZIDE (GLUCOTROL) 5 MG tablet Take 1 tablet (5 mg total) by mouth 2 (two) times daily before a meal. 180 tablet 3   glucose blood (CONTOUR NEXT TEST) test strip Check blood sugar 2 times daily as needed 100 each 12   ipratropium (ATROVENT) 0.06 % nasal spray Place 2 sprays into the nose 3 (three) times daily as needed. 15 mL 3    levocetirizine (XYZAL) 5 MG tablet 1 tablet in the evening Orally Once a day for 30 day(s)     meloxicam (MOBIC) 15 MG tablet Take 1 tablet by mouth once daily 90 tablet 3   montelukast (SINGULAIR) 10 MG tablet Take 1 tablet (10 mg total) by mouth daily. 30 tablet 5   pioglitazone (ACTOS) 45 MG tablet Take 1 tablet (45 mg total) by mouth daily. 90 tablet 3   pravastatin (PRAVACHOL) 20 MG tablet Take 1 tablet by mouth once daily 90 tablet 3   tiZANidine (ZANAFLEX) 4 MG tablet TAKE 1 TABLET BY MOUTH AT BEDTIME AS NEEDED FOR MUSCLE SPASMS. 90 tablet 3   No current facility-administered medications on file prior to visit.    Allergies  Allergen Reactions   Codeine Itching    Past Medical History:  Diagnosis Date   Acute idiopathic gout involving toe 06/22/2017   Allergic rhinitis due to allergen 08/25/2011   Asthma    GERD (gastroesophageal reflux disease)    Hyperlipidemia LDL goal < 70 12/16/2010   Migraine headache    Type 2 diabetes mellitus with peripheral neuropathy (HCC) 08/08/2010    Past Surgical History:  Procedure Laterality Date   ANKLE SURGERY     APPENDECTOMY  06/02/1998   CARPAL TUNNEL RELEASE     GANGLION CYST  EXCISION     KNEE ARTHROSCOPY W/ ACL RECONSTRUCTION AND PATELLA GRAFT     KNEE SURGERY  06/02/2009   SHOULDER SURGERY Left     Family History  Problem Relation Age of Onset   Healthy Mother    Healthy Father    Colon cancer Neg Hx    Colon polyps Neg Hx    Esophageal cancer Neg Hx    Stomach cancer Neg Hx    Rectal cancer Neg Hx     Social History   Socioeconomic History   Marital status: Married    Spouse name: Not on file   Number of children: 2   Years of education: Not on file   Highest education level: Not on file  Occupational History    Comment: Sewer  Tobacco Use   Smoking status: Former    Types: Cigarettes   Smokeless tobacco: Current    Types: Snuff  Substance and Sexual Activity   Alcohol use: Yes    Alcohol/week: 1.0  standard drink of alcohol    Types: 1 Standard drinks or equivalent per week   Drug use: No   Sexual activity: Yes    Birth control/protection: None    Comment: Married  Other Topics Concern   Not on file  Social History Narrative   Not on file   Social Determinants of Health   Financial Resource Strain: Not on file  Food Insecurity: Not on file  Transportation Needs: Not on file  Physical Activity: Not on file  Stress: Not on file  Social Connections: Not on file  Intimate Partner Violence: Not on file   Review of Systems No N/V Eating okay     Objective:   Physical Exam Constitutional:      Appearance: Normal appearance.  HENT:     Head:     Comments: No sinus tenderness    Right Ear: Tympanic membrane and ear canal normal.     Left Ear: Tympanic membrane and ear canal normal.     Mouth/Throat:     Comments: Right tonsillar enlargement with some injection. No exudates Pulmonary:     Effort: Pulmonary effort is normal.     Breath sounds: Normal breath sounds. No wheezing or rales.  Musculoskeletal:     Cervical back: Neck supple.  Lymphadenopathy:     Cervical: No cervical adenopathy.  Neurological:     Mental Status: He is alert.            Assessment & Plan:

## 2023-04-14 NOTE — Assessment & Plan Note (Signed)
Persistent Started with allergies--not with apparent secondary infection Continue allergy regimen---add analgesics Augmentin 875 bid x 7 days

## 2023-04-20 ENCOUNTER — Other Ambulatory Visit: Payer: Self-pay

## 2023-04-20 ENCOUNTER — Telehealth: Payer: Self-pay

## 2023-04-20 ENCOUNTER — Other Ambulatory Visit: Payer: Self-pay | Admitting: Internal Medicine

## 2023-04-20 ENCOUNTER — Other Ambulatory Visit: Payer: Self-pay | Admitting: Family Medicine

## 2023-04-20 ENCOUNTER — Other Ambulatory Visit (HOSPITAL_COMMUNITY): Payer: Self-pay

## 2023-04-20 DIAGNOSIS — E1142 Type 2 diabetes mellitus with diabetic polyneuropathy: Secondary | ICD-10-CM

## 2023-04-20 MED ORDER — LEVOCETIRIZINE DIHYDROCHLORIDE 5 MG PO TABS
5.0000 mg | ORAL_TABLET | Freq: Every evening | ORAL | 0 refills | Status: DC
  Filled 2023-04-20: qty 90, 90d supply, fill #0

## 2023-04-20 NOTE — Telephone Encounter (Signed)
Can we process PA for the [place]

## 2023-04-21 ENCOUNTER — Telehealth: Payer: Self-pay

## 2023-04-21 ENCOUNTER — Other Ambulatory Visit: Payer: Self-pay

## 2023-04-21 ENCOUNTER — Other Ambulatory Visit (HOSPITAL_COMMUNITY): Payer: Self-pay

## 2023-04-21 NOTE — Telephone Encounter (Signed)
Pharmacy Patient Advocate Encounter   Received notification from Pt Calls Messages that prior authorization for Marcelline Deist is required/requested.   Insurance verification completed.   The patient is insured through [facility] .   Per test claim: The current 30 day co-pay is, $40.  No PA needed at this time. This test claim was processed through [facility]- copay amounts may vary at other pharmacies due to pharmacy/plan contracts, or as the patient moves through the different stages of their insurance plan.

## 2023-04-21 NOTE — Telephone Encounter (Signed)
Patient aware.

## 2023-04-22 ENCOUNTER — Other Ambulatory Visit: Payer: Self-pay

## 2023-04-22 ENCOUNTER — Other Ambulatory Visit (HOSPITAL_COMMUNITY): Payer: Self-pay

## 2023-04-29 ENCOUNTER — Other Ambulatory Visit: Payer: Self-pay | Admitting: Family Medicine

## 2023-05-05 ENCOUNTER — Other Ambulatory Visit: Payer: Self-pay | Admitting: Family Medicine

## 2023-05-06 ENCOUNTER — Other Ambulatory Visit (HOSPITAL_COMMUNITY): Payer: Self-pay

## 2023-05-06 ENCOUNTER — Other Ambulatory Visit: Payer: Self-pay

## 2023-05-06 MED ORDER — PRAVASTATIN SODIUM 20 MG PO TABS
20.0000 mg | ORAL_TABLET | Freq: Every day | ORAL | 0 refills | Status: DC
  Filled 2023-05-06 – 2023-05-22 (×3): qty 90, 90d supply, fill #0

## 2023-05-07 ENCOUNTER — Other Ambulatory Visit (HOSPITAL_COMMUNITY): Payer: Self-pay

## 2023-05-10 ENCOUNTER — Other Ambulatory Visit: Payer: Self-pay | Admitting: Family Medicine

## 2023-05-11 DIAGNOSIS — E119 Type 2 diabetes mellitus without complications: Secondary | ICD-10-CM | POA: Diagnosis not present

## 2023-05-11 LAB — HM DIABETES EYE EXAM

## 2023-05-11 NOTE — Telephone Encounter (Signed)
Last office visit 04/14/2023 with Dr. Alphonsus Sias for pansinusitis.  Last refilled 05/12/22 for #90 with 3 refills.  Next Appt: CPE 06/10/2023.

## 2023-05-20 ENCOUNTER — Other Ambulatory Visit (HOSPITAL_COMMUNITY): Payer: Self-pay

## 2023-05-20 ENCOUNTER — Other Ambulatory Visit: Payer: Self-pay

## 2023-05-20 NOTE — Telephone Encounter (Signed)
 Care team updated and letter sent for eye exam notes.

## 2023-05-22 ENCOUNTER — Other Ambulatory Visit (HOSPITAL_COMMUNITY): Payer: Self-pay

## 2023-05-26 ENCOUNTER — Other Ambulatory Visit (HOSPITAL_COMMUNITY): Payer: Self-pay

## 2023-06-04 ENCOUNTER — Other Ambulatory Visit: Payer: Self-pay | Admitting: Internal Medicine

## 2023-06-04 ENCOUNTER — Other Ambulatory Visit (HOSPITAL_COMMUNITY): Payer: Self-pay

## 2023-06-07 ENCOUNTER — Encounter: Payer: Self-pay | Admitting: Family Medicine

## 2023-06-07 NOTE — Progress Notes (Signed)
 Lonnie Jones Spease, MD, CAQ Sports Medicine [facility] at [facility] [address] [place], [address]  Phone: [phone]  FAX: [phone]  LLOYDE LUDLAM - 48 y.o. male  MRN [id number]  Date of Birth: [birth year]  Date: 06/10/2023  PCP: Watt Mirza, MD  Referral: Watt Mirza, MD  Chief Complaint  Patient presents with   Annual Exam    fasting   Patient Care Team: Watt Mirza, MD as PCP - General (Family Medicine) [facility] [place] Subjective:   Lonnie Jones is a 48 y.o. pleasant patient who presents with the following:  Preventative Health Maintenance Visit:  Health Maintenance Summary Reviewed and updated, unless pt declines services.  Tobacco History Reviewed. Dips some. Alcohol: No concerns, no excessive use Exercise Habits: Some activity, rec at least 30 mins 5 times a week - work only STD concerns: no risk or activity to increase risk Drug Use: None  Tdap Flu Covid booster  Wt Readings from Last 3 Encounters:  06/10/23 225 lb (102.1 kg)  04/14/23 227 lb (103 kg)  03/24/23 226 lb (102.5 kg)     Is a very pleasant patient, who I have known for many years.  He currently sees endocrinology for diabetes management.  Hyperlipidemia, he is on Pravachol  20 mg.  He is working everyday from 8 AM - 8 PM, and he is covering weekend call.  Health Maintenance  Topic Date Due   COVID-19 Vaccine (3 - Pfizer risk series) 10/17/2019   DTaP/Tdap/Td (2 - Td or Tdap) 07/28/2022   Diabetic kidney evaluation - eGFR measurement  04/11/2023   Diabetic kidney evaluation - Urine ACR  04/11/2023   INFLUENZA VACCINE  08/31/2023 (Originally 01/01/2023)   HEMOGLOBIN A1C  09/22/2023   FOOT EXAM  03/23/2024   OPHTHALMOLOGY EXAM  05/10/2024   Colonoscopy  02/26/2025   Hepatitis C Screening  Completed   HIV Screening  Completed   HPV VACCINES  Aged Out   Immunization History  Administered Date(s) Administered    Influenza, High Dose Seasonal PF 09/27/2019, 08/28/2020   Influenza, Seasonal, Injecte, Preservative Fre 07/28/2012   Influenza,inj,Quad PF,6+ Mos 01/26/2013, 02/15/2015, 05/12/2016, 02/24/2018, 02/11/2019, 04/02/2020, 04/10/2021, 04/17/2022   PFIZER(Purple Top)SARS-COV-2 Vaccination 08/26/2019, 09/19/2019   PNEUMOCOCCAL CONJUGATE-20 04/10/2021   Pneumococcal Polysaccharide-23 07/28/2012, 08/24/2017, 05/07/2021   Tdap 07/28/2012   Patient Active Problem List   Diagnosis Date Noted   Type 2 diabetes mellitus with peripheral neuropathy (HCC) 08/08/2010    Priority: High   Peripheral autonomic neuropathy due to diabetes mellitus (HCC) 02/16/2015    Priority: Medium    Hyperlipidemia with target LDL less than 70 12/16/2010    Priority: Medium    Allergic rhinitis due to allergen 08/25/2011    Priority: Low   Migraine headache 08/08/2010    Priority: Low   Asthma 08/08/2010    Past Medical History:  Diagnosis Date   Acute idiopathic gout involving toe 06/22/2017   Allergic rhinitis due to allergen 08/25/2011   Asthma    GERD (gastroesophageal reflux disease)    Hyperlipidemia LDL goal < 70 12/16/2010   Migraine headache    Type 2 diabetes mellitus with peripheral neuropathy (HCC) 08/08/2010    Past Surgical History:  Procedure Laterality Date   ANKLE SURGERY     APPENDECTOMY  06/02/1998   CARPAL TUNNEL RELEASE     GANGLION CYST EXCISION     KNEE ARTHROSCOPY W/ ACL RECONSTRUCTION AND PATELLA GRAFT     SHOULDER SURGERY  Left     Family History  Problem Relation Age of Onset   Healthy Mother    Healthy Father    Heart disease Maternal Grandmother    ADD / ADHD Son    ADD / ADHD Son    Colon cancer Neg Hx    Colon polyps Neg Hx    Esophageal cancer Neg Hx    Stomach cancer Neg Hx    Rectal cancer Neg Hx     Social History   Social History Narrative   Not on file    Past Medical History, Surgical History, Social History, Family History, Problem List, Medications,  and Allergies have been reviewed and updated if relevant.  Review of Systems: Pertinent positives are listed above.  Otherwise, a full 14 point review of systems has been done in full and it is negative except where it is noted positive.  Objective:   BP 110/62   Pulse 87   Temp 99 F (37.2 C)   Ht 5' 8.5 (1.74 m)   Wt 225 lb (102.1 kg)   SpO2 97%   BMI 33.71 kg/m  Ideal Body Weight: Weight in (lb) to have BMI = 25: 166.5  Ideal Body Weight: Weight in (lb) to have BMI = 25: 166.5 No results found.    06/10/2023    8:22 AM 04/17/2022    8:25 AM 06/27/2019    3:35 PM 01/19/2019    8:16 AM 08/24/2017    8:33 AM  Depression screen PHQ 2/9  Decreased Interest 1 0 0 0 0  Down, Depressed, Hopeless 0 0 0 0 0  PHQ - 2 Score 1 0 0 0 0  Altered sleeping 0      Tired, decreased energy 2      Change in appetite 2      Feeling bad or failure about yourself  2      Trouble concentrating 2      Moving slowly or fidgety/restless 2      Suicidal thoughts 0      PHQ-9 Score 11      Difficult doing work/chores Not difficult at all         GEN: well developed, well nourished, no acute distress Eyes: conjunctiva and lids normal, PERRLA, EOMI ENT: TM clear, nares clear, oral exam WNL Neck: supple, no lymphadenopathy, no thyromegaly, no JVD Pulm: clear to auscultation and percussion, respiratory effort normal CV: regular rate and rhythm, S1-S2, no murmur, rub or gallop, no bruits, peripheral pulses normal and symmetric, no cyanosis, clubbing, edema or varicosities GI: soft, non-tender; no hepatosplenomegaly, masses; active bowel sounds all quadrants GU: deferred Lymph: no cervical, axillary or inguinal adenopathy MSK: gait normal, muscle tone and strength WNL, no joint swelling, effusions, discoloration, crepitus  SKIN: clear, good turgor, color WNL, no rashes, lesions, or ulcerations Neuro: normal mental status, normal strength, sensation, and motion Psych: alert; oriented to person, place  and time, normally interactive and not anxious or depressed in appearance.  All labs reviewed with patient. Results for orders placed or performed in visit on 04/08/23  HM DIABETES EYE EXAM   Collection Time: 05/11/23 12:00 AM  Result Value Ref Range   HM Diabetic Eye Exam No Retinopathy No Retinopathy    Assessment and Plan:     ICD-10-CM   1. Healthcare maintenance  Z00.00     2. Type 2 diabetes mellitus with peripheral neuropathy (HCC)  E11.42 Microalbumin / creatinine urine ratio    Basic metabolic panel  3. Hyperlipidemia with target LDL less than 70  E78.5 Lipid panel    4. Encounter for long-term (current) use of medications  Z79.899 CBC with Differential/Platelet    Hepatic function panel    5. Screening for prostate cancer  Z12.5 PSA, Total with Reflex to PSA, Free     Tdap and Flu today  He is overall doing well, stressed out from work and working a lot. DM has been under good control.  Check labs F/u 1 year  Health Maintenance Exam: The patient's preventative maintenance and recommended screening tests for an annual wellness exam were reviewed in full today. Brought up to date unless services declined.  Counselled on the importance of diet, exercise, and its role in overall health and mortality. The patient's FH and SH was reviewed, including their home life, tobacco status, and drug and alcohol status.  Follow-up in 1 year for physical exam or additional follow-up below.  Disposition: No follow-ups on file.  Meds ordered this encounter  Medications   diclofenac  (VOLTAREN ) 75 MG EC tablet    Sig: Take 1 tablet (75 mg total) by mouth 2 (two) times daily.    Dispense:  180 tablet    Refill:  3   eszopiclone  (LUNESTA ) 1 MG TABS tablet    Sig: TAKE 1 TABLET BY MOUTH ONCE DAILY AT  BEDTIME  AS  NEEDED  FOR  SLEEP.  TAKE  IMMEDIATELY  BEFORE  BEDTIME    Dispense:  30 tablet    Refill:  5   Medications Discontinued During This Encounter  Medication Reason    amoxicillin -clavulanate (AUGMENTIN ) 875-125 MG tablet Completed Course   desloratadine  (CLARINEX ) 5 MG tablet Patient Preference   montelukast  (SINGULAIR ) 10 MG tablet    eszopiclone  (LUNESTA ) 1 MG TABS tablet Reorder   Orders Placed This Encounter  Procedures   Microalbumin / creatinine urine ratio   Basic metabolic panel   CBC with Differential/Platelet   Hepatic function panel   Lipid panel   PSA, Total with Reflex to PSA, Free    Signed,  Allahna Husband T. Tenesha Garza, MD   Allergies as of 06/10/2023       Reactions   Codeine Itching        Medication List        Accurate as of June 10, 2023  8:58 AM. If you have any questions, ask your nurse or doctor.          STOP taking these medications    amoxicillin -clavulanate 875-125 MG tablet Commonly known as: AUGMENTIN    desloratadine  5 MG tablet Commonly known as: CLARINEX    montelukast  10 MG tablet Commonly known as: SINGULAIR        TAKE these medications    albuterol  108 (90 Base) MCG/ACT inhaler Commonly known as: VENTOLIN  HFA Inhale 2 puffs into the lungs every 4 (four) to 6 (six)  hours as needed for cough/wheeze   Azelastine  HCl 137 MCG/SPRAY Soln Place 2 sprays into both nostrils 2 (two) times daily.   Contour Next EZ w/Device Kit Check blood sugar 2 times daily as needed   Contour Next Test test strip Generic drug: glucose blood Check blood sugar 2 times daily as needed   dapagliflozin  propanediol 10 MG Tabs tablet Commonly known as: Farxiga  Take 1 tablet (10 mg total) by mouth daily.   Dexcom G7 Sensor Misc Use 1 sensor as directed.   diclofenac  75 MG EC tablet Commonly known as: VOLTAREN  Take 1 tablet (75 mg total) by mouth 2 (two)  times daily.   eszopiclone  1 MG Tabs tablet Commonly known as: LUNESTA  TAKE 1 TABLET BY MOUTH ONCE DAILY AT  BEDTIME  AS  NEEDED  FOR  SLEEP.  TAKE  IMMEDIATELY  BEFORE  BEDTIME   fluticasone  50 MCG/ACT nasal spray Commonly known as: FLONASE  Place 1  spray into both nostrils daily.   gabapentin  400 MG capsule Commonly known as: NEURONTIN  Take 1 capsule (400 mg total) by mouth 2 (two) times daily.   glipiZIDE  5 MG tablet Commonly known as: GLUCOTROL  Take 1 tablet (5 mg total) by mouth 2 (two) times daily before a meal.   ipratropium 0.06 % nasal spray Commonly known as: ATROVENT  Place 2 sprays into the nose 3 (three) times daily as needed.   levocetirizine 5 MG tablet Commonly known as: XYZAL  Take 1 tablet (5 mg total) by mouth every evening.   meloxicam  15 MG tablet Commonly known as: MOBIC  Take 1 tablet by mouth once daily   pioglitazone  45 MG tablet Commonly known as: ACTOS  Take 1 tablet (45 mg total) by mouth daily.   pravastatin  20 MG tablet Commonly known as: PRAVACHOL  Take 1 tablet (20 mg total) by mouth daily.   tiZANidine  4 MG tablet Commonly known as: ZANAFLEX  TAKE 1 TABLET BY MOUTH AT BEDTIME AS NEEDED FOR MUSCLE SPASM

## 2023-06-09 ENCOUNTER — Encounter: Payer: Self-pay | Admitting: Family Medicine

## 2023-06-10 ENCOUNTER — Ambulatory Visit: Payer: BC Managed Care – PPO | Admitting: Family Medicine

## 2023-06-10 ENCOUNTER — Encounter: Payer: Self-pay | Admitting: Family Medicine

## 2023-06-10 VITALS — BP 110/62 | HR 87 | Temp 99.0°F | Ht 68.5 in | Wt 225.0 lb

## 2023-06-10 DIAGNOSIS — Z23 Encounter for immunization: Secondary | ICD-10-CM | POA: Diagnosis not present

## 2023-06-10 DIAGNOSIS — E785 Hyperlipidemia, unspecified: Secondary | ICD-10-CM | POA: Diagnosis not present

## 2023-06-10 DIAGNOSIS — Z125 Encounter for screening for malignant neoplasm of prostate: Secondary | ICD-10-CM | POA: Diagnosis not present

## 2023-06-10 DIAGNOSIS — Z0000 Encounter for general adult medical examination without abnormal findings: Secondary | ICD-10-CM | POA: Diagnosis not present

## 2023-06-10 DIAGNOSIS — Z79899 Other long term (current) drug therapy: Secondary | ICD-10-CM

## 2023-06-10 DIAGNOSIS — E1142 Type 2 diabetes mellitus with diabetic polyneuropathy: Secondary | ICD-10-CM | POA: Diagnosis not present

## 2023-06-10 LAB — CBC WITH DIFFERENTIAL/PLATELET
Basophils Absolute: 0 10*3/uL (ref 0.0–0.1)
Basophils Relative: 0.4 % (ref 0.0–3.0)
Eosinophils Absolute: 0 10*3/uL (ref 0.0–0.7)
Eosinophils Relative: 1 % (ref 0.0–5.0)
HCT: 51.1 % (ref 39.0–52.0)
Hemoglobin: 17.1 g/dL — ABNORMAL HIGH (ref 13.0–17.0)
Lymphocytes Relative: 27.1 % (ref 12.0–46.0)
Lymphs Abs: 1 10*3/uL (ref 0.7–4.0)
MCHC: 33.5 g/dL (ref 30.0–36.0)
MCV: 91.9 fL (ref 78.0–100.0)
Monocytes Absolute: 0.4 10*3/uL (ref 0.1–1.0)
Monocytes Relative: 9.8 % (ref 3.0–12.0)
Neutro Abs: 2.3 10*3/uL (ref 1.4–7.7)
Neutrophils Relative %: 61.7 % (ref 43.0–77.0)
Platelets: 182 10*3/uL (ref 150.0–400.0)
RBC: 5.56 Mil/uL (ref 4.22–5.81)
RDW: 13 % (ref 11.5–15.5)
WBC: 3.8 10*3/uL — ABNORMAL LOW (ref 4.0–10.5)

## 2023-06-10 LAB — LIPID PANEL
Cholesterol: 143 mg/dL (ref 0–200)
HDL: 41.7 mg/dL (ref >39.00)
LDL Cholesterol: 84 mg/dL (ref 0–99)
NonHDL: 101.71
Total CHOL/HDL Ratio: 3
Triglycerides: 89 mg/dL (ref 0.0–149.0)
VLDL: 17.8 mg/dL (ref 0.0–40.0)

## 2023-06-10 LAB — MICROALBUMIN / CREATININE URINE RATIO
Creatinine,U: 62.3 mg/dL
Microalb Creat Ratio: 1.5 mg/g (ref 0.0–30.0)
Microalb, Ur: 0.9 mg/dL (ref 0.0–1.9)

## 2023-06-10 LAB — HEPATIC FUNCTION PANEL
ALT: 25 U/L (ref 0–53)
AST: 16 U/L (ref 0–37)
Albumin: 4.5 g/dL (ref 3.5–5.2)
Alkaline Phosphatase: 60 U/L (ref 39–117)
Bilirubin, Direct: 0.2 mg/dL (ref 0.0–0.3)
Total Bilirubin: 0.8 mg/dL (ref 0.2–1.2)
Total Protein: 6.4 g/dL (ref 6.0–8.3)

## 2023-06-10 LAB — BASIC METABOLIC PANEL
BUN: 28 mg/dL — ABNORMAL HIGH (ref 6–23)
CO2: 30 meq/L (ref 19–32)
Calcium: 9.6 mg/dL (ref 8.4–10.5)
Chloride: 103 meq/L (ref 96–112)
Creatinine, Ser: 0.69 mg/dL (ref 0.40–1.50)
GFR: 110.55 mL/min (ref >60.00)
Glucose, Bld: 173 mg/dL — ABNORMAL HIGH (ref 70–99)
Potassium: 5.1 meq/L (ref 3.5–5.1)
Sodium: 139 meq/L (ref 135–145)

## 2023-06-10 MED ORDER — DICLOFENAC SODIUM 75 MG PO TBEC: 75.0000 mg | DELAYED_RELEASE_TABLET | Freq: Two times a day (BID) | ORAL | 3 refills | Status: DC

## 2023-06-10 MED ORDER — ESZOPICLONE 1 MG PO TABS: ORAL_TABLET | ORAL | 5 refills | Status: DC

## 2023-06-11 LAB — PSA, TOTAL WITH REFLEX TO PSA, FREE: PSA, Total: 0.7 ng/mL (ref <=4.0)

## 2023-06-24 ENCOUNTER — Other Ambulatory Visit (HOSPITAL_COMMUNITY): Payer: Self-pay

## 2023-06-24 ENCOUNTER — Other Ambulatory Visit: Payer: Self-pay

## 2023-06-24 ENCOUNTER — Other Ambulatory Visit: Payer: Self-pay | Admitting: Family Medicine

## 2023-06-24 MED ORDER — LEVOCETIRIZINE DIHYDROCHLORIDE 5 MG PO TABS
5.0000 mg | ORAL_TABLET | Freq: Every evening | ORAL | 3 refills | Status: DC
  Filled 2023-06-24: qty 90, 90d supply, fill #0
  Filled 2023-08-17 – 2023-10-13 (×2): qty 90, 90d supply, fill #1
  Filled 2024-01-13 (×2): qty 90, 90d supply, fill #2
  Filled 2024-04-18: qty 90, 90d supply, fill #3

## 2023-06-25 ENCOUNTER — Other Ambulatory Visit: Payer: Self-pay

## 2023-07-12 ENCOUNTER — Other Ambulatory Visit: Payer: Self-pay

## 2023-07-13 ENCOUNTER — Encounter: Payer: Self-pay | Admitting: Pharmacist

## 2023-07-13 ENCOUNTER — Other Ambulatory Visit (HOSPITAL_COMMUNITY): Payer: Self-pay

## 2023-07-13 ENCOUNTER — Other Ambulatory Visit: Payer: Self-pay

## 2023-08-05 ENCOUNTER — Telehealth: Admitting: Physician Assistant

## 2023-08-05 DIAGNOSIS — U071 COVID-19: Secondary | ICD-10-CM

## 2023-08-05 MED ORDER — NIRMATRELVIR/RITONAVIR (PAXLOVID)TABLET: 3.0000 | ORAL_TABLET | Freq: Two times a day (BID) | ORAL | 0 refills | Status: AC

## 2023-08-05 NOTE — Progress Notes (Signed)
 Virtual Visit Consent   [name], you are scheduled for a virtual visit with a  provider today. Just as with appointments in the office, your consent must be obtained to participate. Your consent will be active for this visit and any virtual visit you may have with one of our providers in the next 365 days. If you have a MyChart account, a copy of this consent can be sent to you electronically.  As this is a virtual visit, video technology does not allow for your provider to perform a traditional examination. This may limit your provider's ability to fully assess your condition. If your provider identifies any concerns that need to be evaluated in person or the need to arrange testing (such as labs, EKG, etc.), we will make arrangements to do so. Although advances in technology are sophisticated, we cannot ensure that it will always work on either your end or our end. If the connection with a video visit is poor, the visit may have to be switched to a telephone visit. With either a video or telephone visit, we are not always able to ensure that we have a secure connection.  By engaging in this virtual visit, you consent to the provision of healthcare and authorize for your insurance to be billed (if applicable) for the services provided during this visit. Depending on your insurance coverage, you may receive a charge related to this service.  I need to obtain your verbal consent now. Are you willing to proceed with your visit today? [name] has provided verbal consent on 08/05/2023 for a virtual visit (video or telephone). [name], PA-C  Date: 08/05/2023 8:26 AM   Virtual Visit via Video Note   I, [name], connected with  Lonnie Jones  ([id number], [birth year]) on 08/05/23 at  8:15 AM EST by a video-enabled telemedicine application and verified that I am speaking with the correct person using two identifiers.  Location: Patient: Virtual Visit Location  Patient: Home Provider: Virtual Visit Location Provider: Home Office   I discussed the limitations of evaluation and management by telemedicine and the availability of in person appointments. The patient expressed understanding and agreed to proceed.    History of Present Illness: Lonnie Jones is a 48 y.o. who identifies as a male who was assigned male at birth, and is being seen today for Covid 90.  HPI: URI  This is a new problem. The current episode started in the past 7 days (07/31/23). The problem has been gradually worsening. Maximum temperature: subjective fevers. The fever has been present for 1 to 2 days. Associated symptoms include chest pain, congestion, headaches, a plugged ear sensation (mild), rhinorrhea, sinus pain, sneezing and a sore throat (from drainage). Pertinent negatives include no coughing, diarrhea, ear pain, nausea, vomiting or wheezing. Associated symptoms comments: chills. He has tried increased fluids (allegra) for the symptoms. The treatment provided no relief.     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 (Take nirmatrelvir 150 mg two tablets twice daily for 5 days and ritonavir 100 mg one tablet twice daily for 5 days) Patient GFR is 110, Disp: 30 tablet, Rfl: 0   albuterol (VENTOLIN HFA) 108 (90 Base) MCG/ACT inhaler, Inhale 2 puffs into the lungs every 4 (four) to 6 (six)  hours as needed for  cough/wheeze, Disp: 6.7 g, Rfl: 0   azelastine (ASTELIN) 0.1 % nasal spray, Place 2 sprays into both nostrils 2 (two) times daily., Disp: 30 mL, Rfl: 5   Blood Glucose Monitoring Suppl (CONTOUR NEXT EZ) w/Device KIT, Check blood sugar 2 times daily as needed, Disp: 1 kit, Rfl: 0   Continuous Glucose Sensor (DEXCOM G7 SENSOR) MISC, Use 1 sensor as directed., Disp: 9 each, Rfl: 1   dapagliflozin propanediol (FARXIGA) 10 MG TABS tablet, Take 1 tablet (10 mg total) by mouth daily., Disp: 90 tablet, Rfl: 3   diclofenac (VOLTAREN) 75 MG EC tablet, Take 1 tablet (75 mg total) by mouth 2 (two) times daily., Disp: 180 tablet, Rfl: 3   eszopiclone (LUNESTA) 1 MG TABS tablet, TAKE 1 TABLET BY MOUTH ONCE DAILY AT  BEDTIME  AS  NEEDED  FOR  SLEEP.  TAKE  IMMEDIATELY  BEFORE  BEDTIME, Disp: 30 tablet, Rfl: 5   fluticasone (FLONASE) 50 MCG/ACT nasal spray, Place 1 spray into both nostrils daily., Disp: 16 g, Rfl: 5   gabapentin (NEURONTIN) 400 MG capsule, Take 1 capsule (400 mg total) by mouth 2 (two) times daily., Disp: 180 capsule, Rfl: 3   glipiZIDE (GLUCOTROL) 5 MG tablet, Take 1 tablet (5 mg total) by mouth 2 (two) times daily before a meal., Disp: 180 tablet, Rfl: 3   glucose blood (CONTOUR NEXT TEST) test strip, Check blood sugar 2 times daily as needed, Disp: 100 each, Rfl: 12   ipratropium (ATROVENT) 0.06 % nasal spray, Place 2 sprays into the nose 3 (three) times daily as needed., Disp: 15 mL, Rfl: 3   levocetirizine (XYZAL) 5 MG tablet, Take 1 tablet (5 mg total) by mouth every evening., Disp: 90 tablet, Rfl: 3   meloxicam (MOBIC) 15 MG tablet, Take 1 tablet by mouth once daily, Disp: 90 tablet, Rfl: 3   pioglitazone (ACTOS) 45 MG tablet, Take 1 tablet (45 mg total) by mouth daily., Disp: 90 tablet, Rfl: 3   pravastatin (PRAVACHOL) 20 MG tablet, Take 1 tablet (20 mg total) by mouth daily., Disp: 90 tablet, Rfl: 0   tiZANidine (ZANAFLEX) 4 MG tablet, TAKE 1 TABLET BY MOUTH AT BEDTIME AS NEEDED FOR MUSCLE  SPASM, Disp: 90 tablet, Rfl: 3  Observations/Objective: Patient is well-developed, well-nourished in no acute distress.  Resting comfortably at home.  Head is normocephalic, atraumatic.  No labored breathing.  Speech is clear and coherent with logical content.  Patient is alert and oriented at baseline.    Assessment and Plan: 1. COVID-19 (Primary) - nirmatrelvir/ritonavir (PAXLOVID) 20 x 150 MG & 10 x 100MG  TABS; Take 3 tablets by mouth 2 (two) times daily for 5 days. (Take nirmatrelvir 150 mg two tablets twice daily for 5 days and ritonavir 100 mg one tablet twice daily for 5 days) Patient GFR is 110  Dispense: 30 tablet; Refill: 0  - Continue OTC symptomatic management of choice - Will send OTC vitamins and supplement information through AVS - Paxlovid prescribed - Patient enrolled in MyChart symptom monitoring - Push fluids - Rest as needed - Discussed return  precautions and when to seek in-person evaluation, sent via AVS as well   Follow Up Instructions: I discussed the assessment and treatment plan with the patient. The patient was provided an opportunity to ask questions and all were answered. The patient agreed with the plan and demonstrated an understanding of the instructions.  A copy of instructions were sent to the patient via MyChart unless otherwise noted below.    The patient was advised to call back or seek an in-person evaluation if the symptoms worsen or if the condition fails to improve as anticipated.    [name], PA-C

## 2023-08-05 NOTE — Patient Instructions (Signed)
 Lonnie Jones, thank you for joining [name], PA-C for today's virtual visit.  While this provider is not your primary care provider (PCP), if your PCP is located in our provider database this encounter information will be shared with them immediately following your visit.   A [facility] MyChart account gives you access to today's visit and all your visits, tests, and labs performed at [facility] " click here if you don't have a  MyChart account or go to [email or web address]  Consent: (Patient) Lonnie Jones provided verbal consent for this virtual visit at the beginning of the encounter.  Current Medications:  Current Outpatient Medications:    nirmatrelvir/ritonavir (PAXLOVID) 20 x 150 MG & 10 x 100MG  TABS, Take 3 tablets by mouth 2 (two) times daily for 5 days. (Take nirmatrelvir 150 mg two tablets twice daily for 5 days and ritonavir 100 mg one tablet twice daily for 5 days) Patient GFR is 110, Disp: 30 tablet, Rfl: 0   albuterol (VENTOLIN HFA) 108 (90 Base) MCG/ACT inhaler, Inhale 2 puffs into the lungs every 4 (four) to 6 (six)  hours as needed for cough/wheeze, Disp: 6.7 g, Rfl: 0   azelastine (ASTELIN) 0.1 % nasal spray, Place 2 sprays into both nostrils 2 (two) times daily., Disp: 30 mL, Rfl: 5   Blood Glucose Monitoring Suppl (CONTOUR NEXT EZ) w/Device KIT, Check blood sugar 2 times daily as needed, Disp: 1 kit, Rfl: 0   Continuous Glucose Sensor (DEXCOM G7 SENSOR) MISC, Use 1 sensor as directed., Disp: 9 each, Rfl: 1   dapagliflozin propanediol (FARXIGA) 10 MG TABS tablet, Take 1 tablet (10 mg total) by mouth daily., Disp: 90 tablet, Rfl: 3   diclofenac (VOLTAREN) 75 MG EC tablet, Take 1 tablet (75 mg total) by mouth 2 (two) times daily., Disp: 180 tablet, Rfl: 3   eszopiclone (LUNESTA) 1 MG TABS tablet, TAKE 1 TABLET BY MOUTH ONCE DAILY AT  BEDTIME  AS  NEEDED  FOR  SLEEP.  TAKE  IMMEDIATELY  BEFORE  BEDTIME, Disp: 30 tablet, Rfl: 5    fluticasone (FLONASE) 50 MCG/ACT nasal spray, Place 1 spray into both nostrils daily., Disp: 16 g, Rfl: 5   gabapentin (NEURONTIN) 400 MG capsule, Take 1 capsule (400 mg total) by mouth 2 (two) times daily., Disp: 180 capsule, Rfl: 3   glipiZIDE (GLUCOTROL) 5 MG tablet, Take 1 tablet (5 mg total) by mouth 2 (two) times daily before a meal., Disp: 180 tablet, Rfl: 3   glucose blood (CONTOUR NEXT TEST) test strip, Check blood sugar 2 times daily as needed, Disp: 100 each, Rfl: 12   ipratropium (ATROVENT) 0.06 % nasal spray, Place 2 sprays into the nose 3 (three) times daily as needed., Disp: 15 mL, Rfl: 3   levocetirizine (XYZAL) 5 MG tablet, Take 1 tablet (5 mg total) by mouth every evening., Disp: 90 tablet, Rfl: 3   meloxicam (MOBIC) 15 MG tablet, Take 1 tablet by mouth once daily, Disp: 90 tablet, Rfl: 3   pioglitazone (ACTOS) 45 MG tablet, Take 1 tablet (45 mg total) by mouth daily., Disp: 90 tablet, Rfl: 3   pravastatin (PRAVACHOL) 20 MG tablet, Take 1 tablet (20 mg total) by mouth daily., Disp: 90 tablet, Rfl: 0   tiZANidine (ZANAFLEX) 4 MG tablet, TAKE 1 TABLET BY MOUTH AT BEDTIME AS NEEDED FOR MUSCLE SPASM, Disp: 90 tablet, Rfl: 3   Medications ordered in this encounter:  Meds ordered this encounter  Medications   nirmatrelvir/ritonavir (PAXLOVID)  20 x 150 MG & 10 x 100MG  TABS    Sig: Take 3 tablets by mouth 2 (two) times daily for 5 days. (Take nirmatrelvir 150 mg two tablets twice daily for 5 days and ritonavir 100 mg one tablet twice daily for 5 days) Patient GFR is 110    Dispense:  30 tablet    Refill:  0    Supervising Provider:   Merrilee Jansky [phone]     *If you need refills on other medications prior to your next appointment, please contact your pharmacy*  Follow-Up: Call back or seek an in-person evaluation if the symptoms worsen or if the condition fails to improve as anticipated.  [facility] [phone]  Care Instructions: Can take to lessen  severity: Vit C 500mg  twice daily Quercertin 250-500mg  twice daily Zinc 75-100mg  daily Melatonin 3-6 mg at bedtime Vit D3 1000-2000 IU daily Aspirin 81 mg daily with food Optional: Famotidine 20mg  daily Also can add tylenol/ibuprofen as needed for fevers and body aches May add Mucinex or Mucinex DM as needed for cough/congestion    Isolation Instructions: You are to isolate at home until you have been fever free for at least 24 hours without a fever-reducing medication, and symptoms have been steadily improving for 24 hours. At that time,  you can end isolation but need to mask for an additional 5 days.   If you must be around other household members who do not have symptoms, you need to make sure that both you and the family members are masking consistently with a high-quality mask.  If you note any worsening of symptoms despite treatment, please seek an in-person evaluation ASAP. If you note any significant shortness of breath or any chest pain, please seek ER evaluation. Please do not delay care!   COVID-19: What to Do if You Are Sick If you test positive and are an older adult or someone who is at high risk of getting very sick from COVID-19, treatment may be available. Contact a healthcare provider right away after a positive test to determine if you are eligible, even if your symptoms are mild right now. You can also visit a Test to Treat location and, if eligible, receive a prescription from a provider. Don't delay: Treatment must be started within the first few days to be effective. If you have a fever, cough, or other symptoms, you might have COVID-19. Most people have mild illness and are able to recover at home. If you are sick: Keep track of your symptoms. If you have an emergency warning sign (including trouble breathing), call 911. Steps to help prevent the spread of COVID-19 if you are sick If you are sick with COVID-19 or think you might have COVID-19, follow the steps below  to care for yourself and to help protect other people in your home and community. Stay home except to get medical care Stay home. Most people with COVID-19 have mild illness and can recover at home without medical care. Do not leave your home, except to get medical care. Do not visit public areas and do not go to places where you are unable to wear a mask. Take care of yourself. Get rest and stay hydrated. Take over-the-counter medicines, such as acetaminophen, to help you feel better. Stay in touch with your doctor. Call before you get medical care. Be sure to get care if you have trouble breathing, or have any other emergency warning signs, or if you think it is an  emergency. Avoid public transportation, ride-sharing, or taxis if possible. Get tested If you have symptoms of COVID-19, get tested. While waiting for test results, stay away from others, including staying apart from those living in your household. Get tested as soon as possible after your symptoms start. Treatments may be available for people with COVID-19 who are at risk for becoming very sick. Don't delay: Treatment must be started early to be effective--some treatments must begin within 5 days of your first symptoms. Contact your healthcare provider right away if your test result is positive to determine if you are eligible. Self-tests are one of several options for testing for the virus that causes COVID-19 and may be more convenient than laboratory-based tests and point-of-care tests. Ask your healthcare provider or your local health department if you need help interpreting your test results. You can visit your state, tribal, local, and territorial health department's website to look for the latest local information on testing sites. Separate yourself from other people As much as possible, stay in a specific room and away from other people and pets in your home. If possible, you should use a separate bathroom. If you need to be around  other people or animals in or outside of the home, wear a well-fitting mask. Tell your close contacts that they may have been exposed to COVID-19. An infected person can spread COVID-19 starting 48 hours (or 2 days) before the person has any symptoms or tests positive. By letting your close contacts know they may have been exposed to COVID-19, you are helping to protect everyone. See COVID-19 and Animals if you have questions about pets. If you are diagnosed with COVID-19, someone from the health department may call you. Answer the call to slow the spread. Monitor your symptoms Symptoms of COVID-19 include fever, cough, or other symptoms. Follow care instructions from your healthcare provider and local health department. Your local health authorities may give instructions on checking your symptoms and reporting information. When to seek emergency medical attention Look for emergency warning signs* for COVID-19. If someone is showing any of these signs, seek emergency medical care immediately: Trouble breathing Persistent pain or pressure in the chest New confusion Inability to wake or stay awake Pale, gray, or blue-colored skin, lips, or nail beds, depending on skin tone *This list is not all possible symptoms. Please call your medical provider for any other symptoms that are severe or concerning to you. Call 911 or call ahead to your local emergency facility: Notify the operator that you are seeking care for someone who has or may have COVID-19. Call ahead before visiting your doctor Call ahead. Many medical visits for routine care are being postponed or done by phone or telemedicine. If you have a medical appointment that cannot be postponed, call your doctor's office, and tell them you have or may have COVID-19. This will help the office protect themselves and other patients. If you are sick, wear a well-fitting mask You should wear a mask if you must be around other people or animals,  including pets (even at home). Wear a mask with the best fit, protection, and comfort for you. You don't need to wear the mask if you are alone. If you can't put on a mask (because of trouble breathing, for example), cover your coughs and sneezes in some other way. Try to stay at least 6 feet away from other people. This will help protect the people around you. Masks should not be placed on young children under  age 42 years, anyone who has trouble breathing, or anyone who is not able to remove the mask without help. Cover your coughs and sneezes Cover your mouth and nose with a tissue when you cough or sneeze. Throw away used tissues in a lined trash can. Immediately wash your hands with soap and water for at least 20 seconds. If soap and water are not available, clean your hands with an alcohol-based hand sanitizer that contains at least 60% alcohol. Clean your hands often Wash your hands often with soap and water for at least 20 seconds. This is especially important after blowing your nose, coughing, or sneezing; going to the bathroom; and before eating or preparing food. Use hand sanitizer if soap and water are not available. Use an alcohol-based hand sanitizer with at least 60% alcohol, covering all surfaces of your hands and rubbing them together until they feel dry. Soap and water are the best option, especially if hands are visibly dirty. Avoid touching your eyes, nose, and mouth with unwashed hands. Handwashing Tips Avoid sharing personal household items Do not share dishes, drinking glasses, cups, eating utensils, towels, or bedding with other people in your home. Wash these items thoroughly after using them with soap and water or put in the dishwasher. Clean surfaces in your home regularly Clean and disinfect high-touch surfaces (for example, doorknobs, tables, handles, light switches, and countertops) in your "sick room" and bathroom. In shared spaces, you should clean and disinfect  surfaces and items after each use by the person who is ill. If you are sick and cannot clean, a caregiver or other person should only clean and disinfect the area around you (such as your bedroom and bathroom) on an as needed basis. Your caregiver/other person should wait as long as possible (at least several hours) and wear a mask before entering, cleaning, and disinfecting shared spaces that you use. Clean and disinfect areas that may have blood, stool, or body fluids on them. Use household cleaners and disinfectants. Clean visible dirty surfaces with household cleaners containing soap or detergent. Then, use a household disinfectant. Use a product from [employer] List N: Disinfectants for Coronavirus (COVID-19). Be sure to follow the instructions on the label to ensure safe and effective use of the product. Many products recommend keeping the surface wet with a disinfectant for a certain period of time (look at "contact time" on the product label). You may also need to wear personal protective equipment, such as gloves, depending on the directions on the product label. Immediately after disinfecting, wash your hands with soap and water for 20 seconds. For completed guidance on cleaning and disinfecting your home, visit Complete Disinfection Guidance. Take steps to improve ventilation at home Improve ventilation (air flow) at home to help prevent from spreading COVID-19 to other people in your household. Clear out COVID-19 virus particles in the air by opening windows, using air filters, and turning on fans in your home. Use this interactive tool to learn how to improve air flow in your home. When you can be around others after being sick with COVID-19 Deciding when you can be around others is different for different situations. Find out when you can safely end home isolation. For any additional questions about your care, contact your healthcare provider or state or local health  department. 08/21/2020 Content source: [facility] ([facility]), Division of Viral Diseases This information is not intended to replace advice given to you by your health care provider. Make sure  you discuss any questions you have with your health care provider. Document Revised: 10/04/2020 Document Reviewed: 10/04/2020 Elsevier Patient Education  2022 [employer].     If you have been instructed to have an in-person evaluation today at a local Urgent Care facility, please use the link below. It will take you to a list of all of our available [facility], including address, phone number and hours of operation. Please do not delay care.  [facility]  If you or a family member do not have a primary care provider, use the link below to schedule a visit and establish care. When you choose a [facility] primary care physician or advanced practice provider, you gain a long-term partner in health. Find a Primary Care Provider  Learn more about [facility]'s in-office and virtual care options: [facility] - Get Care Now

## 2023-08-06 ENCOUNTER — Encounter (INDEPENDENT_AMBULATORY_CARE_PROVIDER_SITE_OTHER): Payer: Self-pay

## 2023-08-09 ENCOUNTER — Encounter (INDEPENDENT_AMBULATORY_CARE_PROVIDER_SITE_OTHER): Payer: Self-pay

## 2023-08-16 ENCOUNTER — Encounter (INDEPENDENT_AMBULATORY_CARE_PROVIDER_SITE_OTHER): Payer: Self-pay

## 2023-08-17 ENCOUNTER — Other Ambulatory Visit: Payer: Self-pay | Admitting: Family Medicine

## 2023-08-17 ENCOUNTER — Other Ambulatory Visit (HOSPITAL_COMMUNITY): Payer: Self-pay

## 2023-08-17 ENCOUNTER — Encounter (HOSPITAL_COMMUNITY): Payer: Self-pay | Admitting: Pharmacist

## 2023-08-17 MED ORDER — PRAVASTATIN SODIUM 20 MG PO TABS
20.0000 mg | ORAL_TABLET | Freq: Every day | ORAL | 3 refills | Status: DC
  Filled 2023-08-17: qty 90, 90d supply, fill #0
  Filled 2023-11-15: qty 90, 90d supply, fill #1
  Filled 2023-12-28 – 2024-03-06 (×2): qty 90, 90d supply, fill #2
  Filled 2024-06-14: qty 90, 90d supply, fill #3

## 2023-08-18 ENCOUNTER — Other Ambulatory Visit (HOSPITAL_COMMUNITY): Payer: Self-pay

## 2023-08-18 ENCOUNTER — Other Ambulatory Visit: Payer: Self-pay

## 2023-08-18 MED ORDER — MONTELUKAST SODIUM 10 MG PO TABS
10.0000 mg | ORAL_TABLET | Freq: Every day | ORAL | 3 refills | Status: DC
  Filled 2023-08-18: qty 30, 30d supply, fill #0
  Filled 2023-09-15: qty 30, 30d supply, fill #1
  Filled 2023-10-13: qty 30, 30d supply, fill #2
  Filled 2023-11-15 – 2023-11-24 (×2): qty 30, 30d supply, fill #3

## 2023-08-18 MED ORDER — DESLORATADINE 5 MG PO TABS
5.0000 mg | ORAL_TABLET | Freq: Every day | ORAL | 3 refills | Status: DC
  Filled 2023-08-18: qty 30, 30d supply, fill #0
  Filled 2023-09-15: qty 30, 30d supply, fill #1
  Filled 2023-10-13: qty 30, 30d supply, fill #2
  Filled 2023-11-15 – 2023-11-24 (×3): qty 30, 30d supply, fill #3

## 2023-09-07 ENCOUNTER — Other Ambulatory Visit: Payer: Self-pay | Admitting: Family Medicine

## 2023-09-08 NOTE — Telephone Encounter (Signed)
 Last office visit 06/10/23 for CPE.  Last refilled 09/04/2022 for #180 with 3 refills.  Next Appt: No future appointments with PCP.

## 2023-09-15 ENCOUNTER — Other Ambulatory Visit (HOSPITAL_COMMUNITY): Payer: Self-pay

## 2023-09-23 ENCOUNTER — Encounter: Payer: Self-pay | Admitting: Internal Medicine

## 2023-09-23 ENCOUNTER — Ambulatory Visit: Payer: BC Managed Care – PPO | Admitting: Internal Medicine

## 2023-09-23 ENCOUNTER — Other Ambulatory Visit (HOSPITAL_COMMUNITY): Payer: Self-pay

## 2023-09-23 ENCOUNTER — Other Ambulatory Visit: Payer: Self-pay

## 2023-09-23 VITALS — BP 124/80 | HR 71 | Ht 68.5 in | Wt 227.0 lb

## 2023-09-23 DIAGNOSIS — Z7984 Long term (current) use of oral hypoglycemic drugs: Secondary | ICD-10-CM | POA: Diagnosis not present

## 2023-09-23 DIAGNOSIS — E1142 Type 2 diabetes mellitus with diabetic polyneuropathy: Secondary | ICD-10-CM

## 2023-09-23 DIAGNOSIS — Z7985 Long-term (current) use of injectable non-insulin antidiabetic drugs: Secondary | ICD-10-CM | POA: Diagnosis not present

## 2023-09-23 LAB — POCT GLYCOSYLATED HEMOGLOBIN (HGB A1C): Hemoglobin A1C: 6.7 % — AB (ref 4.0–5.6)

## 2023-09-23 MED ORDER — TIRZEPATIDE 5 MG/0.5ML ~~LOC~~ SOAJ
5.0000 mg | SUBCUTANEOUS | 3 refills | Status: DC
  Filled 2023-09-23: qty 6, 84d supply, fill #0
  Filled 2023-12-24: qty 6, 84d supply, fill #1

## 2023-09-23 NOTE — Progress Notes (Signed)
 Name: Lonnie Jones  Age/ Sex: 48 y.o., male   MRN/ DOB: [id number], [birth year]     PCP: Scherrie Curt, MD   Reason for Endocrinology Evaluation: Type 2 Diabetes Mellitus  Initial Endocrine Consultative Visit: 05/25/2019    PATIENT IDENTIFIER: Lonnie Jones is a 48 y.o. male with a past medical history of T2DM, Asthma,and  Dyslipidemia. The patient has followed with Endocrinology clinic since 05/25/2019 for consultative assistance with management of his diabetes.  DIABETIC HISTORY:  Lonnie Jones was diagnosed with T2DM in 2016, he has been on oral glycemic agents since his diagnosis. No prior use of insulin. His hemoglobin A1c has ranged from  6.9% in 2019, peaking at 8.5% in 07/2017.  On his initial visit to our clinic his A1c was 7.5%. He was on metformin , glipizide  and pioglitazone . We reduced Metformin  due to GI side effects and started farxiga .    Metformin  stopped by 05/2021 due to GI side effects and started Ozempic   Glipizide  stopped in April 2023 with an A1c of 6.6% to prevent hypoglycemia  Works in sewer and drain cleaning  Ozempic  and Trulicity  caused severe constipation and started glimepiride  01/2023   Switch glimepiride  to glipizide  03/2023 due to postprandial hyperglycemia at supper  SUBJECTIVE:   During the last visit (03/24/2023): A1c 6.7 %.     Today (09/23/2023): Lonnie Jones is here for a follow up on diabetes management.  He checks his blood sugars a few times a day through the dexcom G7 .The patient has not had hypoglycemic episodes since the last clinic visit.   Constipation has resolved  Denies nausea or vomiting  Has chronic LE swelling  Patient has developed an adhesive rash to the Dexcom only on the right arm  HOME DIABETES REGIMEN:  Farxiga  10 mg daily  Pioglitazone  45 mg daily  Glipizide  5 mg, twice daily      Statin: Yes ACE-I/ARB: Yes  CONTINUOUS GLUCOSE MONITORING RECORD INTERPRETATION    Dates of Recording:  4/10-4/23/2025  Sensor description:dexcom  Results statistics:   CGM use % of time 95  Average and SD 187/48  Time in range 53  %  % Time Above 180 11  % Time above 250 0  % Time Below target 0   Glycemic patterns summary: BGs are mostly optimal overnight and fluctuate during the day Hyperglycemic episodes  during the day   Hypoglycemic episodes occurred n/a  Overnight periods: mostly optimal    DIABETIC COMPLICATIONS: Microvascular complications:  Neuropathy Denies: CKD, retinopathy  Last eye exam: Completed 05/11/2023   Macrovascular complications:  Denies: CAD, PVD, CVA      HISTORY:  Past Medical History:  Past Medical History:  Diagnosis Date   Acute idiopathic gout involving toe 06/22/2017   Allergic rhinitis due to allergen 08/25/2011   Asthma    GERD (gastroesophageal reflux disease)    Hyperlipidemia LDL goal < 70 12/16/2010   Migraine headache    Type 2 diabetes mellitus with peripheral neuropathy (HCC) 08/08/2010   Past Surgical History:  Past Surgical History:  Procedure Laterality Date   ANKLE SURGERY     APPENDECTOMY  06/02/1998   CARPAL TUNNEL RELEASE     GANGLION CYST EXCISION     KNEE ARTHROSCOPY W/ ACL RECONSTRUCTION AND PATELLA GRAFT     SHOULDER SURGERY Left    Social History:  reports that he has quit smoking. His smoking use included cigarettes. His smokeless tobacco use includes snuff. He reports current alcohol use of about 1.0  standard drink of alcohol per week. He reports that he does not use drugs. Family History:  Family History  Problem Relation Age of Onset   Healthy Mother    Healthy Father    Heart disease Maternal Grandmother    ADD / ADHD Son    ADD / ADHD Son    Colon cancer Neg Hx    Colon polyps Neg Hx    Esophageal cancer Neg Hx    Stomach cancer Neg Hx    Rectal cancer Neg Hx      HOME MEDICATIONS: Allergies as of 09/23/2023       Reactions   Codeine Itching        Medication List         Accurate as of September 23, 2023  9:00 AM. If you have any questions, ask your nurse or doctor.          albuterol  108 (90 Base) MCG/ACT inhaler Commonly known as: VENTOLIN  HFA Inhale 2 puffs into the lungs every 4 (four) to 6 (six)  hours as needed for cough/wheeze   Azelastine  HCl 137 MCG/SPRAY Soln Place 2 sprays into both nostrils 2 (two) times daily.   Contour Next EZ w/Device Kit Check blood sugar 2 times daily as needed   Contour Next Test test strip Generic drug: glucose blood Check blood sugar 2 times daily as needed   desloratadine  5 MG tablet Commonly known as: CLARINEX  Take 1 tablet (5 mg total) by mouth daily.   Dexcom G7 Sensor Misc Use 1 sensor as directed.   diclofenac  75 MG EC tablet Commonly known as: VOLTAREN  Take 1 tablet (75 mg total) by mouth 2 (two) times daily.   eszopiclone  1 MG Tabs tablet Commonly known as: LUNESTA  TAKE 1 TABLET BY MOUTH ONCE DAILY AT  BEDTIME  AS  NEEDED  FOR  SLEEP.  TAKE  IMMEDIATELY  BEFORE  BEDTIME   Farxiga  10 MG Tabs tablet Generic drug: dapagliflozin  propanediol Take 1 tablet (10 mg total) by mouth daily.   fluticasone  50 MCG/ACT nasal spray Commonly known as: FLONASE  Place 1 spray into both nostrils daily.   gabapentin  400 MG capsule Commonly known as: NEURONTIN  Take 1 capsule by mouth twice daily   glipiZIDE  5 MG tablet Commonly known as: GLUCOTROL  Take 1 tablet (5 mg total) by mouth 2 (two) times daily before a meal.   ipratropium 0.06 % nasal spray Commonly known as: ATROVENT  Place 2 sprays into the nose 3 (three) times daily as needed.   levocetirizine 5 MG tablet Commonly known as: XYZAL  Take 1 tablet (5 mg total) by mouth every evening.   meloxicam  15 MG tablet Commonly known as: MOBIC  Take 1 tablet by mouth once daily   montelukast  10 MG tablet Commonly known as: SINGULAIR  Take 1 tablet (10 mg total) by mouth daily.   pioglitazone  45 MG tablet Commonly known as: ACTOS  Take 1 tablet (45 mg  total) by mouth daily.   pravastatin  20 MG tablet Commonly known as: PRAVACHOL  Take 1 tablet (20 mg total) by mouth daily.   tiZANidine  4 MG tablet Commonly known as: ZANAFLEX  TAKE 1 TABLET BY MOUTH AT BEDTIME AS NEEDED FOR MUSCLE SPASM         OBJECTIVE:   Vital Signs:BP 124/80 (BP Location: Left Arm, Patient Position: Sitting, Cuff Size: Normal)   Pulse 71   Ht 5' 8.5" (1.74 m)   Wt 227 lb (103 kg)   SpO2 95%   BMI 34.01 kg/m  Wt Readings from Last 3 Encounters:  09/23/23 227 lb (103 kg)  06/10/23 225 lb (102.1 kg)  04/14/23 227 lb (103 kg)     Exam: General: Pt appears well and is in NAD  Lungs: Clear with good BS bilat   Heart: RRR   Abdomen:  soft, nontender  Extremities: No pretibial edema.   Neuro: MS is good with appropriate affect, pt is alert and Ox3    DM foot exam: 03/24/2023 The skin of the feet is intact without sores or ulcerations. The pedal pulses are 1+ on right and 1+ on left. The sensation is intact to a screening 5.07, 10 gram monofilament bilaterally    DATA REVIEWED:  Lab Results  Component Value Date   HGBA1C 6.7 (A) 03/24/2023   HGBA1C 6.7 (A) 09/18/2022   HGBA1C 6.8 (H) 04/10/2022    Latest Reference Range & Units 06/10/23 08:56  Sodium 135 - 145 mEq/L 139  Potassium 3.5 - 5.1 mEq/L 5.1  Chloride 96 - 112 mEq/L 103  CO2 19 - 32 mEq/L 30  Glucose 70 - 99 mg/dL 161 (H)  BUN 6 - 23 mg/dL 28 (H)  Creatinine 0.96 - 1.50 mg/dL 0.45  Calcium 8.4 - 40.9 mg/dL 9.6  Alkaline Phosphatase 39 - 117 U/L 60  Albumin 3.5 - 5.2 g/dL 4.5  AST 0 - 37 U/L 16  ALT 0 - 53 U/L 25  Total Protein 6.0 - 8.3 g/dL 6.4  Bilirubin, Direct 0.0 - 0.3 mg/dL 0.2  Total Bilirubin 0.2 - 1.2 mg/dL 0.8  GFR >81.19 mL/min 110.55  Total CHOL/HDL Ratio  3  Cholesterol 0 - 200 mg/dL 147  HDL Cholesterol >82.95 mg/dL 62.13  LDL (calc) 0 - 99 mg/dL 84  MICROALB/CREAT RATIO 0.0 - 30.0 mg/g 1.5  NonHDL  101.71  Triglycerides 0.0 - 149.0 mg/dL 08.6  VLDL  0.0 - 57.8 mg/dL 46.9     Latest Reference Range & Units 06/10/23 08:56  Creatinine,U mg/dL 62.9  Microalb, Ur 0.0 - 1.9 mg/dL 0.9  MICROALB/CREAT RATIO 0.0 - 30.0 mg/g 1.5     ASSESSMENT / PLAN / RECOMMENDATIONS:   1) Type 2 Diabetes Mellitus, Optimally controlled, With Neuropathic complications - Most recent A1c of 6.7 %. Goal A1c < 7.0 %.    -His A1c remains optimal, but the patient has been noted with severe hyperglycemia postprandial with BG readings >300 mg/d -Intolerant to Ozempic  and Trulicity  due to severe constipation -He is intolerant to metformin  -Mounjaro  was not covered by his insurance last year, will try again today - He was provided with number for sample pens of Mounjaro  2.5 mg weekly - Cautioned against hypoglycemia with combination of glipizide  and GLP-1 agonist, patient to contact our office with hypoglycemia - He did develop adhesive rash to Dexcom only when used on the right arm, which is perplexing, as he does not have the same on the left arm.  Patient may use Flonase  at the area prior to applying Dexcom - Encourage calibration when first applying a new Dexcom sensor, which he typically does - We again discussed the importance of low-carb diet    MEDICATIONS: -Start Mounjaro  2.5 mg weekly for 4 weeks, then increase to 5 mg weekly - Continue glipizide  5 mg, 1 tablet before breakfast 1 tablet before supper - Continue Farxiga  10 mg tablets - Continue Pioglitazone  45 mg daily   EDUCATION / INSTRUCTIONS: BG monitoring instructions: Patient is instructed to check his blood sugars 1 times a day. Call [facility] Endocrinology clinic  if: BG persistently < 70  I reviewed the Rule of 15 for the treatment of hypoglycemia in detail with the patient. Literature supplied.    F/U in 4 months   Signed electronically by: Natale Bail, MD  [facility] Endocrinology  [facility] [address] [place], [place] [address] Phone:  [phone] FAX: [phone]   CC: Scherrie Curt, MD [address] [place] [address] Phone: [phone]  Fax: [phone]  Return to Endocrinology clinic as below: No future appointments.

## 2023-09-23 NOTE — Patient Instructions (Addendum)
-   Start Mounjaro  2.5 mg weekly for 4 weeks, then increase to 5 mg weekly - Continue Glipizide  5 mg , 1 tablet before Breakfast and 1 tablet before Supper  - Continue Farxiga  10 mg tablets - Continue Pioglitazone  45 mg daily        HOW TO TREAT LOW BLOOD SUGARS (Blood sugar LESS THAN 70 MG/DL) Please follow the RULE OF 15 for the treatment of hypoglycemia treatment (when your (blood sugars are less than 70 mg/dL)   STEP 1: Take 15 grams of carbohydrates when your blood sugar is low, which includes:  3-4 GLUCOSE TABS  OR 3-4 OZ OF JUICE OR REGULAR SODA OR ONE TUBE OF GLUCOSE GEL    STEP 2: RECHECK blood sugar in 15 MINUTES STEP 3: If your blood sugar is still low at the 15 minute recheck --> then, go back to STEP 1 and treat AGAIN with another 15 grams of carbohydrates.

## 2023-09-25 ENCOUNTER — Encounter: Payer: Self-pay | Admitting: Internal Medicine

## 2023-09-25 ENCOUNTER — Ambulatory Visit (INDEPENDENT_AMBULATORY_CARE_PROVIDER_SITE_OTHER): Admitting: Internal Medicine

## 2023-09-25 VITALS — BP 112/72 | HR 74 | Temp 98.7°F | Ht 68.5 in | Wt 226.0 lb

## 2023-09-25 DIAGNOSIS — J0101 Acute recurrent maxillary sinusitis: Secondary | ICD-10-CM | POA: Diagnosis not present

## 2023-09-25 MED ORDER — AMOXICILLIN-POT CLAVULANATE 875-125 MG PO TABS: 1.0000 | ORAL_TABLET | Freq: Two times a day (BID) | ORAL | 1 refills | Status: DC

## 2023-09-25 NOTE — Progress Notes (Signed)
 Subjective:    Patient ID: Lonnie Jones, male    DOB: 31-Oct-1975, 48 y.o.   MRN: [id number]  HPI Here due to respiratory illness  Sinus symptoms again Going on for weeks---with the pollen Now with ear pain and popping Coughing up yellow and green phlegm--and coming from his nose Maxillary pressure Hasn't used the albuterol --but has feeling of breathing not right  Astelin , flonase , clarinex , xyzal  and singulair   Current Outpatient Medications on File Prior to Visit  Medication Sig Dispense Refill   albuterol  (VENTOLIN  HFA) 108 (90 Base) MCG/ACT inhaler Inhale 2 puffs into the lungs every 4 (four) to 6 (six)  hours as needed for cough/wheeze 6.7 g 0   azelastine  (ASTELIN ) 0.1 % nasal spray Place 2 sprays into both nostrils 2 (two) times daily. 30 mL 5   Blood Glucose Monitoring Suppl (CONTOUR NEXT EZ) w/Device KIT Check blood sugar 2 times daily as needed 1 kit 0   Continuous Glucose Sensor (DEXCOM G7 SENSOR) MISC Use 1 sensor as directed. 9 each 1   dapagliflozin  propanediol (FARXIGA ) 10 MG TABS tablet Take 1 tablet (10 mg total) by mouth daily. 90 tablet 3   desloratadine  (CLARINEX ) 5 MG tablet Take 1 tablet (5 mg total) by mouth daily. 30 tablet 3   diclofenac  (VOLTAREN ) 75 MG EC tablet Take 1 tablet (75 mg total) by mouth 2 (two) times daily. 180 tablet 3   eszopiclone  (LUNESTA ) 1 MG TABS tablet TAKE 1 TABLET BY MOUTH ONCE DAILY AT  BEDTIME  AS  NEEDED  FOR  SLEEP.  TAKE  IMMEDIATELY  BEFORE  BEDTIME 30 tablet 5   fluticasone  (FLONASE ) 50 MCG/ACT nasal spray Place 1 spray into both nostrils daily. 16 g 5   gabapentin  (NEURONTIN ) 400 MG capsule Take 1 capsule by mouth twice daily 180 capsule 3   glipiZIDE  (GLUCOTROL ) 5 MG tablet Take 1 tablet (5 mg total) by mouth 2 (two) times daily before a meal. 180 tablet 3   glucose blood (CONTOUR NEXT TEST) test strip Check blood sugar 2 times daily as needed 100 each 12   ipratropium (ATROVENT ) 0.06 % nasal spray Place 2 sprays into the  nose 3 (three) times daily as needed. 15 mL 3   levocetirizine (XYZAL ) 5 MG tablet Take 1 tablet (5 mg total) by mouth every evening. 90 tablet 3   meloxicam  (MOBIC ) 15 MG tablet Take 1 tablet by mouth once daily 90 tablet 3   montelukast  (SINGULAIR ) 10 MG tablet Take 1 tablet (10 mg total) by mouth daily. 30 tablet 3   pioglitazone  (ACTOS ) 45 MG tablet Take 1 tablet (45 mg total) by mouth daily. 90 tablet 3   pravastatin  (PRAVACHOL ) 20 MG tablet Take 1 tablet (20 mg total) by mouth daily. 90 tablet 3   tirzepatide  (MOUNJARO ) 5 MG/0.5ML Pen Inject 5 mg into the skin once a week. 6 mL 3   tiZANidine  (ZANAFLEX ) 4 MG tablet TAKE 1 TABLET BY MOUTH AT BEDTIME AS NEEDED FOR MUSCLE SPASM 90 tablet 3   No current facility-administered medications on file prior to visit.    Allergies  Allergen Reactions   Codeine Itching    Past Medical History:  Diagnosis Date   Acute idiopathic gout involving toe 06/22/2017   Allergic rhinitis due to allergen 08/25/2011   Asthma    GERD (gastroesophageal reflux disease)    Hyperlipidemia LDL goal < 70 12/16/2010   Migraine headache    Type 2 diabetes mellitus with peripheral neuropathy (HCC) 08/08/2010  Past Surgical History:  Procedure Laterality Date   ANKLE SURGERY     APPENDECTOMY  06/02/1998   CARPAL TUNNEL RELEASE     GANGLION CYST EXCISION     KNEE ARTHROSCOPY W/ ACL RECONSTRUCTION AND PATELLA GRAFT     SHOULDER SURGERY Left     Family History  Problem Relation Age of Onset   Healthy Mother    Healthy Father    Heart disease Maternal Grandmother    ADD / ADHD Son    ADD / ADHD Son    Colon cancer Neg Hx    Colon polyps Neg Hx    Esophageal cancer Neg Hx    Stomach cancer Neg Hx    Rectal cancer Neg Hx     Social History   Socioeconomic History   Marital status: Married    Spouse name: Not on file   Number of children: 2   Years of education: Not on file   Highest education level: 10th grade  Occupational History     Comment: Sewer  Tobacco Use   Smoking status: Former    Types: Cigarettes   Smokeless tobacco: Current    Types: Snuff  Substance and Sexual Activity   Alcohol use: Yes    Alcohol/week: 1.0 standard drink of alcohol    Types: 1 Standard drinks or equivalent per week   Drug use: No   Sexual activity: Yes    Birth control/protection: None    Comment: Married  Other Topics Concern   Not on file  Social History Narrative   Not on file   Social Drivers of Health   Financial Resource Strain: Low Risk  (06/06/2023)   Overall Financial Resource Strain (CARDIA)    Difficulty of Paying Living Expenses: Not hard at all  Food Insecurity: No Food Insecurity (06/06/2023)   Hunger Vital Sign    Worried About Running Out of Food in the Last Year: Never true    Ran Out of Food in the Last Year: Never true  Transportation Needs: No Transportation Needs (06/06/2023)   PRAPARE - Administrator, Civil Service (Medical): No    Lack of Transportation (Non-Medical): No  Physical Activity: Sufficiently Active (06/06/2023)   Exercise Vital Sign    Days of Exercise per Week: 7 days    Minutes of Exercise per Session: 150+ min  Stress: Stress Concern Present (06/06/2023)   [employer] of Occupational Health - Occupational Stress Questionnaire    Feeling of Stress : Rather much  Social Connections: Socially Isolated (06/06/2023)   Social Connection and Isolation Panel [NHANES]    Frequency of Communication with Friends and Family: Once a week    Frequency of Social Gatherings with Friends and Family: Once a week    Attends Religious Services: Never    Database administrator or Organizations: No    Attends Engineer, structural: Not on file    Marital Status: Married  Catering manager Violence: Not on file   Review of Systems No N/V Eating okay 3 COVID tests negative--but had it about a month ago     Objective:   Physical Exam Constitutional:      Appearance: Normal  appearance.  HENT:     Head:     Comments: Right maxillary tenderness    Right Ear: Tympanic membrane and ear canal normal.     Left Ear: Tympanic membrane and ear canal normal.     Mouth/Throat:     Comments: 2+ tonsils  but not injected/inflamed Pulmonary:     Effort: Pulmonary effort is normal.     Breath sounds: Normal breath sounds. No wheezing or rales.  Musculoskeletal:     Cervical back: Neck supple.  Lymphadenopathy:     Cervical: No cervical adenopathy.  Neurological:     Mental Status: He is alert.            Assessment & Plan:

## 2023-09-25 NOTE — Assessment & Plan Note (Signed)
 Will go ahead with the augmentin  875 bid for a week again Continue allergy regimen Hold off on steroids unless not clearing up

## 2023-09-29 ENCOUNTER — Telehealth: Payer: Self-pay

## 2023-09-29 NOTE — Telephone Encounter (Signed)
 Medication Samples have been provided to the patient.  Drug name: Mounjaro         Strength: 2.5mg          Qty: 1 box   LOT: [id number]  Exp.Date: 12/23/24  Dosing instructions: Inject once weekly   The patient has been instructed regarding the correct time, dose, and frequency of taking this medication, including desired effects and most common side effects. Patient was given sample on 09/23/23  Lonnie Jones L Maille Halliwell 9:31 AM 09/29/2023

## 2023-10-02 ENCOUNTER — Other Ambulatory Visit: Payer: Self-pay

## 2023-10-02 ENCOUNTER — Other Ambulatory Visit (HOSPITAL_COMMUNITY): Payer: Self-pay

## 2023-10-02 DIAGNOSIS — J301 Allergic rhinitis due to pollen: Secondary | ICD-10-CM | POA: Diagnosis not present

## 2023-10-02 DIAGNOSIS — J3089 Other allergic rhinitis: Secondary | ICD-10-CM | POA: Diagnosis not present

## 2023-10-02 DIAGNOSIS — J4520 Mild intermittent asthma, uncomplicated: Secondary | ICD-10-CM | POA: Diagnosis not present

## 2023-10-02 DIAGNOSIS — H1045 Other chronic allergic conjunctivitis: Secondary | ICD-10-CM | POA: Diagnosis not present

## 2023-10-02 MED ORDER — IPRATROPIUM BROMIDE 0.06 % NA SOLN
2.0000 | Freq: Three times a day (TID) | NASAL | 3 refills | Status: DC
  Filled 2023-10-02 (×2): qty 15, 25d supply, fill #0
  Filled 2023-11-15: qty 15, 25d supply, fill #1
  Filled 2023-12-06: qty 15, 13d supply, fill #2
  Filled 2023-12-28: qty 15, 25d supply, fill #2
  Filled 2024-01-18: qty 15, 25d supply, fill #3

## 2023-10-02 MED ORDER — EPINEPHRINE 0.3 MG/0.3ML IJ SOAJ
0.3000 mg | INTRAMUSCULAR | 1 refills | Status: AC | PRN
Start: 2023-10-02 — End: ?
  Filled 2023-10-02: qty 2, 30d supply, fill #0
  Filled 2023-10-02: qty 2, 15d supply, fill #0

## 2023-10-02 MED ORDER — EPINEPHRINE 0.3 MG/0.3ML IJ SOAJ
0.3000 mg | INTRAMUSCULAR | 1 refills | Status: DC | PRN
  Filled 2023-10-02: qty 2, 2d supply, fill #0
  Filled 2023-10-05: qty 2, fill #0

## 2023-10-02 MED ORDER — DOXYCYCLINE HYCLATE 100 MG PO CAPS
100.0000 mg | ORAL_CAPSULE | Freq: Two times a day (BID) | ORAL | 0 refills | Status: DC
Start: 2023-10-02 — End: 2024-03-07
  Filled 2023-10-02 (×2): qty 20, 10d supply, fill #0

## 2023-10-02 MED ORDER — QVAR REDIHALER 40 MCG/ACT IN AERB
1.0000 | INHALATION_SPRAY | Freq: Two times a day (BID) | RESPIRATORY_TRACT | 5 refills | Status: AC
  Filled 2023-10-02: qty 10.6, 60d supply, fill #0
  Filled 2023-10-02: qty 10.6, 30d supply, fill #0
  Filled 2023-11-15: qty 10.6, 30d supply, fill #1
  Filled 2024-03-06: qty 10.6, 30d supply, fill #2
  Filled 2024-06-14: qty 10.6, 30d supply, fill #3

## 2023-10-05 ENCOUNTER — Other Ambulatory Visit: Payer: Self-pay

## 2023-10-06 ENCOUNTER — Other Ambulatory Visit: Payer: Self-pay

## 2023-10-06 ENCOUNTER — Other Ambulatory Visit (HOSPITAL_COMMUNITY): Payer: Self-pay

## 2023-10-13 ENCOUNTER — Other Ambulatory Visit: Payer: Self-pay

## 2023-10-13 ENCOUNTER — Other Ambulatory Visit (HOSPITAL_COMMUNITY): Payer: Self-pay

## 2023-10-13 DIAGNOSIS — J301 Allergic rhinitis due to pollen: Secondary | ICD-10-CM | POA: Diagnosis not present

## 2023-10-14 ENCOUNTER — Other Ambulatory Visit (HOSPITAL_COMMUNITY): Payer: Self-pay

## 2023-10-14 ENCOUNTER — Other Ambulatory Visit: Payer: Self-pay

## 2023-10-14 DIAGNOSIS — J3089 Other allergic rhinitis: Secondary | ICD-10-CM | POA: Diagnosis not present

## 2023-10-20 DIAGNOSIS — J3089 Other allergic rhinitis: Secondary | ICD-10-CM | POA: Diagnosis not present

## 2023-10-20 DIAGNOSIS — J301 Allergic rhinitis due to pollen: Secondary | ICD-10-CM | POA: Diagnosis not present

## 2023-10-20 DIAGNOSIS — J3081 Allergic rhinitis due to animal (cat) (dog) hair and dander: Secondary | ICD-10-CM | POA: Diagnosis not present

## 2023-10-27 ENCOUNTER — Other Ambulatory Visit (HOSPITAL_COMMUNITY): Payer: Self-pay

## 2023-10-27 ENCOUNTER — Telehealth: Payer: Self-pay | Admitting: Pharmacy Technician

## 2023-10-27 NOTE — Telephone Encounter (Signed)
 Pharmacy Patient Advocate Encounter   Received notification from CoverMyMeds that prior authorization for Mounjaro  5MG /0.5ML auto-injectors is due for renewal.   Insurance verification completed.   The patient is insured through [facility].  Action: Medication is now available without a prior authorization.

## 2023-10-29 DIAGNOSIS — J301 Allergic rhinitis due to pollen: Secondary | ICD-10-CM | POA: Diagnosis not present

## 2023-10-29 DIAGNOSIS — J3089 Other allergic rhinitis: Secondary | ICD-10-CM | POA: Diagnosis not present

## 2023-10-29 DIAGNOSIS — J3081 Allergic rhinitis due to animal (cat) (dog) hair and dander: Secondary | ICD-10-CM | POA: Diagnosis not present

## 2023-11-03 DIAGNOSIS — J3081 Allergic rhinitis due to animal (cat) (dog) hair and dander: Secondary | ICD-10-CM | POA: Diagnosis not present

## 2023-11-03 DIAGNOSIS — J301 Allergic rhinitis due to pollen: Secondary | ICD-10-CM | POA: Diagnosis not present

## 2023-11-03 DIAGNOSIS — J3089 Other allergic rhinitis: Secondary | ICD-10-CM | POA: Diagnosis not present

## 2023-11-05 DIAGNOSIS — J3089 Other allergic rhinitis: Secondary | ICD-10-CM | POA: Diagnosis not present

## 2023-11-05 DIAGNOSIS — J301 Allergic rhinitis due to pollen: Secondary | ICD-10-CM | POA: Diagnosis not present

## 2023-11-05 DIAGNOSIS — J3081 Allergic rhinitis due to animal (cat) (dog) hair and dander: Secondary | ICD-10-CM | POA: Diagnosis not present

## 2023-11-10 DIAGNOSIS — J3089 Other allergic rhinitis: Secondary | ICD-10-CM | POA: Diagnosis not present

## 2023-11-10 DIAGNOSIS — J301 Allergic rhinitis due to pollen: Secondary | ICD-10-CM | POA: Diagnosis not present

## 2023-11-10 DIAGNOSIS — J3081 Allergic rhinitis due to animal (cat) (dog) hair and dander: Secondary | ICD-10-CM | POA: Diagnosis not present

## 2023-11-12 DIAGNOSIS — J301 Allergic rhinitis due to pollen: Secondary | ICD-10-CM | POA: Diagnosis not present

## 2023-11-12 DIAGNOSIS — J3081 Allergic rhinitis due to animal (cat) (dog) hair and dander: Secondary | ICD-10-CM | POA: Diagnosis not present

## 2023-11-12 DIAGNOSIS — J3089 Other allergic rhinitis: Secondary | ICD-10-CM | POA: Diagnosis not present

## 2023-11-16 ENCOUNTER — Other Ambulatory Visit (HOSPITAL_COMMUNITY): Payer: Self-pay

## 2023-11-16 ENCOUNTER — Other Ambulatory Visit: Payer: Self-pay

## 2023-11-17 DIAGNOSIS — J3081 Allergic rhinitis due to animal (cat) (dog) hair and dander: Secondary | ICD-10-CM | POA: Diagnosis not present

## 2023-11-17 DIAGNOSIS — J3089 Other allergic rhinitis: Secondary | ICD-10-CM | POA: Diagnosis not present

## 2023-11-17 DIAGNOSIS — J301 Allergic rhinitis due to pollen: Secondary | ICD-10-CM | POA: Diagnosis not present

## 2023-11-24 ENCOUNTER — Encounter: Payer: Self-pay | Admitting: Pharmacist

## 2023-11-24 ENCOUNTER — Other Ambulatory Visit (HOSPITAL_COMMUNITY): Payer: Self-pay

## 2023-11-24 ENCOUNTER — Other Ambulatory Visit: Payer: Self-pay

## 2023-11-24 DIAGNOSIS — J3081 Allergic rhinitis due to animal (cat) (dog) hair and dander: Secondary | ICD-10-CM | POA: Diagnosis not present

## 2023-11-24 DIAGNOSIS — J301 Allergic rhinitis due to pollen: Secondary | ICD-10-CM | POA: Diagnosis not present

## 2023-11-24 DIAGNOSIS — J3089 Other allergic rhinitis: Secondary | ICD-10-CM | POA: Diagnosis not present

## 2023-11-25 ENCOUNTER — Other Ambulatory Visit (HOSPITAL_COMMUNITY): Payer: Self-pay

## 2023-11-25 MED ORDER — GABAPENTIN 400 MG PO CAPS
400.0000 mg | ORAL_CAPSULE | Freq: Two times a day (BID) | ORAL | 3 refills | Status: DC
  Filled 2023-11-25 – 2023-12-11 (×3): qty 180, 90d supply, fill #0
  Filled 2024-03-06: qty 180, 90d supply, fill #1

## 2023-11-25 MED ORDER — DICLOFENAC SODIUM 75 MG PO TBEC
75.0000 mg | DELAYED_RELEASE_TABLET | Freq: Two times a day (BID) | ORAL | 3 refills | Status: DC
  Filled 2023-11-25 – 2023-12-11 (×3): qty 180, 90d supply, fill #0
  Filled 2024-03-28: qty 180, 90d supply, fill #1

## 2023-11-26 ENCOUNTER — Other Ambulatory Visit (HOSPITAL_COMMUNITY): Payer: Self-pay

## 2023-11-26 DIAGNOSIS — J3081 Allergic rhinitis due to animal (cat) (dog) hair and dander: Secondary | ICD-10-CM | POA: Diagnosis not present

## 2023-11-26 DIAGNOSIS — J3089 Other allergic rhinitis: Secondary | ICD-10-CM | POA: Diagnosis not present

## 2023-11-26 DIAGNOSIS — J301 Allergic rhinitis due to pollen: Secondary | ICD-10-CM | POA: Diagnosis not present

## 2023-12-01 DIAGNOSIS — J301 Allergic rhinitis due to pollen: Secondary | ICD-10-CM | POA: Diagnosis not present

## 2023-12-01 DIAGNOSIS — J3081 Allergic rhinitis due to animal (cat) (dog) hair and dander: Secondary | ICD-10-CM | POA: Diagnosis not present

## 2023-12-01 DIAGNOSIS — J3089 Other allergic rhinitis: Secondary | ICD-10-CM | POA: Diagnosis not present

## 2023-12-02 ENCOUNTER — Other Ambulatory Visit (HOSPITAL_COMMUNITY): Payer: Self-pay

## 2023-12-06 ENCOUNTER — Other Ambulatory Visit (HOSPITAL_COMMUNITY): Payer: Self-pay

## 2023-12-07 ENCOUNTER — Other Ambulatory Visit: Payer: Self-pay | Admitting: Family Medicine

## 2023-12-07 ENCOUNTER — Other Ambulatory Visit: Payer: Self-pay

## 2023-12-07 ENCOUNTER — Other Ambulatory Visit (HOSPITAL_COMMUNITY): Payer: Self-pay

## 2023-12-07 MED ORDER — AZELASTINE HCL 137 MCG/SPRAY NA SOLN
2.0000 | Freq: Two times a day (BID) | NASAL | 5 refills | Status: DC
  Filled 2023-12-07: qty 30, 30d supply, fill #0
  Filled 2024-01-18: qty 30, 30d supply, fill #1
  Filled 2024-03-06: qty 30, 30d supply, fill #2

## 2023-12-07 MED ORDER — AZELASTINE HCL 137 MCG/SPRAY NA SOLN
NASAL | 5 refills | Status: DC
  Filled 2023-12-07: qty 30, 50d supply, fill #0

## 2023-12-08 ENCOUNTER — Other Ambulatory Visit: Payer: Self-pay

## 2023-12-08 ENCOUNTER — Encounter: Payer: Self-pay | Admitting: Pharmacist

## 2023-12-08 DIAGNOSIS — J3089 Other allergic rhinitis: Secondary | ICD-10-CM | POA: Diagnosis not present

## 2023-12-08 DIAGNOSIS — J301 Allergic rhinitis due to pollen: Secondary | ICD-10-CM | POA: Diagnosis not present

## 2023-12-08 DIAGNOSIS — J3081 Allergic rhinitis due to animal (cat) (dog) hair and dander: Secondary | ICD-10-CM | POA: Diagnosis not present

## 2023-12-11 DIAGNOSIS — J3089 Other allergic rhinitis: Secondary | ICD-10-CM | POA: Diagnosis not present

## 2023-12-11 DIAGNOSIS — J3081 Allergic rhinitis due to animal (cat) (dog) hair and dander: Secondary | ICD-10-CM | POA: Diagnosis not present

## 2023-12-11 DIAGNOSIS — J301 Allergic rhinitis due to pollen: Secondary | ICD-10-CM | POA: Diagnosis not present

## 2023-12-12 ENCOUNTER — Other Ambulatory Visit (HOSPITAL_COMMUNITY): Payer: Self-pay

## 2023-12-14 ENCOUNTER — Other Ambulatory Visit (HOSPITAL_COMMUNITY): Payer: Self-pay

## 2023-12-14 ENCOUNTER — Other Ambulatory Visit: Payer: Self-pay | Admitting: Family Medicine

## 2023-12-14 ENCOUNTER — Other Ambulatory Visit: Payer: Self-pay

## 2023-12-14 MED ORDER — ESZOPICLONE 1 MG PO TABS
1.0000 mg | ORAL_TABLET | Freq: Every evening | ORAL | 5 refills | Status: DC | PRN
  Filled 2023-12-14: qty 30, 30d supply, fill #0
  Filled 2024-01-13 (×2): qty 30, 30d supply, fill #1
  Filled 2024-02-08 – 2024-02-11 (×2): qty 30, 30d supply, fill #2
  Filled 2024-03-12: qty 30, 30d supply, fill #3
  Filled 2024-04-15: qty 30, 30d supply, fill #4
  Filled 2024-05-17: qty 30, 30d supply, fill #5

## 2023-12-14 NOTE — Telephone Encounter (Signed)
 Copied from CRM [id number]. Topic: Clinical - Medication Refill >> Dec 14, 2023  8:18 AM [place] M wrote: Medication: eszopiclone  (LUNESTA ) 1 MG TABS tablet  Has the patient contacted their pharmacy? Yes (Agent: If no, request that the patient contact the pharmacy for the refill. If patient does not wish to contact the pharmacy document the reason why and proceed with request.) (Agent: If yes, when and what did the pharmacy advise?)  This is the patient's preferred pharmacy:   [facility] [place] [facility] [address] [place] [address] Phone: [phone] Fax: [phone]  Is this the correct pharmacy for this prescription? Yes If no, delete pharmacy and type the correct one.   Has the prescription been filled recently? Yes  Is the patient out of the medication? Yes  Has the patient been seen for an appointment in the last year OR does the patient have an upcoming appointment? Yes  Can we respond through MyChart? Yes  Agent: Please be advised that Rx refills may take up to 3 business days. We ask that you follow-up with your pharmacy.

## 2023-12-14 NOTE — Telephone Encounter (Signed)
 Last office visit 09/25/23 with Dr. Letvak for sinusitis.  Last refilled 06/10/23 for #30 with 5 refills.  Next Appt: No future appointments with PCP.

## 2023-12-15 ENCOUNTER — Other Ambulatory Visit (HOSPITAL_COMMUNITY): Payer: Self-pay

## 2023-12-15 DIAGNOSIS — J3089 Other allergic rhinitis: Secondary | ICD-10-CM | POA: Diagnosis not present

## 2023-12-15 DIAGNOSIS — J301 Allergic rhinitis due to pollen: Secondary | ICD-10-CM | POA: Diagnosis not present

## 2023-12-15 DIAGNOSIS — J3081 Allergic rhinitis due to animal (cat) (dog) hair and dander: Secondary | ICD-10-CM | POA: Diagnosis not present

## 2023-12-15 MED ORDER — MONTELUKAST SODIUM 10 MG PO TABS
10.0000 mg | ORAL_TABLET | Freq: Every day | ORAL | 3 refills | Status: DC
  Filled 2024-01-18: qty 30, 30d supply, fill #0

## 2023-12-24 ENCOUNTER — Other Ambulatory Visit (HOSPITAL_COMMUNITY): Payer: Self-pay

## 2023-12-24 ENCOUNTER — Other Ambulatory Visit: Payer: Self-pay

## 2023-12-24 DIAGNOSIS — J3089 Other allergic rhinitis: Secondary | ICD-10-CM | POA: Diagnosis not present

## 2023-12-24 DIAGNOSIS — J3081 Allergic rhinitis due to animal (cat) (dog) hair and dander: Secondary | ICD-10-CM | POA: Diagnosis not present

## 2023-12-24 DIAGNOSIS — J301 Allergic rhinitis due to pollen: Secondary | ICD-10-CM | POA: Diagnosis not present

## 2023-12-24 MED ORDER — MONTELUKAST SODIUM 10 MG PO TABS
10.0000 mg | ORAL_TABLET | Freq: Every day | ORAL | 5 refills | Status: DC
  Filled 2023-12-24: qty 30, 30d supply, fill #0
  Filled 2024-01-19: qty 30, 30d supply, fill #1
  Filled 2024-02-16: qty 30, 30d supply, fill #2
  Filled 2024-03-06 – 2024-03-28 (×2): qty 30, 30d supply, fill #3
  Filled 2024-04-22 – 2024-04-30 (×2): qty 30, 30d supply, fill #4
  Filled 2024-05-29: qty 30, 30d supply, fill #5

## 2023-12-24 MED ORDER — AZELASTINE HCL 137 MCG/SPRAY NA SOLN
1.0000 | Freq: Two times a day (BID) | NASAL | 5 refills | Status: AC
  Filled 2024-01-18: qty 30, fill #0
  Filled 2024-06-14: qty 30, 30d supply, fill #0

## 2023-12-25 ENCOUNTER — Other Ambulatory Visit: Payer: Self-pay

## 2023-12-28 ENCOUNTER — Other Ambulatory Visit (HOSPITAL_COMMUNITY): Payer: Self-pay

## 2023-12-29 ENCOUNTER — Other Ambulatory Visit: Payer: Self-pay

## 2023-12-29 ENCOUNTER — Other Ambulatory Visit (HOSPITAL_COMMUNITY): Payer: Self-pay

## 2023-12-29 DIAGNOSIS — J3081 Allergic rhinitis due to animal (cat) (dog) hair and dander: Secondary | ICD-10-CM | POA: Diagnosis not present

## 2023-12-29 DIAGNOSIS — J3089 Other allergic rhinitis: Secondary | ICD-10-CM | POA: Diagnosis not present

## 2023-12-29 DIAGNOSIS — J301 Allergic rhinitis due to pollen: Secondary | ICD-10-CM | POA: Diagnosis not present

## 2023-12-29 MED ORDER — DESLORATADINE 5 MG PO TABS
5.0000 mg | ORAL_TABLET | Freq: Every day | ORAL | 3 refills | Status: DC
  Filled 2023-12-29 – 2024-01-18 (×4): qty 30, 30d supply, fill #0
  Filled 2024-02-16: qty 30, 30d supply, fill #1
  Filled 2024-03-28: qty 30, 30d supply, fill #2
  Filled 2024-04-22 – 2024-04-30 (×2): qty 30, 30d supply, fill #3

## 2023-12-30 ENCOUNTER — Other Ambulatory Visit: Payer: Self-pay

## 2023-12-30 ENCOUNTER — Other Ambulatory Visit (HOSPITAL_COMMUNITY): Payer: Self-pay

## 2023-12-31 ENCOUNTER — Other Ambulatory Visit (HOSPITAL_COMMUNITY): Payer: Self-pay

## 2024-01-01 ENCOUNTER — Other Ambulatory Visit (HOSPITAL_COMMUNITY): Payer: Self-pay

## 2024-01-01 DIAGNOSIS — J3089 Other allergic rhinitis: Secondary | ICD-10-CM | POA: Diagnosis not present

## 2024-01-01 DIAGNOSIS — J301 Allergic rhinitis due to pollen: Secondary | ICD-10-CM | POA: Diagnosis not present

## 2024-01-01 DIAGNOSIS — J3081 Allergic rhinitis due to animal (cat) (dog) hair and dander: Secondary | ICD-10-CM | POA: Diagnosis not present

## 2024-01-08 DIAGNOSIS — J3089 Other allergic rhinitis: Secondary | ICD-10-CM | POA: Diagnosis not present

## 2024-01-08 DIAGNOSIS — J301 Allergic rhinitis due to pollen: Secondary | ICD-10-CM | POA: Diagnosis not present

## 2024-01-08 DIAGNOSIS — J3081 Allergic rhinitis due to animal (cat) (dog) hair and dander: Secondary | ICD-10-CM | POA: Diagnosis not present

## 2024-01-12 DIAGNOSIS — J3081 Allergic rhinitis due to animal (cat) (dog) hair and dander: Secondary | ICD-10-CM | POA: Diagnosis not present

## 2024-01-12 DIAGNOSIS — J3089 Other allergic rhinitis: Secondary | ICD-10-CM | POA: Diagnosis not present

## 2024-01-12 DIAGNOSIS — J301 Allergic rhinitis due to pollen: Secondary | ICD-10-CM | POA: Diagnosis not present

## 2024-01-14 ENCOUNTER — Other Ambulatory Visit: Payer: Self-pay

## 2024-01-14 ENCOUNTER — Other Ambulatory Visit (HOSPITAL_COMMUNITY): Payer: Self-pay

## 2024-01-18 ENCOUNTER — Other Ambulatory Visit (HOSPITAL_COMMUNITY): Payer: Self-pay

## 2024-01-19 ENCOUNTER — Other Ambulatory Visit: Payer: Self-pay

## 2024-01-19 ENCOUNTER — Other Ambulatory Visit (HOSPITAL_COMMUNITY): Payer: Self-pay

## 2024-01-19 DIAGNOSIS — J3081 Allergic rhinitis due to animal (cat) (dog) hair and dander: Secondary | ICD-10-CM | POA: Diagnosis not present

## 2024-01-19 DIAGNOSIS — J3089 Other allergic rhinitis: Secondary | ICD-10-CM | POA: Diagnosis not present

## 2024-01-19 DIAGNOSIS — J301 Allergic rhinitis due to pollen: Secondary | ICD-10-CM | POA: Diagnosis not present

## 2024-01-19 MED ORDER — FLUTICASONE PROPIONATE 50 MCG/ACT NA SUSP
1.0000 | Freq: Every day | NASAL | 5 refills | Status: DC
  Filled 2024-01-19: qty 16, 30d supply, fill #0
  Filled 2024-03-06: qty 16, 30d supply, fill #1

## 2024-01-20 ENCOUNTER — Other Ambulatory Visit: Payer: Self-pay

## 2024-01-25 ENCOUNTER — Other Ambulatory Visit: Payer: Self-pay | Admitting: Internal Medicine

## 2024-01-26 DIAGNOSIS — J3089 Other allergic rhinitis: Secondary | ICD-10-CM | POA: Diagnosis not present

## 2024-01-26 DIAGNOSIS — J301 Allergic rhinitis due to pollen: Secondary | ICD-10-CM | POA: Diagnosis not present

## 2024-01-27 ENCOUNTER — Ambulatory Visit: Admitting: Internal Medicine

## 2024-01-27 ENCOUNTER — Other Ambulatory Visit (HOSPITAL_COMMUNITY): Payer: Self-pay

## 2024-01-27 ENCOUNTER — Encounter: Payer: Self-pay | Admitting: Internal Medicine

## 2024-01-27 VITALS — BP 120/80 | HR 55 | Ht 68.5 in | Wt 207.0 lb

## 2024-01-27 DIAGNOSIS — Z7985 Long-term (current) use of injectable non-insulin antidiabetic drugs: Secondary | ICD-10-CM

## 2024-01-27 DIAGNOSIS — E1142 Type 2 diabetes mellitus with diabetic polyneuropathy: Secondary | ICD-10-CM | POA: Diagnosis not present

## 2024-01-27 DIAGNOSIS — Z7984 Long term (current) use of oral hypoglycemic drugs: Secondary | ICD-10-CM

## 2024-01-27 LAB — POCT GLYCOSYLATED HEMOGLOBIN (HGB A1C): Hemoglobin A1C: 5.4 % (ref 4.0–5.6)

## 2024-01-27 MED ORDER — TIRZEPATIDE 5 MG/0.5ML ~~LOC~~ SOAJ
5.0000 mg | SUBCUTANEOUS | 3 refills | Status: DC
  Filled 2024-01-27: qty 6, 84d supply, fill #0

## 2024-01-27 MED ORDER — DAPAGLIFLOZIN PROPANEDIOL 10 MG PO TABS
10.0000 mg | ORAL_TABLET | Freq: Every day | ORAL | 3 refills | Status: AC
  Filled 2024-01-27 – 2024-03-28 (×2): qty 90, 90d supply, fill #0
  Filled 2024-07-04: qty 90, 90d supply, fill #1

## 2024-01-27 MED ORDER — DEXCOM G7 SENSOR MISC
1.0000 | 3 refills | Status: DC
  Filled 2024-01-27: qty 9, 90d supply, fill #0

## 2024-01-27 NOTE — Patient Instructions (Addendum)
-   Continue Mounjaro   5 mg weekly - Continue Farxiga  10 mg tablets - Stop pioglitazone  45 mg daily        HOW TO TREAT LOW BLOOD SUGARS (Blood sugar LESS THAN 70 MG/DL) Please follow the RULE OF 15 for the treatment of hypoglycemia treatment (when your (blood sugars are less than 70 mg/dL)   STEP 1: Take 15 grams of carbohydrates when your blood sugar is low, which includes:  3-4 GLUCOSE TABS  OR 3-4 OZ OF JUICE OR REGULAR SODA OR ONE TUBE OF GLUCOSE GEL    STEP 2: RECHECK blood sugar in 15 MINUTES STEP 3: If your blood sugar is still low at the 15 minute recheck --> then, go back to STEP 1 and treat AGAIN with another 15 grams of carbohydrates.

## 2024-01-27 NOTE — Progress Notes (Signed)
 Name: Lonnie Jones  Age/ Sex: 48 y.o., male   MRN/ DOB: [id number], 03-13-1976     PCP: Watt Mirza, MD   Reason for Endocrinology Evaluation: Type 2 Diabetes Mellitus  Initial Endocrine Consultative Visit: 05/25/2019    PATIENT IDENTIFIER: Lonnie Jones is a 48 y.o. male with a past medical history of T2DM, Asthma,and  Dyslipidemia. The patient has followed with Endocrinology clinic since 05/25/2019 for consultative assistance with management of his diabetes.  DIABETIC HISTORY:  Lonnie Jones was diagnosed with T2DM in 2016, he has been on oral glycemic agents since his diagnosis. No prior use of insulin. His hemoglobin A1c has ranged from  6.9% in 2019, peaking at 8.5% in 07/2017.  On his initial visit to our clinic his A1c was 7.5%. He was on metformin , glipizide  and pioglitazone . We reduced Metformin  due to GI side effects and started farxiga .    Metformin  stopped by 05/2021 due to GI side effects and started Ozempic   Glipizide  stopped in April 2023 with an A1c of 6.6% to prevent hypoglycemia  Works in sewer and drain cleaning  Ozempic  and Trulicity  caused severe constipation and started glimepiride  01/2023   Switch glimepiride  to glipizide  03/2023 due to postprandial hyperglycemia at supper  Started Mounjaro  April, 2025  Stopped Glipizide  due to hypoglycemia 11/2023  SUBJECTIVE:   During the last visit (09/23/2023): A1c 6.7 %.     Today (01/27/2024): Lonnie Jones is here for a follow up on diabetes management.  He checks his blood sugars a few times a day through the dexcom G7 .The patient has not had hypoglycemic episodes since the last clinic visit.   Patient follows with [facility] for allergic rhinitis, on immunotherapy  Constipation  is better on mounjaro  than Ozempic   Denies nausea or vomiting  Has chronic LE swelling  He has been noted with weight loss ~ 20lbs  Patient continues with variable ankle and leg pain  HOME DIABETES  REGIMEN:  Farxiga  10 mg daily  Pioglitazone  45 mg daily  Glipizide  5 mg, twice daily- not taking  Mounjaro  5 mg weekly    Statin: Yes ACE-I/ARB: Yes  CONTINUOUS GLUCOSE MONITORING RECORD INTERPRETATION    Dates of Recording: 8/14-8/27/2025  Sensor description:dexcom  Results statistics:   CGM use % of time 94  Average and SD 148/30  Time in range 87 %  % Time Above 180 12  % Time above 250 1  % Time Below target 0   Glycemic patterns summary: BGs are optimal throughout the night and during the day Hyperglycemic episodes postprandial  Hypoglycemic episodes occurred n/a  Overnight periods: Optimal   DIABETIC COMPLICATIONS: Microvascular complications:  Neuropathy Denies: CKD, retinopathy  Last eye exam: Completed 05/11/2023   Macrovascular complications:  Denies: CAD, PVD, CVA      HISTORY:  Past Medical History:  Past Medical History:  Diagnosis Date   Acute idiopathic gout involving toe 06/22/2017   Allergic rhinitis due to allergen 08/25/2011   Asthma    GERD (gastroesophageal reflux disease)    Hyperlipidemia LDL goal < 70 12/16/2010   Migraine headache    Type 2 diabetes mellitus with peripheral neuropathy (HCC) 08/08/2010   Past Surgical History:  Past Surgical History:  Procedure Laterality Date   ANKLE SURGERY     APPENDECTOMY  06/02/1998   CARPAL TUNNEL RELEASE     GANGLION CYST EXCISION     KNEE ARTHROSCOPY W/ ACL RECONSTRUCTION AND PATELLA GRAFT     SHOULDER SURGERY  Left    Social History:  reports that he has quit smoking. His smoking use included cigarettes. His smokeless tobacco use includes snuff. He reports current alcohol use of about 1.0 standard drink of alcohol per week. He reports that he does not use drugs. Family History:  Family History  Problem Relation Age of Onset   Healthy Mother    Healthy Father    Heart disease Maternal Grandmother    ADD / ADHD Son    ADD / ADHD Son    Colon cancer Neg Hx    Colon polyps Neg  Hx    Esophageal cancer Neg Hx    Stomach cancer Neg Hx    Rectal cancer Neg Hx      HOME MEDICATIONS: Allergies as of 01/27/2024       Reactions   Codeine Itching        Medication List        Accurate as of January 27, 2024  8:34 AM. If you have any questions, ask your nurse or doctor.          albuterol  108 (90 Base) MCG/ACT inhaler Commonly known as: VENTOLIN  HFA Inhale 2 puffs into the lungs every 4 (four) to 6 (six)  hours as needed for cough/wheeze   amoxicillin -clavulanate 875-125 MG tablet Commonly known as: AUGMENTIN  Take 1 tablet by mouth 2 (two) times daily.   Azelastine  HCl 137 MCG/SPRAY Soln Place 2 sprays into both nostrils 2 (two) times daily.   Azelastine  HCl 137 MCG/SPRAY Soln Place 1 spray into both nostrils 2 (two) times daily.   Contour Next EZ w/Device Kit Check blood sugar 2 times daily as needed   Contour Next Test test strip Generic drug: glucose blood Check blood sugar 2 times daily as needed   desloratadine  5 MG tablet Commonly known as: CLARINEX  Take 1 tablet (5 mg total) by mouth daily.   Dexcom G7 Sensor Misc Use 1 sensor as directed.   diclofenac  75 MG EC tablet Commonly known as: VOLTAREN  Take 1 tablet (75 mg total) by mouth 2 (two) times daily.   diclofenac  75 MG EC tablet Commonly known as: VOLTAREN  Take 1 tablet (75 mg total) by mouth 2 (two) times daily.   doxycycline  100 MG capsule Commonly known as: VIBRAMYCIN  Take 1 capsule (100 mg total) by mouth every 12 (twelve) hours for 10 days   EPINEPHrine  0.3 mg/0.3 mL Soaj injection Commonly known as: EPI-PEN Inject 0.3 mg into the muscle as needed as directed for systemic reaction   EPINEPHrine  0.3 mg/0.3 mL Soaj injection Commonly known as: EPI-PEN Inject 0.3 mg into the muscle as needed for systemic reaction.   eszopiclone  1 MG Tabs tablet Commonly known as: LUNESTA  TAKE 1 TABLET BY MOUTH ONCE DAILY AT  BEDTIME  AS  NEEDED  FOR  SLEEP.  TAKE  IMMEDIATELY   BEFORE  BEDTIME   Farxiga  10 MG Tabs tablet Generic drug: dapagliflozin  propanediol Take 1 tablet (10 mg total) by mouth daily.   fluticasone  50 MCG/ACT nasal spray Commonly known as: FLONASE  Place 1 spray into both nostrils daily.   fluticasone  50 MCG/ACT nasal spray Commonly known as: FLONASE  Place 1-2 sprays into both nostrils daily.   gabapentin  400 MG capsule Commonly known as: NEURONTIN  Take 1 capsule by mouth twice daily   gabapentin  400 MG capsule Commonly known as: NEURONTIN  Take 1 capsule (400 mg total) by mouth 2 (two) times daily.   glipiZIDE  5 MG tablet Commonly known as: GLUCOTROL  Take 1 tablet (  5 mg total) by mouth 2 (two) times daily before a meal.   ipratropium 0.06 % nasal spray Commonly known as: ATROVENT  Place 2 sprays into the nose 3 (three) times daily as needed.   ipratropium 0.06 % nasal spray Commonly known as: ATROVENT  Place 2 sprays into both nostrils 3 (three) times daily.   levocetirizine 5 MG tablet Commonly known as: XYZAL  Take 1 tablet (5 mg total) by mouth every evening.   meloxicam  15 MG tablet Commonly known as: MOBIC  Take 1 tablet by mouth once daily   montelukast  10 MG tablet Commonly known as: SINGULAIR  Take 1 tablet (10 mg total) by mouth daily.   Mounjaro  5 MG/0.5ML Pen Generic drug: tirzepatide  Inject 5 mg into the skin once a week.   pioglitazone  45 MG tablet Commonly known as: ACTOS  Take 1 tablet (45 mg total) by mouth daily.   pravastatin  20 MG tablet Commonly known as: PRAVACHOL  Take 1 tablet (20 mg total) by mouth daily.   Qvar  RediHaler 40 MCG/ACT inhaler Generic drug: beclomethasone Inhale 1 puff into the lungs 2 (two) times daily.   tiZANidine  4 MG tablet Commonly known as: ZANAFLEX  TAKE 1 TABLET BY MOUTH AT BEDTIME AS NEEDED FOR MUSCLE SPASM         OBJECTIVE:   Vital Signs:BP 120/80 (BP Location: Left Arm, Patient Position: Sitting, Cuff Size: Normal)   Pulse (!) 55   Ht 5' 8.5 (1.74 m)    Wt 207 lb (93.9 kg)   SpO2 98%   BMI 31.02 kg/m   Wt Readings from Last 3 Encounters:  01/27/24 207 lb (93.9 kg)  09/25/23 226 lb (102.5 kg)  09/23/23 227 lb (103 kg)     Exam: General: Pt appears well and is in NAD  Lungs: Clear with good BS bilat   Heart: RRR   Abdomen:  soft, nontender  Extremities: No pretibial edema.   Neuro: MS is good with appropriate affect, pt is alert and Ox3    DM foot exam: 01/27/2024 The skin of the feet is intact without sores or ulcerations. The pedal pulses are 1+ on right and 1+ on left. The sensation is intact to a screening 5.07, 10 gram monofilament bilaterally    DATA REVIEWED:  Lab Results  Component Value Date   HGBA1C 5.4 01/27/2024   HGBA1C 6.7 (A) 09/23/2023   HGBA1C 6.7 (A) 03/24/2023    Latest Reference Range & Units 06/10/23 08:56  Sodium 135 - 145 mEq/L 139  Potassium 3.5 - 5.1 mEq/L 5.1  Chloride 96 - 112 mEq/L 103  CO2 19 - 32 mEq/L 30  Glucose 70 - 99 mg/dL 826 (H)  BUN 6 - 23 mg/dL 28 (H)  Creatinine 9.59 - 1.50 mg/dL 9.30  Calcium 8.4 - 89.4 mg/dL 9.6  Alkaline Phosphatase 39 - 117 U/L 60  Albumin 3.5 - 5.2 g/dL 4.5  AST 0 - 37 U/L 16  ALT 0 - 53 U/L 25  Total Protein 6.0 - 8.3 g/dL 6.4  Bilirubin, Direct 0.0 - 0.3 mg/dL 0.2  Total Bilirubin 0.2 - 1.2 mg/dL 0.8  GFR >39.99 mL/min 110.55  Total CHOL/HDL Ratio  3  Cholesterol 0 - 200 mg/dL 856  HDL Cholesterol >60.99 mg/dL 58.29  LDL (calc) 0 - 99 mg/dL 84  MICROALB/CREAT RATIO 0.0 - 30.0 mg/g 1.5  NonHDL  101.71  Triglycerides 0.0 - 149.0 mg/dL 10.9  VLDL 0.0 - 59.9 mg/dL 82.1     Latest Reference Range & Units 06/10/23 08:56  Creatinine,U  mg/dL 37.6  Microalb, Ur 0.0 - 1.9 mg/dL 0.9  MICROALB/CREAT RATIO 0.0 - 30.0 mg/g 1.5     ASSESSMENT / PLAN / RECOMMENDATIONS:   1) Type 2 Diabetes Mellitus, Optimally controlled, With Neuropathic complications - Most recent A1c of 5.4 %. Goal A1c < 7.0 %.    -A1c is optimal -Intolerant to Ozempic  and  Trulicity  due to severe constipation -He is intolerant to metformin  -He self discontinued glipizide  due to hypoglycemia - I have recommended discontinuing pioglitazone , with an A1c of 5.4% - I did encourage the patient to start a multivitamin and strength training exercises  MEDICATIONS: - Continue Mounjaro   5 mg weekly - Continue Farxiga  10 mg tablets - STOP Pioglitazone  45 mg daily   EDUCATION / INSTRUCTIONS: BG monitoring instructions: Patient is instructed to check his blood sugars 1 times a day. Call [place] [facility] if: BG persistently < 70  I reviewed the Rule of 15 for the treatment of hypoglycemia in detail with the patient. Literature supplied.    F/U in 6 months   Signed electronically by: Stefano Redgie Butts, MD  [facility] Endocrinology  [facility] [address] [place], [place] [address] Phone: [phone] FAX: [phone]   CC: Watt Mirza, MD [address] [place] [address] Phone: [phone]  Fax: [phone]  Return to Endocrinology clinic as below: No future appointments.

## 2024-01-28 ENCOUNTER — Encounter: Payer: Self-pay | Admitting: Internal Medicine

## 2024-02-02 DIAGNOSIS — J301 Allergic rhinitis due to pollen: Secondary | ICD-10-CM | POA: Diagnosis not present

## 2024-02-02 DIAGNOSIS — J3089 Other allergic rhinitis: Secondary | ICD-10-CM | POA: Diagnosis not present

## 2024-02-02 DIAGNOSIS — J3081 Allergic rhinitis due to animal (cat) (dog) hair and dander: Secondary | ICD-10-CM | POA: Diagnosis not present

## 2024-02-09 ENCOUNTER — Other Ambulatory Visit (HOSPITAL_COMMUNITY): Payer: Self-pay

## 2024-02-09 ENCOUNTER — Other Ambulatory Visit: Payer: Self-pay

## 2024-02-09 DIAGNOSIS — J3081 Allergic rhinitis due to animal (cat) (dog) hair and dander: Secondary | ICD-10-CM | POA: Diagnosis not present

## 2024-02-09 DIAGNOSIS — J3089 Other allergic rhinitis: Secondary | ICD-10-CM | POA: Diagnosis not present

## 2024-02-09 DIAGNOSIS — M5459 Other low back pain: Secondary | ICD-10-CM | POA: Diagnosis not present

## 2024-02-09 DIAGNOSIS — J301 Allergic rhinitis due to pollen: Secondary | ICD-10-CM | POA: Diagnosis not present

## 2024-02-09 MED ORDER — CYCLOBENZAPRINE HCL 5 MG PO TABS
5.0000 mg | ORAL_TABLET | Freq: Two times a day (BID) | ORAL | 0 refills | Status: AC
  Filled 2024-02-09 (×2): qty 14, 7d supply, fill #0

## 2024-02-09 MED ORDER — MELOXICAM 15 MG PO TABS
15.0000 mg | ORAL_TABLET | Freq: Every day | ORAL | 0 refills | Status: AC
  Filled 2024-02-09 (×2): qty 10, 10d supply, fill #0

## 2024-02-11 ENCOUNTER — Other Ambulatory Visit: Payer: Self-pay

## 2024-02-16 ENCOUNTER — Other Ambulatory Visit (HOSPITAL_COMMUNITY): Payer: Self-pay

## 2024-02-16 DIAGNOSIS — J301 Allergic rhinitis due to pollen: Secondary | ICD-10-CM | POA: Diagnosis not present

## 2024-02-16 DIAGNOSIS — J3089 Other allergic rhinitis: Secondary | ICD-10-CM | POA: Diagnosis not present

## 2024-02-16 DIAGNOSIS — J3081 Allergic rhinitis due to animal (cat) (dog) hair and dander: Secondary | ICD-10-CM | POA: Diagnosis not present

## 2024-02-23 DIAGNOSIS — J3089 Other allergic rhinitis: Secondary | ICD-10-CM | POA: Diagnosis not present

## 2024-02-23 DIAGNOSIS — J301 Allergic rhinitis due to pollen: Secondary | ICD-10-CM | POA: Diagnosis not present

## 2024-02-23 DIAGNOSIS — J3081 Allergic rhinitis due to animal (cat) (dog) hair and dander: Secondary | ICD-10-CM | POA: Diagnosis not present

## 2024-03-03 DIAGNOSIS — J3081 Allergic rhinitis due to animal (cat) (dog) hair and dander: Secondary | ICD-10-CM | POA: Diagnosis not present

## 2024-03-03 DIAGNOSIS — J301 Allergic rhinitis due to pollen: Secondary | ICD-10-CM | POA: Diagnosis not present

## 2024-03-03 DIAGNOSIS — J3089 Other allergic rhinitis: Secondary | ICD-10-CM | POA: Diagnosis not present

## 2024-03-06 ENCOUNTER — Other Ambulatory Visit (HOSPITAL_COMMUNITY): Payer: Self-pay

## 2024-03-07 ENCOUNTER — Other Ambulatory Visit (HOSPITAL_COMMUNITY): Payer: Self-pay

## 2024-03-07 ENCOUNTER — Encounter: Payer: Self-pay | Admitting: Family Medicine

## 2024-03-07 ENCOUNTER — Other Ambulatory Visit: Payer: Self-pay

## 2024-03-07 MED ORDER — CEPHALEXIN 500 MG PO CAPS: 500.0000 mg | ORAL_CAPSULE | Freq: Three times a day (TID) | ORAL | 0 refills | Status: DC

## 2024-03-08 ENCOUNTER — Other Ambulatory Visit: Payer: Self-pay

## 2024-03-08 ENCOUNTER — Other Ambulatory Visit (HOSPITAL_COMMUNITY): Payer: Self-pay

## 2024-03-08 MED ORDER — FLUTICASONE PROPIONATE 50 MCG/ACT NA SUSP
1.0000 | Freq: Every day | NASAL | 0 refills | Status: AC
  Filled 2024-06-14: qty 16, 30d supply, fill #0

## 2024-03-08 MED ORDER — IPRATROPIUM BROMIDE 0.06 % NA SOLN
2.0000 | Freq: Three times a day (TID) | NASAL | 3 refills | Status: DC
  Filled 2024-03-08 (×2): qty 15, 25d supply, fill #0

## 2024-03-11 DIAGNOSIS — J301 Allergic rhinitis due to pollen: Secondary | ICD-10-CM | POA: Diagnosis not present

## 2024-03-11 DIAGNOSIS — J3089 Other allergic rhinitis: Secondary | ICD-10-CM | POA: Diagnosis not present

## 2024-03-11 DIAGNOSIS — J3081 Allergic rhinitis due to animal (cat) (dog) hair and dander: Secondary | ICD-10-CM | POA: Diagnosis not present

## 2024-03-12 NOTE — Progress Notes (Deleted)
 Lonnie Jones [name], MD, CAQ Sports Medicine [facility] at [facility] [address] [place], [address]  Phone: [phone]  FAX: [phone]  Lonnie Jones - 48 y.o. male  MRN [id number]  Date of Birth: 07/29/1975  Date: 03/14/2024  PCP: Lonnie Mirza, MD  Referral: Lonnie Mirza, MD  No chief complaint on file.  Subjective:   Lonnie Jones is a 48 y.o. very pleasant male patient with There is no height or weight on file to calculate BMI. who presents with the following:  Discussed the use of AI scribe software for clinical note transcription with the patient, who gave verbal consent to proceed.  Patient presents with a possible infected tattoo. History of Present Illness     Review of Systems is noted in the HPI, as appropriate  Objective:   There were no vitals taken for this visit.  GEN: No acute distress; alert,appropriate. PULM: Breathing comfortably in no respiratory distress PSYCH: Normally interactive.   Laboratory and Imaging Data:  Assessment and Plan:   No diagnosis found. Assessment & Plan   Medication Management during today's office visit: No orders of the defined types were placed in this encounter.  There are no discontinued medications.  Orders placed today for conditions managed today: No orders of the defined types were placed in this encounter.   Disposition: No follow-ups on file.  [employer] One speech-to-text software was used for transcription in this dictation.  Possible transcriptional errors can occur using Animal nutritionist.   Signed,  Lonnie Jones. Lonnie Hays, MD   Outpatient Encounter Medications as of 03/14/2024  Medication Sig   albuterol  (VENTOLIN  HFA) 108 (90 Base) MCG/ACT inhaler Inhale 2 puffs into the lungs every 4 (four) to 6 (six)  hours as needed for cough/wheeze   Azelastine  HCl 137 MCG/SPRAY SOLN Place 2 sprays into both nostrils 2 (two) times daily.   Azelastine  HCl 137  MCG/SPRAY SOLN Place 1 spray into both nostrils 2 (two) times daily.   beclomethasone (QVAR  REDIHALER) 40 MCG/ACT inhaler Inhale 1 puff into the lungs 2 (two) times daily.   Blood Glucose Monitoring Suppl (CONTOUR NEXT EZ) w/Device KIT Check blood sugar 2 times daily as needed   cephALEXin  (KEFLEX ) 500 MG capsule Take 1 capsule (500 mg total) by mouth 3 (three) times daily.   Continuous Glucose Sensor (DEXCOM G7 SENSOR) MISC Use as directed. Change every 10 days   dapagliflozin  propanediol (FARXIGA ) 10 MG TABS tablet Take 1 tablet (10 mg total) by mouth daily.   desloratadine  (CLARINEX ) 5 MG tablet Take 1 tablet (5 mg total) by mouth daily.   diclofenac  (VOLTAREN ) 75 MG EC tablet Take 1 tablet (75 mg total) by mouth 2 (two) times daily.   diclofenac  (VOLTAREN ) 75 MG EC tablet Take 1 tablet (75 mg total) by mouth 2 (two) times daily.   EPINEPHrine  0.3 mg/0.3 mL IJ SOAJ injection Inject 0.3 mg into the muscle as needed as directed for systemic reaction   EPINEPHrine  0.3 mg/0.3 mL IJ SOAJ injection Inject 0.3 mg into the muscle as needed for systemic reaction.   eszopiclone  (LUNESTA ) 1 MG TABS tablet TAKE 1 TABLET BY MOUTH ONCE DAILY AT  BEDTIME  AS  NEEDED  FOR  SLEEP.  TAKE  IMMEDIATELY  BEFORE  BEDTIME   fluticasone  (FLONASE ) 50 MCG/ACT nasal spray Place 1-2 sprays into both nostrils daily.   fluticasone  (FLONASE ) 50 MCG/ACT nasal spray Place 1 spray into both nostrils daily during seasons of difficulty.  gabapentin  (NEURONTIN ) 400 MG capsule Take 1 capsule by mouth twice daily   gabapentin  (NEURONTIN ) 400 MG capsule Take 1 capsule (400 mg total) by mouth 2 (two) times daily.   glucose blood (CONTOUR NEXT TEST) test strip Check blood sugar 2 times daily as needed   ipratropium (ATROVENT ) 0.06 % nasal spray Place 2 sprays into the nose 3 (three) times daily as needed.   ipratropium (ATROVENT ) 0.06 % nasal spray Place 2 sprays into both nostrils 3 (three) times daily.   levocetirizine (XYZAL ) 5 MG  tablet Take 1 tablet (5 mg total) by mouth every evening.   meloxicam  (MOBIC ) 15 MG tablet Take 1 tablet by mouth once daily   montelukast  (SINGULAIR ) 10 MG tablet Take 1 tablet (10 mg total) by mouth daily.   pravastatin  (PRAVACHOL ) 20 MG tablet Take 1 tablet (20 mg total) by mouth daily.   tirzepatide  (MOUNJARO ) 5 MG/0.5ML Pen Inject 5 mg into the skin once a week.   tiZANidine  (ZANAFLEX ) 4 MG tablet TAKE 1 TABLET BY MOUTH AT BEDTIME AS NEEDED FOR MUSCLE SPASM   No facility-administered encounter medications on file as of 03/14/2024.

## 2024-03-13 ENCOUNTER — Other Ambulatory Visit (HOSPITAL_COMMUNITY): Payer: Self-pay

## 2024-03-14 ENCOUNTER — Ambulatory Visit: Admitting: Family Medicine

## 2024-03-15 ENCOUNTER — Other Ambulatory Visit: Payer: Self-pay

## 2024-03-17 ENCOUNTER — Telehealth: Payer: Self-pay

## 2024-03-17 DIAGNOSIS — J301 Allergic rhinitis due to pollen: Secondary | ICD-10-CM | POA: Diagnosis not present

## 2024-03-17 DIAGNOSIS — J3089 Other allergic rhinitis: Secondary | ICD-10-CM | POA: Diagnosis not present

## 2024-03-17 DIAGNOSIS — J3081 Allergic rhinitis due to animal (cat) (dog) hair and dander: Secondary | ICD-10-CM | POA: Diagnosis not present

## 2024-03-17 NOTE — Telephone Encounter (Signed)
 Patient states that he has 2 pens left of the 5mg  dose of Mounjaro  and would like to have the next dose sent in.

## 2024-03-18 MED ORDER — TIRZEPATIDE 7.5 MG/0.5ML ~~LOC~~ SOAJ: 7.5000 mg | SUBCUTANEOUS | 3 refills | Status: AC

## 2024-03-24 DIAGNOSIS — J3081 Allergic rhinitis due to animal (cat) (dog) hair and dander: Secondary | ICD-10-CM | POA: Diagnosis not present

## 2024-03-24 DIAGNOSIS — J3089 Other allergic rhinitis: Secondary | ICD-10-CM | POA: Diagnosis not present

## 2024-03-24 DIAGNOSIS — J301 Allergic rhinitis due to pollen: Secondary | ICD-10-CM | POA: Diagnosis not present

## 2024-03-25 DIAGNOSIS — J3089 Other allergic rhinitis: Secondary | ICD-10-CM | POA: Diagnosis not present

## 2024-03-25 DIAGNOSIS — J3081 Allergic rhinitis due to animal (cat) (dog) hair and dander: Secondary | ICD-10-CM | POA: Diagnosis not present

## 2024-03-28 ENCOUNTER — Other Ambulatory Visit (HOSPITAL_COMMUNITY): Payer: Self-pay

## 2024-03-28 ENCOUNTER — Other Ambulatory Visit: Payer: Self-pay

## 2024-04-01 DIAGNOSIS — J3081 Allergic rhinitis due to animal (cat) (dog) hair and dander: Secondary | ICD-10-CM | POA: Diagnosis not present

## 2024-04-01 DIAGNOSIS — J301 Allergic rhinitis due to pollen: Secondary | ICD-10-CM | POA: Diagnosis not present

## 2024-04-01 DIAGNOSIS — J3089 Other allergic rhinitis: Secondary | ICD-10-CM | POA: Diagnosis not present

## 2024-04-14 DIAGNOSIS — J301 Allergic rhinitis due to pollen: Secondary | ICD-10-CM | POA: Diagnosis not present

## 2024-04-14 DIAGNOSIS — J3089 Other allergic rhinitis: Secondary | ICD-10-CM | POA: Diagnosis not present

## 2024-04-14 DIAGNOSIS — J3081 Allergic rhinitis due to animal (cat) (dog) hair and dander: Secondary | ICD-10-CM | POA: Diagnosis not present

## 2024-04-15 ENCOUNTER — Other Ambulatory Visit: Payer: Self-pay

## 2024-04-15 MED ORDER — MOUNJARO 7.5 MG/0.5ML ~~LOC~~ SOAJ
7.5000 mg | SUBCUTANEOUS | 3 refills | Status: DC
  Filled 2024-04-15: qty 2, 28d supply, fill #0
  Filled 2024-04-18: qty 6, 84d supply, fill #0
  Filled 2024-04-18: qty 6, 84d supply, fill #1

## 2024-04-16 ENCOUNTER — Other Ambulatory Visit (HOSPITAL_COMMUNITY): Payer: Self-pay

## 2024-04-18 ENCOUNTER — Other Ambulatory Visit (HOSPITAL_COMMUNITY): Payer: Self-pay

## 2024-04-18 ENCOUNTER — Other Ambulatory Visit: Payer: Self-pay

## 2024-04-18 ENCOUNTER — Other Ambulatory Visit: Payer: Self-pay | Admitting: Internal Medicine

## 2024-04-19 ENCOUNTER — Other Ambulatory Visit: Payer: Self-pay

## 2024-04-20 ENCOUNTER — Other Ambulatory Visit: Payer: Self-pay

## 2024-04-22 ENCOUNTER — Other Ambulatory Visit: Payer: Self-pay

## 2024-04-22 ENCOUNTER — Other Ambulatory Visit (HOSPITAL_COMMUNITY): Payer: Self-pay

## 2024-04-22 DIAGNOSIS — J301 Allergic rhinitis due to pollen: Secondary | ICD-10-CM | POA: Diagnosis not present

## 2024-04-22 DIAGNOSIS — J3089 Other allergic rhinitis: Secondary | ICD-10-CM | POA: Diagnosis not present

## 2024-04-22 DIAGNOSIS — J3081 Allergic rhinitis due to animal (cat) (dog) hair and dander: Secondary | ICD-10-CM | POA: Diagnosis not present

## 2024-04-27 ENCOUNTER — Other Ambulatory Visit: Payer: Self-pay

## 2024-04-30 ENCOUNTER — Other Ambulatory Visit (HOSPITAL_COMMUNITY): Payer: Self-pay

## 2024-05-03 ENCOUNTER — Other Ambulatory Visit: Payer: Self-pay

## 2024-05-04 ENCOUNTER — Other Ambulatory Visit (HOSPITAL_COMMUNITY): Payer: Self-pay

## 2024-05-05 DIAGNOSIS — J3089 Other allergic rhinitis: Secondary | ICD-10-CM | POA: Diagnosis not present

## 2024-05-05 DIAGNOSIS — J3081 Allergic rhinitis due to animal (cat) (dog) hair and dander: Secondary | ICD-10-CM | POA: Diagnosis not present

## 2024-05-05 DIAGNOSIS — J301 Allergic rhinitis due to pollen: Secondary | ICD-10-CM | POA: Diagnosis not present

## 2024-05-10 DIAGNOSIS — E119 Type 2 diabetes mellitus without complications: Secondary | ICD-10-CM | POA: Diagnosis not present

## 2024-05-13 DIAGNOSIS — J301 Allergic rhinitis due to pollen: Secondary | ICD-10-CM | POA: Diagnosis not present

## 2024-05-13 DIAGNOSIS — J3089 Other allergic rhinitis: Secondary | ICD-10-CM | POA: Diagnosis not present

## 2024-05-13 DIAGNOSIS — J3081 Allergic rhinitis due to animal (cat) (dog) hair and dander: Secondary | ICD-10-CM | POA: Diagnosis not present

## 2024-05-17 ENCOUNTER — Other Ambulatory Visit: Payer: Self-pay | Admitting: Family Medicine

## 2024-05-17 ENCOUNTER — Other Ambulatory Visit (HOSPITAL_COMMUNITY): Payer: Self-pay

## 2024-05-17 MED ORDER — DICLOFENAC SODIUM 75 MG PO TBEC
75.0000 mg | DELAYED_RELEASE_TABLET | Freq: Two times a day (BID) | ORAL | 3 refills | Status: AC
  Filled 2024-05-17 – 2024-07-04 (×2): qty 180, 90d supply, fill #0

## 2024-05-17 NOTE — Telephone Encounter (Signed)
 Last office visit 09/25/2023 with Dr. Letvak for sinusitis.  Last refilled 06/10/23 for #180 with 3 refills.  Next Appt: CPE 06/13/2024.

## 2024-05-17 NOTE — Telephone Encounter (Signed)
 Please make sure that he is not taking Meloxicam  at the same time as the Voltaren .  Both are on his problem list, and he can either one but not both.  Sending in Voltaren  refill.

## 2024-05-25 ENCOUNTER — Encounter: Payer: Self-pay | Admitting: Family Medicine

## 2024-05-25 MED ORDER — TIZANIDINE HCL 4 MG PO TABS: ORAL_TABLET | ORAL | 3 refills | Status: AC

## 2024-05-25 NOTE — Telephone Encounter (Signed)
 Last office visit 09/25/23 with Dr. Letvak for Sinusitis.  Last refilled 12/29/2024f or #90 with 3 refills.  Next appt: CPE 06/13/2024.

## 2024-05-29 ENCOUNTER — Other Ambulatory Visit (HOSPITAL_COMMUNITY): Payer: Self-pay

## 2024-06-09 ENCOUNTER — Other Ambulatory Visit: Payer: Self-pay | Admitting: Family Medicine

## 2024-06-09 DIAGNOSIS — Z125 Encounter for screening for malignant neoplasm of prostate: Secondary | ICD-10-CM

## 2024-06-09 DIAGNOSIS — E1142 Type 2 diabetes mellitus with diabetic polyneuropathy: Secondary | ICD-10-CM

## 2024-06-09 DIAGNOSIS — Z79899 Other long term (current) drug therapy: Secondary | ICD-10-CM

## 2024-06-09 DIAGNOSIS — E785 Hyperlipidemia, unspecified: Secondary | ICD-10-CM

## 2024-06-10 ENCOUNTER — Other Ambulatory Visit

## 2024-06-10 DIAGNOSIS — Z125 Encounter for screening for malignant neoplasm of prostate: Secondary | ICD-10-CM

## 2024-06-10 DIAGNOSIS — Z79899 Other long term (current) drug therapy: Secondary | ICD-10-CM

## 2024-06-10 DIAGNOSIS — E1142 Type 2 diabetes mellitus with diabetic polyneuropathy: Secondary | ICD-10-CM | POA: Diagnosis not present

## 2024-06-10 DIAGNOSIS — E785 Hyperlipidemia, unspecified: Secondary | ICD-10-CM

## 2024-06-10 LAB — CBC WITH DIFFERENTIAL/PLATELET
Basophils Absolute: 0 K/uL (ref 0.0–0.1)
Basophils Relative: 0.1 % (ref 0.0–3.0)
Eosinophils Absolute: 0 K/uL (ref 0.0–0.7)
Eosinophils Relative: 0.8 % (ref 0.0–5.0)
HCT: 51 % (ref 39.0–52.0)
Hemoglobin: 17.8 g/dL — ABNORMAL HIGH (ref 13.0–17.0)
Lymphocytes Relative: 26.9 % (ref 12.0–46.0)
Lymphs Abs: 1.2 K/uL (ref 0.7–4.0)
MCHC: 34.9 g/dL (ref 30.0–36.0)
MCV: 88.7 fl (ref 78.0–100.0)
Monocytes Absolute: 0.4 K/uL (ref 0.1–1.0)
Monocytes Relative: 8.1 % (ref 3.0–12.0)
Neutro Abs: 2.8 K/uL (ref 1.4–7.7)
Neutrophils Relative %: 64.1 % (ref 43.0–77.0)
Platelets: 208 K/uL (ref 150.0–400.0)
RBC: 5.75 Mil/uL (ref 4.22–5.81)
RDW: 12.8 % (ref 11.5–15.5)
WBC: 4.4 K/uL (ref 4.0–10.5)

## 2024-06-10 LAB — LIPID PANEL
Cholesterol: 131 mg/dL (ref 28–200)
HDL: 35.1 mg/dL — ABNORMAL LOW
LDL Cholesterol: 78 mg/dL (ref 10–99)
NonHDL: 96.27
Total CHOL/HDL Ratio: 4
Triglycerides: 90 mg/dL (ref 10.0–149.0)
VLDL: 18 mg/dL (ref 0.0–40.0)

## 2024-06-10 LAB — BASIC METABOLIC PANEL WITH GFR
BUN: 22 mg/dL (ref 6–23)
CO2: 28 meq/L (ref 19–32)
Calcium: 9.6 mg/dL (ref 8.4–10.5)
Chloride: 104 meq/L (ref 96–112)
Creatinine, Ser: 0.9 mg/dL (ref 0.40–1.50)
GFR: 101.31 mL/min
Glucose, Bld: 125 mg/dL — ABNORMAL HIGH (ref 70–99)
Potassium: 4.7 meq/L (ref 3.5–5.1)
Sodium: 138 meq/L (ref 135–145)

## 2024-06-10 LAB — HEPATIC FUNCTION PANEL
ALT: 28 U/L (ref 3–53)
AST: 17 U/L (ref 5–37)
Albumin: 4.6 g/dL (ref 3.5–5.2)
Alkaline Phosphatase: 51 U/L (ref 39–117)
Bilirubin, Direct: 0.2 mg/dL (ref 0.1–0.3)
Total Bilirubin: 1.1 mg/dL (ref 0.2–1.2)
Total Protein: 6.8 g/dL (ref 6.0–8.3)

## 2024-06-10 LAB — HEMOGLOBIN A1C: Hgb A1c MFr Bld: 5.7 % (ref 4.6–6.5)

## 2024-06-10 LAB — MICROALBUMIN / CREATININE URINE RATIO
Creatinine,U: 223.8 mg/dL
Microalb Creat Ratio: 8.2 mg/g (ref 0.0–30.0)
Microalb, Ur: 1.8 mg/dL (ref 0.7–1.9)

## 2024-06-12 NOTE — Progress Notes (Unsigned)
 "    Lonnie Lesage T. Donie Lemelin, MD, CAQ Sports Medicine [facility] at [facility] [address] [place], [address]  Phone: [phone]  FAX: [phone]  Lonnie Jones - 49 y.o. male  MRN [id number]  Date of Birth: [birth year]  Date: 06/13/2024  PCP: Watt Mirza, MD  Referral: Watt Mirza, MD  No chief complaint on file.  Patient Care Team: Watt Mirza, MD as PCP - General (Family Medicine) [facility], [place] Subjective:   Lonnie Jones is a 49 y.o. pleasant patient who presents with the following:  Discussed the use of AI scribe software for clinical note transcription with the patient, who gave verbal consent to proceed.  History of Present Illness     Preventative Health Maintenance Visit:  Health Maintenance Summary Reviewed and updated, unless pt declines services.  Tobacco History Reviewed. Alcohol: No concerns, no excessive use Exercise Habits: Some activity, rec at least 30 mins 5 times a week STD concerns: no risk or activity to increase risk Drug Use: None  Pleasant patient, known for many years who has a history of diabetes.  Flu Eye exam  Diabetes is well-controlled, currently on Mounjaro  7.5 milligrams.  He is also on Farxiga  10 mg.  Asthma, well-controlled on Qvar  40 mcg.  Health Maintenance  Topic Date Due   Hepatitis B Vaccines 19-59 Average Risk (1 of 3 - 19+ 3-dose series) Never done   Influenza Vaccine  01/01/2024   COVID-19 Vaccine (4 - 2025-26 season) 02/01/2024   OPHTHALMOLOGY EXAM  05/10/2024   HEMOGLOBIN A1C  12/08/2024   FOOT EXAM  01/26/2025   Colonoscopy  02/26/2025   Diabetic kidney evaluation - eGFR measurement  06/10/2025   Diabetic kidney evaluation - Urine ACR  06/10/2025   DTaP/Tdap/Td (3 - Td or Tdap) 06/09/2033   Pneumococcal Vaccine  Completed   Hepatitis C Screening  Completed   HIV Screening  Completed   HPV VACCINES  Aged Out   Meningococcal B Vaccine  Aged Out    Immunization History  Administered Date(s) Administered   INFLUENZA, HIGH DOSE SEASONAL PF 09/27/2019, 08/28/2020   Influenza, Seasonal, Injecte, Preservative Fre 07/28/2012, 06/10/2023   Influenza,inj,Quad PF,6+ Mos 01/26/2013, 02/15/2015, 05/12/2016, 02/24/2018, 02/11/2019, 04/02/2020, 04/10/2021, 04/17/2022   PFIZER(Purple Top)SARS-COV-2 Vaccination 08/26/2019, 09/19/2019   PNEUMOCOCCAL CONJUGATE-20 04/10/2021   Pneumococcal Polysaccharide-23 07/28/2012, 08/24/2017, 05/07/2021   Tdap 07/28/2012, 06/10/2023   Patient Active Problem List   Diagnosis Date Noted   Type 2 diabetes mellitus with peripheral neuropathy (HCC) 08/08/2010    Priority: High   Peripheral autonomic neuropathy due to diabetes mellitus (HCC) 02/16/2015    Priority: Medium    Hyperlipidemia with target LDL less than 70 12/16/2010    Priority: Medium    Allergic rhinitis due to allergen 08/25/2011    Priority: Low   Migraine headache 08/08/2010    Priority: Low   Asthma 08/08/2010    Past Medical History:  Diagnosis Date   Acute idiopathic gout involving toe 06/22/2017   Allergic rhinitis due to allergen 08/25/2011   Asthma    GERD (gastroesophageal reflux disease)    Hyperlipidemia LDL goal < 70 12/16/2010   Migraine headache    Type 2 diabetes mellitus with peripheral neuropathy (HCC) 08/08/2010    Past Surgical History:  Procedure Laterality Date   ANKLE SURGERY     APPENDECTOMY  06/02/1998   CARPAL TUNNEL RELEASE     GANGLION CYST EXCISION     KNEE ARTHROSCOPY W/ ACL RECONSTRUCTION AND  PATELLA GRAFT     SHOULDER SURGERY Left     Family History  Problem Relation Age of Onset   Healthy Mother    Healthy Father    Heart disease Maternal Grandmother    ADD / ADHD Son    ADD / ADHD Son    Colon cancer Neg Hx    Colon polyps Neg Hx    Esophageal cancer Neg Hx    Stomach cancer Neg Hx    Rectal cancer Neg Hx     Social History   Social History Narrative   Not on file    Past  Medical History, Surgical History, Social History, Family History, Problem List, Medications, and Allergies have been reviewed and updated if relevant.  Review of Systems: Pertinent positives are listed above.  Otherwise, a full 14 point review of systems has been done in full and it is negative except where it is noted positive.  Objective:   There were no vitals taken for this visit. Ideal Body Weight:    Ideal Body Weight:   No results found.    09/25/2023   11:31 AM 06/10/2023    8:22 AM 04/17/2022    8:25 AM 06/27/2019    3:35 PM 01/19/2019    8:16 AM  Depression screen PHQ 2/9  Decreased Interest 0 1 0 0 0  Down, Depressed, Hopeless 0 0 0 0 0  PHQ - 2 Score 0 1 0 0 0  Altered sleeping  0     Tired, decreased energy  2     Change in appetite  2     Feeling bad or failure about yourself   2     Trouble concentrating  2     Moving slowly or fidgety/restless  2     Suicidal thoughts  0     PHQ-9 Score  11      Difficult doing work/chores  Not difficult at all        Data saved with a previous flowsheet row definition     GEN: well developed, well nourished, no acute distress Eyes: conjunctiva and lids normal, PERRLA, EOMI ENT: TM clear, nares clear, oral exam WNL Neck: supple, no lymphadenopathy, no thyromegaly, no JVD Pulm: clear to auscultation and percussion, respiratory effort normal CV: regular rate and rhythm, S1-S2, no murmur, rub or gallop, no bruits, peripheral pulses normal and symmetric, no cyanosis, clubbing, edema or varicosities GI: soft, non-tender; no hepatosplenomegaly, masses; active bowel sounds all quadrants GU: deferred Lymph: no cervical, axillary or inguinal adenopathy MSK: gait normal, muscle tone and strength WNL, no joint swelling, effusions, discoloration, crepitus  SKIN: clear, good turgor, color WNL, no rashes, lesions, or ulcerations Neuro: normal mental status, normal strength, sensation, and motion Psych: alert; oriented to person, place  and time, normally interactive and not anxious or depressed in appearance.  All labs reviewed with patient. Results for orders placed or performed in visit on 06/10/24  Hemoglobin A1c   Collection Time: 06/10/24  8:13 AM  Result Value Ref Range   Hgb A1c MFr Bld 5.7 4.6 - 6.5 %  Microalbumin / creatinine urine ratio   Collection Time: 06/10/24  8:13 AM  Result Value Ref Range   Microalb, Ur 1.8 0.7 - 1.9 mg/dL   Creatinine,U 776.1 mg/dL   Microalb Creat Ratio 8.2 0.0 - 30.0 mg/g  Basic metabolic panel   Collection Time: 06/10/24  8:13 AM  Result Value Ref Range   Sodium 138 135 - 145 mEq/L  Potassium 4.7 3.5 - 5.1 mEq/L   Chloride 104 96 - 112 mEq/L   CO2 28 19 - 32 mEq/L   Glucose, Bld 125 (H) 70 - 99 mg/dL   BUN 22 6 - 23 mg/dL   Creatinine, Ser 9.09 0.40 - 1.50 mg/dL   GFR 898.68 >39.99 mL/min   Calcium 9.6 8.4 - 10.5 mg/dL  CBC with Differential/Platelet   Collection Time: 06/10/24  8:13 AM  Result Value Ref Range   WBC 4.4 4.0 - 10.5 K/uL   RBC 5.75 4.22 - 5.81 Mil/uL   Hemoglobin 17.8 (H) 13.0 - 17.0 g/dL   HCT 48.9 60.9 - 47.9 %   MCV 88.7 78.0 - 100.0 fl   MCHC 34.9 30.0 - 36.0 g/dL   RDW 87.1 88.4 - 84.4 %   Platelets 208.0 150.0 - 400.0 K/uL   Neutrophils Relative % 64.1 43.0 - 77.0 %   Lymphocytes Relative 26.9 12.0 - 46.0 %   Monocytes Relative 8.1 3.0 - 12.0 %   Eosinophils Relative 0.8 0.0 - 5.0 %   Basophils Relative 0.1 0.0 - 3.0 %   Neutro Abs 2.8 1.4 - 7.7 K/uL   Lymphs Abs 1.2 0.7 - 4.0 K/uL   Monocytes Absolute 0.4 0.1 - 1.0 K/uL   Eosinophils Absolute 0.0 0.0 - 0.7 K/uL   Basophils Absolute 0.0 0.0 - 0.1 K/uL  Hepatic function panel   Collection Time: 06/10/24  8:13 AM  Result Value Ref Range   Total Bilirubin 1.1 0.2 - 1.2 mg/dL   Bilirubin, Direct 0.2 0.1 - 0.3 mg/dL   Alkaline Phosphatase 51 39 - 117 U/L   AST 17 5 - 37 U/L   ALT 28 3 - 53 U/L   Total Protein 6.8 6.0 - 8.3 g/dL   Albumin 4.6 3.5 - 5.2 g/dL  Lipid panel   Collection  Time: 06/10/24  8:13 AM  Result Value Ref Range   Cholesterol 131 28 - 200 mg/dL   Triglycerides 09.9 89.9 - 149.0 mg/dL   HDL 64.89 (L) >60.99 mg/dL   VLDL 81.9 0.0 - 59.9 mg/dL   LDL Cholesterol 78 10 - 99 mg/dL   Total CHOL/HDL Ratio 4    NonHDL 96.27     Assessment and Plan:     ICD-10-CM   1. Healthcare maintenance  Z00.00      Assessment & Plan   Health Maintenance Exam: The patient's preventative maintenance and recommended screening tests for an annual wellness exam were reviewed in full today. Brought up to date unless services declined.  Counselled on the importance of diet, exercise, and its role in overall health and mortality. The patient's FH and SH was reviewed, including their home life, tobacco status, and drug and alcohol status.  Follow-up in 1 year for physical exam or additional follow-up below.  Disposition: No follow-ups on file.  No orders of the defined types were placed in this encounter.  There are no discontinued medications. No orders of the defined types were placed in this encounter.   Signed,  Jacques DASEN. Missi Mcmackin, MD   Allergies as of 06/13/2024       Reactions   Codeine Itching        Medication List        Accurate as of June 12, 2024  9:30 AM. If you have any questions, ask your nurse or doctor.          albuterol  108 (90 Base) MCG/ACT inhaler Commonly known as: VENTOLIN  HFA Inhale  2 puffs into the lungs every 4 (four) to 6 (six)  hours as needed for cough/wheeze   Azelastine  HCl 137 MCG/SPRAY Soln Place 2 sprays into both nostrils 2 (two) times daily.   Azelastine  HCl 137 MCG/SPRAY Soln Place 1 spray into both nostrils 2 (two) times daily.   cephALEXin  500 MG capsule Commonly known as: KEFLEX  Take 1 capsule (500 mg total) by mouth 3 (three) times daily.   Contour Next EZ w/Device Kit Check blood sugar 2 times daily as needed   Contour Next Test test strip Generic drug: glucose blood Check blood sugar 2  times daily as needed   desloratadine  5 MG tablet Commonly known as: CLARINEX  Take 1 tablet (5 mg total) by mouth daily.   Dexcom G7 Sensor Misc Use 1 sensor as directed. Apply 1 sensor to skin for continuous sugar monitoring. Replace sensor every 10 days.   diclofenac  75 MG EC tablet Commonly known as: VOLTAREN  Take 1 tablet (75 mg total) by mouth 2 (two) times daily.   diclofenac  75 MG EC tablet Commonly known as: VOLTAREN  Take 1 tablet (75 mg total) by mouth 2 (two) times daily.   EPINEPHrine  0.3 mg/0.3 mL Soaj injection Commonly known as: EPI-PEN Inject 0.3 mg into the muscle as needed as directed for systemic reaction   EPINEPHrine  0.3 mg/0.3 mL Soaj injection Commonly known as: EPI-PEN Inject 0.3 mg into the muscle as needed for systemic reaction.   eszopiclone  1 MG Tabs tablet Commonly known as: LUNESTA  TAKE 1 TABLET BY MOUTH ONCE DAILY AT  BEDTIME  AS  NEEDED  FOR  SLEEP.  TAKE  IMMEDIATELY  BEFORE  BEDTIME   Farxiga  10 MG Tabs tablet Generic drug: dapagliflozin  propanediol Take 1 tablet (10 mg total) by mouth daily.   fluticasone  50 MCG/ACT nasal spray Commonly known as: FLONASE  Place 1-2 sprays into both nostrils daily.   fluticasone  50 MCG/ACT nasal spray Commonly known as: FLONASE  Place 1 spray into both nostrils daily during seasons of difficulty.   gabapentin  400 MG capsule Commonly known as: NEURONTIN  Take 1 capsule by mouth twice daily   gabapentin  400 MG capsule Commonly known as: NEURONTIN  Take 1 capsule (400 mg total) by mouth 2 (two) times daily.   ipratropium 0.06 % nasal spray Commonly known as: ATROVENT  Place 2 sprays into the nose 3 (three) times daily as needed.   ipratropium 0.06 % nasal spray Commonly known as: ATROVENT  Place 2 sprays into both nostrils 3 (three) times daily.   levocetirizine 5 MG tablet Commonly known as: XYZAL  Take 1 tablet (5 mg total) by mouth every evening.   meloxicam  15 MG tablet Commonly known as:  MOBIC  Take 1 tablet by mouth once daily   montelukast  10 MG tablet Commonly known as: SINGULAIR  Take 1 tablet (10 mg total) by mouth daily.   pravastatin  20 MG tablet Commonly known as: PRAVACHOL  Take 1 tablet (20 mg total) by mouth daily.   Qvar  RediHaler 40 MCG/ACT inhaler Generic drug: beclomethasone Inhale 1 puff into the lungs 2 (two) times daily.   tirzepatide  7.5 MG/0.5ML Pen Commonly known as: MOUNJARO  Inject 7.5 mg into the skin once a week.   Mounjaro  7.5 MG/0.5ML Pen Generic drug: tirzepatide  Inject 7.5 mg into the skin once a week.   tiZANidine  4 MG tablet Commonly known as: ZANAFLEX  TAKE 1 TABLET BY MOUTH AT BEDTIME AS NEEDED FOR MUSCLE SPASM       "

## 2024-06-13 ENCOUNTER — Ambulatory Visit: Admitting: Family Medicine

## 2024-06-13 VITALS — BP 110/70 | HR 77 | Temp 98.2°F | Ht 67.0 in | Wt 197.5 lb

## 2024-06-13 DIAGNOSIS — Z23 Encounter for immunization: Secondary | ICD-10-CM | POA: Diagnosis not present

## 2024-06-13 DIAGNOSIS — Z0000 Encounter for general adult medical examination without abnormal findings: Secondary | ICD-10-CM

## 2024-06-13 LAB — PSA, TOTAL WITH REFLEX TO PSA, FREE: PSA, Total: 0.7 ng/mL

## 2024-06-14 ENCOUNTER — Other Ambulatory Visit: Payer: Self-pay

## 2024-06-14 ENCOUNTER — Other Ambulatory Visit: Payer: Self-pay | Admitting: Family Medicine

## 2024-06-14 ENCOUNTER — Other Ambulatory Visit (HOSPITAL_COMMUNITY): Payer: Self-pay

## 2024-06-14 MED ORDER — ESZOPICLONE 1 MG PO TABS
1.0000 mg | ORAL_TABLET | Freq: Every evening | ORAL | 5 refills | Status: AC | PRN
  Filled 2024-06-14: qty 30, 30d supply, fill #0

## 2024-06-14 MED ORDER — DESLORATADINE 5 MG PO TABS
5.0000 mg | ORAL_TABLET | Freq: Every day | ORAL | 3 refills | Status: AC
  Filled 2024-06-14: qty 30, 30d supply, fill #0
  Filled 2024-07-04: qty 30, 30d supply, fill #1

## 2024-06-14 NOTE — Telephone Encounter (Signed)
 Last office visit 06/13/2024 for CPE. Last refilled 12/14/23 for #30 with no refills. Next Appt: No future appointments with PCP.

## 2024-07-04 ENCOUNTER — Other Ambulatory Visit (HOSPITAL_COMMUNITY): Payer: Self-pay

## 2024-07-04 ENCOUNTER — Other Ambulatory Visit: Payer: Self-pay | Admitting: Family Medicine

## 2024-07-05 ENCOUNTER — Other Ambulatory Visit (HOSPITAL_COMMUNITY): Payer: Self-pay

## 2024-07-05 ENCOUNTER — Other Ambulatory Visit: Payer: Self-pay

## 2024-07-05 MED ORDER — LEVOCETIRIZINE DIHYDROCHLORIDE 5 MG PO TABS
5.0000 mg | ORAL_TABLET | Freq: Every evening | ORAL | 3 refills | Status: AC
  Filled 2024-07-05: qty 90, 90d supply, fill #0

## 2024-07-05 MED ORDER — MONTELUKAST SODIUM 10 MG PO TABS
10.0000 mg | ORAL_TABLET | Freq: Every day | ORAL | 3 refills | Status: AC
  Filled 2024-07-05: qty 30, 30d supply, fill #0

## 2024-07-05 MED ORDER — PRAVASTATIN SODIUM 20 MG PO TABS
20.0000 mg | ORAL_TABLET | Freq: Every day | ORAL | 3 refills | Status: AC
  Filled 2024-07-05: qty 90, 90d supply, fill #0

## 2024-08-04 ENCOUNTER — Ambulatory Visit: Admitting: Internal Medicine
